# Patient Record
Sex: Female | Born: 1960
Health system: Southern US, Community
[De-identification: ages and names within clinical notes are randomized; demographics above are authoritative.]

## PROBLEM LIST (undated history)

## (undated) DIAGNOSIS — E7800 Pure hypercholesterolemia, unspecified: Secondary | ICD-10-CM

## (undated) DIAGNOSIS — N289 Disorder of kidney and ureter, unspecified: Secondary | ICD-10-CM

## (undated) DIAGNOSIS — I770 Arteriovenous fistula, acquired: Secondary | ICD-10-CM

## (undated) DIAGNOSIS — K219 Gastro-esophageal reflux disease without esophagitis: Secondary | ICD-10-CM

## (undated) DIAGNOSIS — F329 Major depressive disorder, single episode, unspecified: Secondary | ICD-10-CM

## (undated) DIAGNOSIS — B009 Herpesviral infection, unspecified: Secondary | ICD-10-CM

## (undated) DIAGNOSIS — E119 Type 2 diabetes mellitus without complications: Secondary | ICD-10-CM

## (undated) DIAGNOSIS — D649 Anemia, unspecified: Secondary | ICD-10-CM

## (undated) DIAGNOSIS — E785 Hyperlipidemia, unspecified: Secondary | ICD-10-CM

## (undated) DIAGNOSIS — M1990 Unspecified osteoarthritis, unspecified site: Secondary | ICD-10-CM

## (undated) DIAGNOSIS — E1129 Type 2 diabetes mellitus with other diabetic kidney complication: Secondary | ICD-10-CM

## (undated) DIAGNOSIS — J45909 Unspecified asthma, uncomplicated: Secondary | ICD-10-CM

## (undated) DIAGNOSIS — R001 Bradycardia, unspecified: Secondary | ICD-10-CM

## (undated) DIAGNOSIS — M549 Dorsalgia, unspecified: Secondary | ICD-10-CM

## (undated) DIAGNOSIS — M109 Gout, unspecified: Secondary | ICD-10-CM

## (undated) DIAGNOSIS — I10 Essential (primary) hypertension: Secondary | ICD-10-CM

## (undated) DIAGNOSIS — G4730 Sleep apnea, unspecified: Secondary | ICD-10-CM

## (undated) DIAGNOSIS — E550 Rickets, active: Secondary | ICD-10-CM

## (undated) DIAGNOSIS — F1990 Other psychoactive substance use, unspecified, uncomplicated: Secondary | ICD-10-CM

## (undated) DIAGNOSIS — R002 Palpitations: Secondary | ICD-10-CM

## (undated) DIAGNOSIS — R0602 Shortness of breath: Secondary | ICD-10-CM

## (undated) DIAGNOSIS — J189 Pneumonia, unspecified organism: Secondary | ICD-10-CM

## (undated) DIAGNOSIS — M2550 Pain in unspecified joint: Secondary | ICD-10-CM

## (undated) DIAGNOSIS — N185 Chronic kidney disease, stage 5: Secondary | ICD-10-CM

## (undated) DIAGNOSIS — N184 Chronic kidney disease, stage 4 (severe): Secondary | ICD-10-CM

## (undated) DIAGNOSIS — F32A Depression, unspecified: Secondary | ICD-10-CM

## (undated) DIAGNOSIS — F419 Anxiety disorder, unspecified: Secondary | ICD-10-CM

## (undated) HISTORY — PX: TUBAL LIGATION: SHX77

## (undated) HISTORY — DX: Type 2 diabetes mellitus without complications: E11.9

## (undated) HISTORY — PX: OTHER SURGICAL HISTORY: SHX169

## (undated) HISTORY — DX: Type 2 diabetes mellitus with other diabetic kidney complication: E11.29

## (undated) HISTORY — DX: Disorder of kidney and ureter, unspecified: N28.9

## (undated) HISTORY — DX: Dorsalgia, unspecified: M54.9

## (undated) HISTORY — DX: Chronic kidney disease, stage 5: N18.5

## (undated) HISTORY — DX: Rickets, active: E55.0

## (undated) HISTORY — DX: Pain in unspecified joint: M25.50

## (undated) HISTORY — DX: Arteriovenous fistula, acquired: I77.0

## (undated) HISTORY — DX: Shortness of breath: R06.02

## (undated) HISTORY — DX: Palpitations: R00.2

## (undated) HISTORY — DX: Pure hypercholesterolemia, unspecified: E78.00

## (undated) HISTORY — DX: Other psychoactive substance use, unspecified, uncomplicated: F19.90

---
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
## 1997-09-19 ENCOUNTER — Emergency Department (HOSPITAL_COMMUNITY): Admission: EM | Admit: 1997-09-19 | Discharge: 1997-09-19 | Payer: Self-pay | Admitting: Emergency Medicine

## 1998-03-11 ENCOUNTER — Emergency Department (HOSPITAL_COMMUNITY): Admission: EM | Admit: 1998-03-11 | Discharge: 1998-03-11 | Payer: Self-pay | Admitting: Emergency Medicine

## 1998-03-17 ENCOUNTER — Emergency Department (HOSPITAL_COMMUNITY): Admission: EM | Admit: 1998-03-17 | Discharge: 1998-03-17 | Payer: Self-pay | Admitting: Emergency Medicine

## 1998-10-17 ENCOUNTER — Emergency Department (HOSPITAL_COMMUNITY): Admission: EM | Admit: 1998-10-17 | Discharge: 1998-10-17 | Payer: Self-pay | Admitting: Emergency Medicine

## 1998-10-17 ENCOUNTER — Encounter: Payer: Self-pay | Admitting: Emergency Medicine

## 2002-11-09 ENCOUNTER — Ambulatory Visit (HOSPITAL_COMMUNITY): Admission: RE | Admit: 2002-11-09 | Discharge: 2002-11-09 | Payer: Self-pay | Admitting: Cardiology

## 2002-11-09 ENCOUNTER — Encounter: Payer: Self-pay | Admitting: Cardiology

## 2002-11-17 ENCOUNTER — Encounter: Payer: Self-pay | Admitting: Cardiology

## 2002-11-17 ENCOUNTER — Ambulatory Visit (HOSPITAL_COMMUNITY): Admission: RE | Admit: 2002-11-17 | Discharge: 2002-11-17 | Payer: Self-pay | Admitting: Cardiology

## 2004-04-11 ENCOUNTER — Emergency Department (HOSPITAL_COMMUNITY): Admission: EM | Admit: 2004-04-11 | Discharge: 2004-04-11 | Payer: Self-pay | Admitting: Family Medicine

## 2006-01-19 ENCOUNTER — Emergency Department (HOSPITAL_COMMUNITY): Admission: EM | Admit: 2006-01-19 | Discharge: 2006-01-19 | Payer: Self-pay | Admitting: Emergency Medicine

## 2006-12-11 ENCOUNTER — Ambulatory Visit (HOSPITAL_COMMUNITY): Admission: RE | Admit: 2006-12-11 | Discharge: 2006-12-11 | Payer: Self-pay | Admitting: Obstetrics and Gynecology

## 2010-05-05 ENCOUNTER — Encounter: Payer: Self-pay | Admitting: Obstetrics & Gynecology

## 2010-08-30 NOTE — Cardiovascular Report (Signed)
NAMEHADLEIGH, Margaret Aguilar                            ACCOUNT NO.:  [id number]   MEDICAL RECORD NO.:  [id number]                   PATIENT TYPE:  OIB   LOCATION:  2899                                 FACILITY:  [facility]   PHYSICIAN:  Allegra Lai. [name], M.D.              DATE OF BIRTH:  [birth year]   DATE OF PROCEDURE:  11/17/2002  DATE OF DISCHARGE:                              CARDIAC CATHETERIZATION   PROCEDURE:  1. Left cardiac catheterization.  2. Selective left and right coronary angiography.  3. Left ventriculography via right groin using Judkins technique.   INDICATIONS FOR PROCEDURE:  Ms. Napoletano is a 50 year old black female with  past medical history significant for hypertension, morbid obesity,  complaints of retrosternal squeezing chest pain when under stress associated  with nausea and feeling weak.  Also complains of exertional dyspnea with  minimal exertion.  Denies any PND, orthopnea, leg swelling.  Denies  palpitations, lightheadedness, or syncope.  The patient underwent Persantine  Cardiolite on July 28 which showed anteroseptal wall ischemia with EF of  52%.   PAST MEDICAL HISTORY:  As above.   PAST SURGICAL HISTORY:  She had a cesarean section approximately 13 years  ago with pelvic inflammatory disease at the age of 50.  Had questionable  tonsillar abscess at the age of 20.   MEDICATIONS:  1. Toprol 50 mg p.o. daily.  2. Lotrel 5/20 mg p.o. daily.  3. Baby aspirin 81 mg p.o. daily.   ALLERGIES:  LATEX.   SOCIAL HISTORY:  She is married.  Has three children.  No history of alcohol  abuse.  States she comes in contact with passive smoking.  She works as a  Automotive engineer.  Now goes to nursing school at [employer].   FAMILY HISTORY:  Father is alive.  He is 61.  Had coronary artery bypass  grafting at the age of 28.  Mother is diabetic.  She is alive.  Brother is  obese.  One sister is hypertensive.   PHYSICAL EXAMINATION:  GENERAL:   She was alert, oriented, awake x3 in no  acute distress.  VITAL SIGNS:  Blood pressure 160/110, pulse 64, regular.  HEENT:  Conjunctiva was pink.  NECK:  Supple.  No JVD.  No bruit.  LUNGS:  Clear to auscultation without rhonchi, rales.  CARDIOVASCULAR:  S1, S2 was normal.  There was a soft S4 gallop.  ABDOMEN:  Soft.  Bowel sounds were present, nontender.  There was no  abdominal bruit.  EXTREMITIES:  No clubbing, cyanosis, edema.   IMPRESSION:  1. New onset angina.  2. Positive Persantine Cardiolite.  3. Uncontrolled hypertension.  4. Morbid obesity.  5. Hypercholesterolemia.  6. History of passive smoking.  7. Positive family history of coronary artery disease.   Discussed with patient regarding results of stress Cardiolite and left  catheterization, possible PTCA/stenting, its risks death, MI, stroke, need  for emergency CABG, as well as restenosis, local vascular complications,  etc. and consented for PCI.   PROCEDURE:  After obtaining informed consent patient was brought to the  catheterization laboratory and was placed on fluoroscopy table.  Right groin  was prepped and draped in usual fashion.  2% Xylocaine was used for local  anesthesia in right groin.  With the help of thin wall needle 6-French  arterial sheath was placed.  The sheath was aspirated and flushed.  Next, 6-  French left Judkins catheter was advanced over the wire under fluoroscopic  guidance up to the ascending aorta where it was pulled out.  The catheter  was aspirated and connected to the manifold.  Catheter was further advanced  and engaged into left coronary ostium.  Multiple views of the left system  were taken.  Next, the catheter was disengaged and was pulled out over the  wire and was replaced with a 6-French right Judkins catheter which was  advanced over the wire under fluoroscopic guidance up to the ascending aorta  where it was pulled out.  The catheter was aspirated and connected to the   manifold.  Catheter was further advanced and engaged into the right coronary  ostium.  Multiple views of the right system were taken.  Next, the catheter  was disengaged and was pulled out over the wire and was replaced with 6-  French pigtail catheter which was advanced over the wire under fluoroscopic  guidance up to the ascending aorta where it was pulled out.  The catheter  was aspirated and connected to the manifold.  Catheter was further advanced  across the aortic valve into the LV.  LV pressures were recorded.  Next, LV  graph was done in 30 degree RAO position.  Post angiographic pressures were  recorded from LV and then pullback pressures recorded from aorta.  There was  no gradient across the aortic valve.  Next, the pigtail catheter was pulled  out over the wire.  Sheaths were aspirated and flushed.   FINDINGS:  LV showed good LV systolic function, LVH, EF of 60-65%.  Left  main was patent.  LAD has 10% ostial stenosis and 10-20% mid and distal  stenosis.  Diagonal 1 is large which is patent.  Left circumflex is large  which is patent.  OM 1 is very, very small which is less than 1 mm.  OM 2 is  large which is patent which gives off one branch in the mid portion,  therefore it bifurcates into superior and inferior branch.  RCA is small  which is nondominant which is patent.  The patient has left dominant system.  Arteriotomy was closed with Perclose without complications.  The patient  tolerated procedure well and was transferred to recovery room in stable  condition.                                               Allegra Lai. [name], M.D.    MNH/MEDQ  D:  11/17/2002  T:  11/17/2002  Job:  [id number]   cc:   Cath Lab

## 2011-06-05 DIAGNOSIS — M1990 Unspecified osteoarthritis, unspecified site: Secondary | ICD-10-CM | POA: Insufficient documentation

## 2011-06-05 DIAGNOSIS — K219 Gastro-esophageal reflux disease without esophagitis: Secondary | ICD-10-CM | POA: Insufficient documentation

## 2011-06-05 DIAGNOSIS — G4733 Obstructive sleep apnea (adult) (pediatric): Secondary | ICD-10-CM | POA: Insufficient documentation

## 2011-06-05 DIAGNOSIS — Z9989 Dependence on other enabling machines and devices: Secondary | ICD-10-CM | POA: Insufficient documentation

## 2011-06-05 DIAGNOSIS — E785 Hyperlipidemia, unspecified: Secondary | ICD-10-CM | POA: Insufficient documentation

## 2011-07-03 ENCOUNTER — Other Ambulatory Visit: Payer: Self-pay | Admitting: Cardiology

## 2011-07-10 ENCOUNTER — Other Ambulatory Visit: Payer: Self-pay | Admitting: Cardiology

## 2012-05-06 ENCOUNTER — Emergency Department (INDEPENDENT_AMBULATORY_CARE_PROVIDER_SITE_OTHER): Payer: Self-pay

## 2012-05-06 ENCOUNTER — Encounter (HOSPITAL_COMMUNITY): Payer: Self-pay | Admitting: *Deleted

## 2012-05-06 ENCOUNTER — Emergency Department (INDEPENDENT_AMBULATORY_CARE_PROVIDER_SITE_OTHER)
Admission: EM | Admit: 2012-05-06 | Discharge: 2012-05-06 | Disposition: A | Discharge status: Home / Self Care | Payer: Self-pay | Source: Home / Self Care | Attending: Emergency Medicine | Admitting: Emergency Medicine

## 2012-05-06 DIAGNOSIS — S335XXA Sprain of ligaments of lumbar spine, initial encounter: Secondary | ICD-10-CM

## 2012-05-06 DIAGNOSIS — S63609A Unspecified sprain of unspecified thumb, initial encounter: Secondary | ICD-10-CM

## 2012-05-06 DIAGNOSIS — S6390XA Sprain of unspecified part of unspecified wrist and hand, initial encounter: Secondary | ICD-10-CM

## 2012-05-06 HISTORY — DX: Type 2 diabetes mellitus without complications: E11.9

## 2012-05-06 HISTORY — DX: Essential (primary) hypertension: I10

## 2012-05-06 MED ORDER — NAPROXEN 500 MG PO TABS: 500.0000 mg | ORAL_TABLET | Freq: Two times a day (BID) | ORAL | Status: DC

## 2012-05-06 MED ORDER — CYCLOBENZAPRINE HCL 5 MG PO TABS: 5.0000 mg | ORAL_TABLET | Freq: Three times a day (TID) | ORAL | Status: DC | PRN
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
## 2012-11-30 ENCOUNTER — Other Ambulatory Visit: Payer: Self-pay | Admitting: Adult Health

## 2012-12-10 ENCOUNTER — Other Ambulatory Visit: Payer: Self-pay | Admitting: Adult Health

## 2013-09-12 ENCOUNTER — Other Ambulatory Visit (HOSPITAL_COMMUNITY): Payer: Self-pay | Admitting: Nephrology

## 2013-09-12 DIAGNOSIS — N289 Disorder of kidney and ureter, unspecified: Secondary | ICD-10-CM

## 2013-09-23 ENCOUNTER — Ambulatory Visit (HOSPITAL_COMMUNITY)
Admission: RE | Admit: 2013-09-23 | Discharge: 2013-09-23 | Disposition: A | Discharge status: Home / Self Care | Payer: BC Managed Care – PPO | Source: Ambulatory Visit | Attending: Nephrology | Admitting: Nephrology

## 2013-09-23 DIAGNOSIS — N1330 Unspecified hydronephrosis: Secondary | ICD-10-CM | POA: Insufficient documentation

## 2013-09-23 DIAGNOSIS — Q619 Cystic kidney disease, unspecified: Secondary | ICD-10-CM | POA: Insufficient documentation

## 2013-09-23 DIAGNOSIS — N289 Disorder of kidney and ureter, unspecified: Secondary | ICD-10-CM

## 2013-09-28 ENCOUNTER — Ambulatory Visit (INDEPENDENT_AMBULATORY_CARE_PROVIDER_SITE_OTHER): Payer: BC Managed Care – PPO | Admitting: Family Medicine

## 2013-09-28 VITALS — BP 124/82 | HR 70 | Temp 97.8°F | Resp 18 | Ht 61.0 in | Wt 258.0 lb

## 2013-09-28 DIAGNOSIS — N289 Disorder of kidney and ureter, unspecified: Secondary | ICD-10-CM

## 2013-09-28 DIAGNOSIS — Z9109 Other allergy status, other than to drugs and biological substances: Secondary | ICD-10-CM

## 2013-09-28 DIAGNOSIS — Z0000 Encounter for general adult medical examination without abnormal findings: Secondary | ICD-10-CM

## 2013-09-28 DIAGNOSIS — E119 Type 2 diabetes mellitus without complications: Secondary | ICD-10-CM

## 2013-09-28 DIAGNOSIS — R059 Cough, unspecified: Secondary | ICD-10-CM

## 2013-09-28 DIAGNOSIS — I10 Essential (primary) hypertension: Secondary | ICD-10-CM | POA: Insufficient documentation

## 2013-09-28 DIAGNOSIS — R05 Cough: Secondary | ICD-10-CM

## 2013-09-28 MED ORDER — FLUTICASONE PROPIONATE 50 MCG/ACT NA SUSP: 2.0000 | Freq: Every day | NASAL | Status: DC

## 2013-09-28 MED ORDER — ALBUTEROL SULFATE HFA 108 (90 BASE) MCG/ACT IN AERS: 2.0000 | INHALATION_SPRAY | Freq: Four times a day (QID) | RESPIRATORY_TRACT | Status: DC | PRN

## 2013-09-28 NOTE — Patient Instructions (Signed)

## 2013-09-28 NOTE — Progress Notes (Signed)
Patient ID: Margaret Aguilar MRN: [id number], DOB: 1960/06/04, 53 y.o. Date of Encounter: 09/28/2013, 3:09 PM  Primary Physician: Clent Demark, MD  Chief Complaint: Physical (CPE)  HPI: 54 y.o. y/o female with history of noted below here for CPE.   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  LMP: last week (first one in a year) Pap:  January MMG:  January Last Td: needed today Review of Systems: Consitutional: No fever, chills, fatigue, night sweats, lymphadenopathy, or weight changes. Eyes: No visual changes, eye redness, or discharge. ENT/Mouth: Ears: No otalgia, tinnitus, hearing loss, discharge. Nose:  Congestion present with rhinorrhea, sinus pain, but no epistaxis. Throat: No sore throat, post nasal drip, or teeth pain. Cardiovascular: No CP, palpitations, diaphoresis, DOE, edema, orthopnea, PND. Respiratory:  Cough, but no hemoptysis, SOB, or wheezing. Gastrointestinal: No anorexia, dysphagia, reflux, pain, nausea, hematemesis, diarrhea, constipation, BRBPR, or melena. Breast: No discharge, pain, swelling, or mass.  Vomiting yesterday. Genitourinary: No dysuria, frequency, urgency, hematuria, incontinence, nocturia, amenorrhea, vaginal discharge, pruritis, burning, abnormal bleeding, or pain. Musculoskeletal: No decreased ROM, myalgias, stiffness, joint swelling, or weakness. Skin: No rash, erythema, lesion changes, pain, warmth, jaundice, or pruritis. Neurological: No headache, dizziness, syncope, seizures, tremors, memory loss, coordination problems, or paresthesias. Psychological: No anxiety, depression, hallucinations, SI/HI. Endocrine: No fatigue, polydipsia, polyphagia, polyuria, or known diabetes. All other systems were reviewed and are otherwise negative.  Past Medical History  Diagnosis Date  . Diabetes mellitus without complication   . Hypertension      History reviewed. No pertinent past surgical history.  Home Meds:  Prior to Admission medications     Medication Sig Start Date End Date Taking? Authorizing Provider  amLODipine-benazepril (LOTREL) 10-20 MG per capsule Take 1 capsule by mouth daily.   Yes Historical Provider, MD  glimepiride (AMARYL) 1 MG tablet Take 1 mg by mouth daily before breakfast.   Yes Historical Provider, MD  metoprolol succinate (TOPROL-XL) 100 MG 24 hr tablet Take 100 mg by mouth daily. Take with or immediately following a meal.   Yes Historical Provider, MD    Allergies:  Allergies  Allergen Reactions  . Nutritional Supplements     walnut  . Latex     History   Social History  . Marital Status: Married    Spouse Name: N/A    Number of Children: N/A  . Years of Education: N/A   Occupational History  . Not on file.   Social History Main Topics  . Smoking status: Never Smoker   . Smokeless tobacco: Not on file  . Alcohol Use: No  . Drug Use: No  . Sexual Activity: No   Other Topics Concern  . Not on file   Social History Narrative  . No narrative on file    Family History  Problem Relation Age of Onset  . Diabetes Mother   . Hyperlipidemia Mother   . Hyperlipidemia Father   . Hypertension Father   . Stroke Brother   . Diabetes Brother     Physical Exam: Blood pressure 124/82, pulse 70, temperature 97.8 F (36.6 C), temperature source Oral, resp. rate 18, height 5\' 1"  (1.549 m), weight 258 lb (117.028 kg), SpO2 96.00%., Body mass index is 48.77 kg/(m^2). Wt Readings from Last 3 Encounters:  09/28/13 258 lb (117.028 kg)   BP Readings from Last 3 Encounters:  09/28/13 124/82  05/06/12 199/95   General: Well developed, well nourished, in no acute distress. HEENT: Normocephalic, atraumatic. Conjunctiva pink, sclera non-icteric. Pupils 2  mm constricting to 1 mm, round, regular, and equally reactive to light and accomodation. EOMI. Internal auditory canal clear. TMs with good cone of light and without pathology. Nasal mucosa pink. Nares are without discharge. No sinus tenderness. Oral  mucosa pink. Dentition fair condition. Pharynx without exudate.   Neck: Supple. Trachea midline. No thyromegaly. Full ROM. No lymphadenopathy. Lungs: Clear to auscultation bilaterally without wheezes, rales, or rhonchi. Breathing is of normal effort and unlabored. Cardiovascular: RRR with S1 S2. No murmurs, rubs, or gallops appreciated. Distal pulses 2+ symmetrically. No carotid or abdominal bruits. Abdomen: Soft, non-tender, non-distended with normoactive bowel sounds. No hepatosplenomegaly or masses. No rebound/guarding. No CVA tenderness. Without hernias.  Musculoskeletal: Full range of motion and 5/5 strength throughout. Without swelling, atrophy, tenderness, crepitus, or warmth. Extremities without clubbing, cyanosis, or edema. Calves supple. Skin: Warm and moist without erythema, ecchymosis, wounds, or rash. Neuro: A+Ox3. CN II-XII grossly intact. Moves all extremities spontaneously. Full sensation throughout. Normal gait. DTR 2+ throughout upper and lower extremities. Finger to nose intact. Psych:  Responds to questions appropriately with a normal affect.   No results found for this basename: CHOL, HDL, LDLCALC, LDLDIRECT, TRIG, CHOLHDL    Assessment/Plan:  53 y.o. y/o female here for CPE for school.  She recently had blood work done. She had eyes checked in January Colonoscopy is only preventative pending, patient needs to finish school. Needs Tb test. -  Signed, Robyn Haber, MD 09/28/2013 3:09 PM    Tuberculosis Risk Questionnaire  1. No Were you born outside the [place] in one of the following parts of the world: [place], [place], [place], [place] or [place]?    2. No Have you traveled outside the [place] and lived for more than one month in one of the following parts of the world: [place], [place], [place], [place] or [place]?    3. No Do you have a compromised immune system such as from any of the following conditions:HIV/AIDS, organ or bone  marrow transplantation, diabetes, immunosuppressive medicines (e.g. Prednisone, Remicaide), leukemia, lymphoma, cancer of the head or neck, gastrectomy or jejunal bypass, end-stage renal disease (on dialysis), or silicosis?     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    5. No Have you ever: injected illegal drugs, used crack cocaine, lived in a homeless shelter  or been in jail or prison?     6. No Have you ever been exposed to anyone with infectious tuberculosis?    Tuberculosis Symptom Questionnaire  Do you currently have any of the following symptoms?  1. No Unexplained cough lasting more than 3 weeks?   2. No Unexplained fever lasting more than 3 weeks.   3. No Night Sweats (sweating that leaves the bedclothes and sheets wet)     4. No Shortness of Breath   5. No Chest Pain   6. No Unintentional weight loss    7. No Unexplained fatigue (very tired for no reason)

## 2013-09-30 ENCOUNTER — Ambulatory Visit (INDEPENDENT_AMBULATORY_CARE_PROVIDER_SITE_OTHER): Payer: BC Managed Care – PPO | Admitting: Physician Assistant

## 2013-09-30 DIAGNOSIS — Z09 Encounter for follow-up examination after completed treatment for conditions other than malignant neoplasm: Secondary | ICD-10-CM

## 2013-09-30 LAB — TB SKIN TEST
Induration: 0 mm
TB Skin Test: NEGATIVE

## 2013-09-30 NOTE — Progress Notes (Signed)
Patient here for TB read only. Not seen by a provider.

## 2013-10-06 ENCOUNTER — Other Ambulatory Visit (HOSPITAL_COMMUNITY): Payer: Self-pay

## 2013-10-06 DIAGNOSIS — G4730 Sleep apnea, unspecified: Secondary | ICD-10-CM

## 2013-10-12 ENCOUNTER — Ambulatory Visit (INDEPENDENT_AMBULATORY_CARE_PROVIDER_SITE_OTHER): Payer: BC Managed Care – PPO | Admitting: *Deleted

## 2013-10-12 DIAGNOSIS — Z09 Encounter for follow-up examination after completed treatment for conditions other than malignant neoplasm: Secondary | ICD-10-CM

## 2013-10-12 DIAGNOSIS — Z111 Encounter for screening for respiratory tuberculosis: Secondary | ICD-10-CM

## 2013-10-12 NOTE — Addendum Note (Signed)
Addended byGrant Fontana R on: 10/12/2013 01:03 PM   Modules accepted: Level of Service

## 2013-10-12 NOTE — Progress Notes (Signed)
   Subjective:    Patient ID: Margaret Aguilar, female    DOB: [birth year], 53 y.o.   MRN: [id number]  HPI Pt here for second TB placement    Review of Systems     Objective:   Physical Exam        Assessment & Plan:

## 2013-10-15 ENCOUNTER — Ambulatory Visit (INDEPENDENT_AMBULATORY_CARE_PROVIDER_SITE_OTHER): Payer: BC Managed Care – PPO | Admitting: Physician Assistant

## 2013-10-15 DIAGNOSIS — Z09 Encounter for follow-up examination after completed treatment for conditions other than malignant neoplasm: Secondary | ICD-10-CM

## 2013-10-15 DIAGNOSIS — Z111 Encounter for screening for respiratory tuberculosis: Secondary | ICD-10-CM

## 2013-10-15 LAB — TB SKIN TEST
Induration: 0 mm
TB Skin Test: NEGATIVE

## 2013-10-15 NOTE — Progress Notes (Signed)
PPD reading was negative with an 0 mm induration.

## 2013-10-15 NOTE — Progress Notes (Deleted)
   Subjective:    Patient ID: Margaret Aguilar, female    DOB: 28-Jul-1960, 53 y.o.   MRN: [id number]  HPI    Review of Systems     Objective:   Physical Exam        Assessment & Plan:

## 2013-10-28 ENCOUNTER — Ambulatory Visit: Payer: BC Managed Care – PPO | Attending: Neurology | Admitting: Sleep Medicine

## 2013-10-28 VITALS — Ht 61.0 in | Wt 265.0 lb

## 2013-10-28 DIAGNOSIS — G4730 Sleep apnea, unspecified: Secondary | ICD-10-CM

## 2013-10-28 DIAGNOSIS — G4733 Obstructive sleep apnea (adult) (pediatric): Secondary | ICD-10-CM | POA: Insufficient documentation

## 2013-11-03 ENCOUNTER — Encounter (INDEPENDENT_AMBULATORY_CARE_PROVIDER_SITE_OTHER): Payer: Self-pay | Admitting: *Deleted

## 2013-11-09 NOTE — Sleep Study (Signed)
  [place] A. Merlene Laughter, MD     [email or web address]        NOCTURNAL POLYSOMNOGRAM    LOCATION: SLEEP LAB FACILITY: [facility]   PHYSICIAN: Amberleigh Gerken A. Merlene Laughter, M.D.   DATE OF STUDY: 10/28/2013.   REFERRING PHYSICIAN: [place].  INDICATIONS: This is a 53 year old presents with fatigue, snoring and witnessed apneas. There is also history of awakening with dyspnea and leg jerks.  MEDICATIONS:  Prior to Admission medications   Medication Sig Start Date End Date Taking? Authorizing Provider  albuterol (PROVENTIL HFA;VENTOLIN HFA) 108 (90 BASE) MCG/ACT inhaler Inhale 2 puffs into the lungs every 6 (six) hours as needed for wheezing or shortness of breath. 09/28/13   Robyn Haber, MD  amLODipine-benazepril (LOTREL) 10-20 MG per capsule Take 1 capsule by mouth daily.    Historical Provider, MD  fluticasone (FLONASE) 50 MCG/ACT nasal spray Place 2 sprays into both nostrils daily. 09/28/13   Robyn Haber, MD  glimepiride (AMARYL) 1 MG tablet Take 1 mg by mouth daily before breakfast.    Historical Provider, MD  metoprolol succinate (TOPROL-XL) 100 MG 24 hr tablet Take 100 mg by mouth daily. Take with or immediately following a meal.    Historical Provider, MD      EPWORTH SLEEPINESS SCALE: 7.   BMI: 50.   ARCHITECTURAL SUMMARY: Total recording time was 412 minutes. Sleep efficiency 80 %. Sleep latency 38 minutes. REM latency 102 minutes. Stage NI 5 %, N2 45 % and N3 31 % and REM sleep 18 %.    RESPIRATORY DATA:  Baseline oxygen saturation is 97 %. The lowest saturation is 74 %. The diagnostic AHI is 23. The RDI is 24. The REM AHI is 78.  LIMB MOVEMENT SUMMARY: PLM index 10.   ELECTROCARDIOGRAM SUMMARY: Average heart rate is 58 with no significant dysrhythmias observed.   IMPRESSION:  1. Moderate obstructive sleep apnea syndrome. A formal CPAP titration study is recommended.  Thanks for this referral.  Tobie Hellen A. Merlene Laughter, M.D. Diplomat, Tax adviser of Sleep  Medicine.

## 2013-12-07 ENCOUNTER — Institutional Professional Consult (permissible substitution) (INDEPENDENT_AMBULATORY_CARE_PROVIDER_SITE_OTHER): Payer: BC Managed Care – PPO | Admitting: Internal Medicine

## 2013-12-14 ENCOUNTER — Ambulatory Visit (INDEPENDENT_AMBULATORY_CARE_PROVIDER_SITE_OTHER): Payer: BC Managed Care – PPO | Admitting: Internal Medicine

## 2013-12-14 ENCOUNTER — Encounter (INDEPENDENT_AMBULATORY_CARE_PROVIDER_SITE_OTHER): Payer: Self-pay | Admitting: Internal Medicine

## 2013-12-14 ENCOUNTER — Other Ambulatory Visit (INDEPENDENT_AMBULATORY_CARE_PROVIDER_SITE_OTHER): Payer: Self-pay | Admitting: *Deleted

## 2013-12-14 ENCOUNTER — Telehealth (INDEPENDENT_AMBULATORY_CARE_PROVIDER_SITE_OTHER): Payer: Self-pay | Admitting: *Deleted

## 2013-12-14 VITALS — BP 156/80 | HR 60 | Temp 97.4°F | Ht 61.0 in | Wt 252.5 lb

## 2013-12-14 DIAGNOSIS — Z1211 Encounter for screening for malignant neoplasm of colon: Secondary | ICD-10-CM

## 2013-12-14 DIAGNOSIS — R195 Other fecal abnormalities: Secondary | ICD-10-CM

## 2013-12-14 DIAGNOSIS — D509 Iron deficiency anemia, unspecified: Secondary | ICD-10-CM

## 2013-12-14 MED ORDER — PEG-KCL-NACL-NASULF-NA ASC-C 100 G PO SOLR: 1.0000 | Freq: Once | ORAL | Status: DC

## 2013-12-14 NOTE — Patient Instructions (Signed)
Colonoscopy.  The risks and benefits such as perforation, bleeding, and infection were reviewed with the patient and is agreeable. 

## 2013-12-14 NOTE — Progress Notes (Signed)
   Subjective:    Patient ID: Margaret Aguilar, female    DOB: March 29, 1961, 53 y.o.   MRN: [id number]  HPI Referred by Dr. Hinda Lenis for IDA. Hx of CKD, stage 4.  Diabetic x 5 yrs.  She has never undergone a colonoscopy.  Appetite is good. No weight loss.  No abdominal pain. Usually has a BM once a day or every other day. No melena or BRRB. No change in her stools. She says her stools smell terrible which is new.  Recently started iron.  Presently going thru menopause  09/17/2013 Iron 24, TIBC 339, UIBC 311, Sat 8, H and H 11.9 and 36.7, MCV 72.2, Platelet ct 306. Glucose 106, BUN 2.65, Creatinine 4.1, Vitamin B12 796, Folate 13.6  Review of Systems Past Medical History  Diagnosis Date  . Diabetes mellitus without complication     x 5 yrs  . Hypertension   . Kidney disease     Stage 4    Past Surgical History  Procedure Laterality Date  . Cesarean section    . Tubal ligation      Allergies  Allergen Reactions  . Nutritional Supplements     walnut  . Latex     Current Outpatient Prescriptions on File Prior to Visit  Medication Sig Dispense Refill  . albuterol (PROVENTIL HFA;VENTOLIN HFA) 108 (90 BASE) MCG/ACT inhaler Inhale 2 puffs into the lungs every 6 (six) hours as needed for wheezing or shortness of breath.  1 Inhaler  11  . amLODipine-benazepril (LOTREL) 10-20 MG per capsule Take 1 capsule by mouth daily.      [name] glimepiride (AMARYL) 1 MG tablet Take 1 mg by mouth daily before breakfast.      . metoprolol succinate (TOPROL-XL) 100 MG 24 hr tablet Take 100 mg by mouth daily. Take with or immediately following a meal.       No current facility-administered medications on file prior to visit.        Objective:   Physical Exam Filed Vitals:   12/14/13 1025  BP: 156/80  Pulse: 60  Temp: 97.4 F (36.3 C)  Height: 5\' 1"  (1.549 m)  Weight: 252 lb 8 oz (114.533 kg)   Alert and oriented. Skin warm and dry. Oral mucosa is moist.   . Sclera anicteric, conjunctivae is  pink. Thyroid not enlarged. No cervical lymphadenopathy. Lungs clear. Heart regular rate and rhythm.  Abdomen is soft. Bowel sounds are positive. No hepatomegaly. No abdominal masses felt. No tenderness.  No edema to lower extremities. Stool brown and guaiac positive.       Assessment & Plan:  IDA. Guaiac positive stool. Colonic neoplasm, AVM, ulcer, polyp needs to be ruled out.  Plan: Colonoscopy.The risks and benefits such as perforation, bleeding, and infection were reviewed with the patient and is agreeable.

## 2013-12-14 NOTE — Telephone Encounter (Signed)
Patient needs movi prep 

## 2013-12-22 ENCOUNTER — Encounter (HOSPITAL_COMMUNITY): Payer: Self-pay | Admitting: Pharmacy Technician

## 2013-12-29 ENCOUNTER — Other Ambulatory Visit: Payer: Self-pay | Admitting: Nurse Practitioner

## 2013-12-29 ENCOUNTER — Ambulatory Visit
Admission: RE | Admit: 2013-12-29 | Discharge: 2013-12-29 | Disposition: A | Discharge status: Home / Self Care | Payer: BC Managed Care – PPO | Source: Ambulatory Visit | Attending: Nurse Practitioner | Admitting: Nurse Practitioner

## 2013-12-29 DIAGNOSIS — M25572 Pain in left ankle and joints of left foot: Secondary | ICD-10-CM

## 2013-12-29 DIAGNOSIS — M79675 Pain in left toe(s): Secondary | ICD-10-CM

## 2014-01-12 ENCOUNTER — Encounter (HOSPITAL_COMMUNITY)
Admission: RE | Disposition: A | Discharge status: Home / Self Care | Payer: Self-pay | Source: Ambulatory Visit | Attending: Internal Medicine

## 2014-01-12 ENCOUNTER — Encounter (HOSPITAL_COMMUNITY): Payer: Self-pay | Admitting: *Deleted

## 2014-01-12 ENCOUNTER — Ambulatory Visit (HOSPITAL_COMMUNITY)
Admission: RE | Admit: 2014-01-12 | Discharge: 2014-01-12 | Disposition: A | Discharge status: Home / Self Care | Payer: BC Managed Care – PPO | Source: Ambulatory Visit | Attending: Internal Medicine | Admitting: Internal Medicine

## 2014-01-12 DIAGNOSIS — Z91040 Latex allergy status: Secondary | ICD-10-CM | POA: Insufficient documentation

## 2014-01-12 DIAGNOSIS — I129 Hypertensive chronic kidney disease with stage 1 through stage 4 chronic kidney disease, or unspecified chronic kidney disease: Secondary | ICD-10-CM | POA: Insufficient documentation

## 2014-01-12 DIAGNOSIS — K635 Polyp of colon: Secondary | ICD-10-CM

## 2014-01-12 DIAGNOSIS — D509 Iron deficiency anemia, unspecified: Secondary | ICD-10-CM | POA: Diagnosis present

## 2014-01-12 DIAGNOSIS — K921 Melena: Secondary | ICD-10-CM

## 2014-01-12 DIAGNOSIS — E119 Type 2 diabetes mellitus without complications: Secondary | ICD-10-CM | POA: Insufficient documentation

## 2014-01-12 DIAGNOSIS — D123 Benign neoplasm of transverse colon: Secondary | ICD-10-CM | POA: Insufficient documentation

## 2014-01-12 DIAGNOSIS — N184 Chronic kidney disease, stage 4 (severe): Secondary | ICD-10-CM | POA: Diagnosis not present

## 2014-01-12 DIAGNOSIS — R195 Other fecal abnormalities: Secondary | ICD-10-CM

## 2014-01-12 DIAGNOSIS — Z91018 Allergy to other foods: Secondary | ICD-10-CM | POA: Insufficient documentation

## 2014-01-12 DIAGNOSIS — K5730 Diverticulosis of large intestine without perforation or abscess without bleeding: Secondary | ICD-10-CM

## 2014-01-12 HISTORY — PX: COLONOSCOPY: SHX5424

## 2014-01-12 SURGERY — COLONOSCOPY
Anesthesia: Moderate Sedation

## 2014-01-12 MED ORDER — MEPERIDINE HCL 50 MG/ML IJ SOLN
INTRAMUSCULAR | Status: DC
Start: 2014-01-12 — End: 2014-01-12
  Filled 2014-01-12: qty 1

## 2014-01-12 MED ORDER — SODIUM CHLORIDE 0.9 % IV SOLN
INTRAVENOUS | Status: DC
  Administered 2014-01-12: 1000 mL via INTRAVENOUS

## 2014-01-12 MED ORDER — MIDAZOLAM HCL 5 MG/5ML IJ SOLN
INTRAMUSCULAR | Status: DC | PRN
  Administered 2014-01-12: 2 mg via INTRAVENOUS
  Administered 2014-01-12: 1 mg via INTRAVENOUS
  Administered 2014-01-12 (×2): 2 mg via INTRAVENOUS

## 2014-01-12 MED ORDER — MIDAZOLAM HCL 5 MG/5ML IJ SOLN
INTRAMUSCULAR | Status: AC
  Filled 2014-01-12: qty 10

## 2014-01-12 MED ORDER — MEPERIDINE HCL 50 MG/ML IJ SOLN
INTRAMUSCULAR | Status: DC | PRN
  Administered 2014-01-12 (×3): 25 mg via INTRAVENOUS

## 2014-01-12 MED ORDER — STERILE WATER FOR IRRIGATION IR SOLN
Status: DC | PRN
  Administered 2014-01-12: 14:00:00

## 2014-01-12 NOTE — H&P (Signed)
Margaret Aguilar is an 53 y.o. female.   Chief Complaint: Patient is here for colonoscopy and possible EGD. HPI: Patient is 53 year old African female with history of diabetes mellitus and stage IV kidney disease who was found to have iron deficiency anemia and heme-positive stool. There is no history of melena rectal bleeding or abdominal pain. She has remote history of peptic ulcer disease possibly secondary to NSAIDs. She believes she has been tested for H. pylori does not know the result. Since she was found to have a kidney disease she is changing her eating habits and has lost 30 pounds this year. Family history is negative for CRC. Mother has history of colonic polyps.  Past Medical History  Diagnosis Date  . Diabetes mellitus without complication     x 5 yrs  . Hypertension   . Kidney disease     Stage 4    Past Surgical History  Procedure Laterality Date  . Cesarean section    . Tubal ligation      Family History  Problem Relation Age of Onset  . Diabetes Mother   . Hyperlipidemia Mother   . Hyperlipidemia Father   . Hypertension Father   . Stroke Brother   . Diabetes Brother    Social History:  reports that she has never smoked. She does not have any smokeless tobacco history on file. She reports that she does not drink alcohol or use illicit drugs.  Allergies:  Allergies  Allergen Reactions  . Nutritional Supplements     walnut  . Latex     Medications Prior to Admission  Medication Sig Dispense Refill  . albuterol (PROVENTIL HFA;VENTOLIN HFA) 108 (90 BASE) MCG/ACT inhaler Inhale 2 puffs into the lungs every 6 (six) hours as needed for wheezing or shortness of breath.  1 Inhaler  11  . amLODipine-benazepril (LOTREL) 10-20 MG per capsule Take 1 capsule by mouth daily.      . cholecalciferol (VITAMIN D) 1000 UNITS tablet Take 1,000 Units by mouth daily.      . ferrous sulfate 325 (65 FE) MG tablet Take by mouth daily with breakfast.      . glimepiride (AMARYL) 1 MG  tablet Take 1 mg by mouth daily before breakfast.      . hydrochlorothiazide (HYDRODIURIL) 25 MG tablet Take 25 mg by mouth daily.      . isosorbide mononitrate (IMDUR) 60 MG 24 hr tablet Take 60 mg by mouth daily.      . metoprolol succinate (TOPROL-XL) 100 MG 24 hr tablet Take 100 mg by mouth daily. Take with or immediately following a meal.      . peg 3350 powder (MOVIPREP) 100 G SOLR Take 1 kit (200 g total) by mouth once.  1 kit  0    No results found for this or any previous visit (from the past 48 hour(s)). No results found.  ROS  Blood pressure 169/84, temperature 97.8 F (36.6 C), temperature source Oral, resp. rate 22, height 5' 1.5" (1.562 m), weight 247 lb (112.038 kg), SpO2 96.00%. Physical Exam  Constitutional:  Pleasant well-developed mildly obese American female in NAD  HENT:  Mouth/Throat: Oropharynx is clear and moist.  Eyes: Conjunctivae are normal. No scleral icterus.  Neck: No thyromegaly present.  Cardiovascular: Normal rate, regular rhythm and normal heart sounds.   No murmur heard. Respiratory: Effort normal and breath sounds normal.  GI: Soft. She exhibits no distension and no mass. There is no tenderness.  Musculoskeletal: She exhibits no  edema.  Lymphadenopathy:    She has no cervical adenopathy.  Neurological: She is alert.  Skin: Skin is warm and dry.     Assessment/Plan Iron deficiency anemia. Heme positive stool. Diagnostic Colonoscopy, normal to be followed by esophagogastroduodenoscopy.  REHMAN,NAJEEB U 01/12/2014, 2:22 PM

## 2014-01-12 NOTE — Op Note (Signed)
[facility] [address] [place], [address]    COLONOSCOPY PROCEDURE REPORT     EXAM DATE: 2014-01-28  PATIENT NAME:      Margaret Aguilar, Margaret Aguilar           MR #:      [id number] BIRTHDATE:       04-10-1961      VISIT #:     [id number]  ATTENDING:     Hildred Laser, MD     STATUS:     outpatient REFERRING MD:      [name], M.D. ASA CLASS:        Class II  INDICATIONS:  The patient is  53 yr old female here for a colonoscopy due to iron deficiency anemia and heme-positive stool.  PROCEDURE PERFORMED:     Colonoscopy with snare polypectomy MEDICATIONS:     Cetacaine spray for oral pharyngeal topical anesthesia, Meperidine (Demerol) 75 mg IV, and Versed 7 mg IV  ESTIMATED BLOOD LOSS:     None  CONSENT: The patient understands the risks and benefits of the procedure and understands that these risks include, but are not limited to: sedation, allergic reaction, infection, perforation and/or bleeding. Alternative means of evaluation and treatment include, among others: physical exam, x-rays, and/or surgical intervention. The patient elects to proceed with this endoscopic procedure.  DESCRIPTION OF PROCEDURE: During intra-op preparation period all mechanical & medical equipment was checked for proper function. Hand hygiene and appropriate measures for infection prevention was taken. After the risks, benefits and alternatives of the procedure were thoroughly explained, Informed consent was verified, confirmed and timeout was successfully executed by the treatment team. A digital exam revealed no abnormalities of the rectum.      The [id number]) endoscope was introduced through the anus and advanced to the cecum, which was identified by the ileocecal valve. No adverse events experienced. The prep was excellent.. The instrument was then slowly withdrawn as the colon was fully examined.   COLON FINDINGS: A pedunculated polyp measuring 15 mm in size with  a friable surface was found at the hepatic flexure.  A polypectomy was performed using snare cautery.  The resection was complete, the polyp tissue was completely retrieved and sent to histology. 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     The scope was then completely withdrawn from the patient and the procedure terminated.  WITHDRAWAL TIME: 8 minutes 0 seconds    ADVERSE EVENTS:      There were no immediate complications.  IMPRESSIONS:     1.  15 mm  pedunculated polyp was found at the hepatic flexure; polypectomy was performed using snare cautery 2.  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  area behind the ileocecal valve not seen.  RECOMMENDATIONS:     1.  Hold aspirin, aspirin products, and anti-inflammatory medication for 1 week. 2.  Await biopsy results 3.  virtual colonoscopy in 4 weeks. RECALL:     Return in 5 years for Colonoscopy.  Hildred Laser, MD eSigned:  Hildred Laser, MD 28-Jan-2014 3:33 PM   cc:  CPT CODES: ICD CODES:  The ICD and CPT codes recommended by this software are interpretations from the data that the clinical staff has captured with the software.  The verification of the translation of this report to the ICD and CPT codes and modifiers is the sole responsibility of the health care institution and practicing physician where this report was generated.  [employer] will not be held  responsible for the validity of the ICD and CPT codes included on this report.  AMA assumes no liability for data contained or not contained herein. CPT is a Designer, television/film set of the [employer].   PATIENT NAME:  Margaret Aguilar, Margaret Aguilar MR#: [id number]

## 2014-01-12 NOTE — Discharge Instructions (Signed)
Colonoscopy, Care After Refer to this sheet in the next few weeks. These instructions provide you with information on caring for yourself after your procedure. Your health care provider may also give you more specific instructions. Your treatment has been planned according to current medical practices, but problems sometimes occur. Call your health care provider if you have any problems or questions after your procedure. WHAT TO EXPECT AFTER THE PROCEDURE  After your procedure, it is typical to have the following:  A small amount of blood in your stool.  Moderate amounts of gas and mild abdominal cramping or bloating. HOME CARE INSTRUCTIONS  Do not drive, operate machinery, or sign important documents for 24 hours.  You may shower and resume your regular physical activities, but move at a slower pace for the first 24 hours.  Take frequent rest periods for the first 24 hours.  Walk around or put a warm pack on your abdomen to help reduce abdominal cramping and bloating.  Drink enough fluids to keep your urine clear or pale yellow.  You may resume your normal diet as instructed by your health care provider. Avoid heavy or fried foods that are hard to digest.  Avoid drinking alcohol for 24 hours or as instructed by your health care provider.  Only take over-the-counter or prescription medicines as directed by your health care provider.  If a tissue sample (biopsy) was taken during your procedure:  Do not take aspirin or blood thinners for 7 days, or as instructed by your health care provider.  Do not drink alcohol for 7 days, or as instructed by your health care provider.  Eat soft foods for the first 24 hours. SEEK MEDICAL CARE IF: You have persistent spotting of blood in your stool 2-3 days after the procedure. SEEK IMMEDIATE MEDICAL CARE IF:  You have more than a small spotting of blood in your stool.  You pass large blood clots in your stool.  Your abdomen is swollen  (distended).  You have nausea or vomiting.  You have a fever.  You have increasing abdominal pain that is not relieved with medicine.   Colon Polyps Polyps are lumps of extra tissue growing inside the body. Polyps can grow in the large intestine (colon). Most colon polyps are noncancerous (benign). However, some colon polyps can become cancerous over time. Polyps that are larger than a pea may be harmful. To be safe, caregivers remove and test all polyps. CAUSES  Polyps form when mutations in the genes cause your cells to grow and divide even though no more tissue is needed. RISK FACTORS There are a number of risk factors that can increase your chances of getting colon polyps. They include:  Being older than 50 years.  Family history of colon polyps or colon cancer.  Long-term colon diseases, such as colitis or Crohn disease.  Being overweight.  Smoking.  Being inactive.  Drinking too much alcohol. SYMPTOMS  Most small polyps do not cause symptoms. If symptoms are present, they may include:  Blood in the stool. The stool may look dark red or black.  Constipation or diarrhea that lasts longer than 1 week. DIAGNOSIS People often do not know they have polyps until their caregiver finds them during a regular checkup. Your caregiver can use 4 tests to check for polyps:  Digital rectal exam. The caregiver wears gloves and feels inside the rectum. This test would find polyps only in the rectum.  Barium enema. The caregiver puts a liquid called barium into your  rectum before taking X-rays of your colon. Barium makes your colon look white. Polyps are dark, so they are easy to see in the X-ray pictures.  Sigmoidoscopy. A thin, flexible tube (sigmoidoscope) is placed into your rectum. The sigmoidoscope has a light and tiny camera in it. The caregiver uses the sigmoidoscope to look at the last third of your colon.  Colonoscopy. This test is like sigmoidoscopy, but the caregiver looks  at the entire colon. This is the most common method for finding and removing polyps. TREATMENT  Any polyps will be removed during a sigmoidoscopy or colonoscopy. The polyps are then tested for cancer. PREVENTION  To help lower your risk of getting more colon polyps:  Eat plenty of fruits and vegetables. Avoid eating fatty foods.  Do not smoke.  Avoid drinking alcohol.  Exercise every day.  Lose weight if recommended by your caregiver.  Eat plenty of calcium and folate. Foods that are rich in calcium include milk, cheese, and broccoli. Foods that are rich in folate include chickpeas, kidney beans, and spinach. HOME CARE INSTRUCTIONS Keep all follow-up appointments as directed by your caregiver. You may need periodic exams to check for polyps. SEEK MEDICAL CARE IF: You notice bleeding during a bowel movement.

## 2014-01-16 ENCOUNTER — Encounter (HOSPITAL_COMMUNITY): Payer: Self-pay | Admitting: Internal Medicine

## 2014-02-07 ENCOUNTER — Encounter (INDEPENDENT_AMBULATORY_CARE_PROVIDER_SITE_OTHER): Payer: Self-pay | Admitting: *Deleted

## 2014-02-07 ENCOUNTER — Other Ambulatory Visit (INDEPENDENT_AMBULATORY_CARE_PROVIDER_SITE_OTHER): Payer: Self-pay | Admitting: Internal Medicine

## 2014-02-07 DIAGNOSIS — Z86010 Personal history of colonic polyps: Secondary | ICD-10-CM

## 2014-02-07 DIAGNOSIS — D509 Iron deficiency anemia, unspecified: Secondary | ICD-10-CM

## 2014-02-07 DIAGNOSIS — R195 Other fecal abnormalities: Secondary | ICD-10-CM

## 2014-02-07 DIAGNOSIS — Z5309 Procedure and treatment not carried out because of other contraindication: Secondary | ICD-10-CM

## 2014-02-08 ENCOUNTER — Encounter (INDEPENDENT_AMBULATORY_CARE_PROVIDER_SITE_OTHER): Payer: Self-pay | Admitting: *Deleted

## 2014-02-08 NOTE — Telephone Encounter (Signed)
This encounter was created in error - please disregard.

## 2014-02-28 ENCOUNTER — Inpatient Hospital Stay: Admission: RE | Admit: 2014-02-28 | Payer: BC Managed Care – PPO | Source: Ambulatory Visit

## 2014-04-13 ENCOUNTER — Encounter (INDEPENDENT_AMBULATORY_CARE_PROVIDER_SITE_OTHER): Payer: Self-pay

## 2014-08-17 ENCOUNTER — Other Ambulatory Visit (HOSPITAL_COMMUNITY): Payer: Self-pay | Admitting: Internal Medicine

## 2014-08-17 DIAGNOSIS — N184 Chronic kidney disease, stage 4 (severe): Secondary | ICD-10-CM

## 2014-08-28 ENCOUNTER — Ambulatory Visit (HOSPITAL_COMMUNITY): Admission: RE | Admit: 2014-08-28 | Payer: BLUE CROSS/BLUE SHIELD | Source: Ambulatory Visit

## 2014-09-16 ENCOUNTER — Emergency Department (HOSPITAL_COMMUNITY)
Admission: EM | Admit: 2014-09-16 | Discharge: 2014-09-16 | Disposition: A | Discharge status: Home / Self Care | Payer: BLUE CROSS/BLUE SHIELD | Source: Home / Self Care | Attending: Emergency Medicine | Admitting: Emergency Medicine

## 2014-09-16 ENCOUNTER — Encounter (HOSPITAL_COMMUNITY): Payer: Self-pay

## 2014-09-16 DIAGNOSIS — W57XXXA Bitten or stung by nonvenomous insect and other nonvenomous arthropods, initial encounter: Secondary | ICD-10-CM | POA: Diagnosis not present

## 2014-09-16 DIAGNOSIS — T148 Other injury of unspecified body region: Secondary | ICD-10-CM | POA: Diagnosis not present

## 2014-09-16 MED ORDER — TRIAMCINOLONE ACETONIDE 0.1 % EX CREA: 1.0000 "application " | TOPICAL_CREAM | Freq: Two times a day (BID) | CUTANEOUS | Status: DC

## 2014-09-16 NOTE — ED Notes (Signed)
Insect bite-left forearm x 3 days

## 2014-09-16 NOTE — ED Provider Notes (Signed)
CSN: [id number]     Arrival date & time 09/16/14  1635 History   First MD Initiated Contact with Patient 09/16/14 1709     Chief Complaint  Patient presents with  . Insect Bite   (Consider location/radiation/quality/duration/timing/severity/associated sxs/prior Treatment) HPI Comments: 54 year old severely and morbidly obese female is complaining of itchy lesions to the left volar forearm. It started about 3 days ago. They are approximately 6-7 red annular raised lesions. This started out as small papules that has since increased in size. No other areas have been identified by the patient or seen by the examiner. Denies swelling, cough, trouble breathing, fever or systemic symptoms.   Past Medical History  Diagnosis Date  . Diabetes mellitus without complication     x 5 yrs  . Hypertension   . Kidney disease     Stage 4   Past Surgical History  Procedure Laterality Date  . Cesarean section    . Tubal ligation    . Colonoscopy N/A 01/12/2014    Procedure: COLONOSCOPY;  Surgeon: Rogene Houston, MD;  Location: [facility];  Service: Endoscopy;  Laterality: N/A;  225   Family History  Problem Relation Age of Onset  . Diabetes Mother   . Hyperlipidemia Mother   . Hyperlipidemia Father   . Hypertension Father   . Stroke Brother   . Diabetes Brother    History  Substance Use Topics  . Smoking status: Never Smoker   . Smokeless tobacco: Not on file  . Alcohol Use: No   OB History    No data available     Review of Systems  Constitutional: Negative.   HENT: Negative.   Respiratory: Negative.   Cardiovascular: Negative.   Gastrointestinal: Negative.   Skin: Positive for rash.  Neurological: Negative.   Psychiatric/Behavioral: The patient is nervous/anxious.     Allergies  Nutritional supplements and Latex  Home Medications   Prior to Admission medications   Medication Sig Start Date End Date Taking? Authorizing Provider  albuterol (PROVENTIL HFA;VENTOLIN HFA) 108  (90 BASE) MCG/ACT inhaler Inhale 2 puffs into the lungs every 6 (six) hours as needed for wheezing or shortness of breath. 09/28/13   Robyn Haber, MD  amLODipine-benazepril (LOTREL) 10-20 MG per capsule Take 1 capsule by mouth daily.    Historical Provider, MD  cholecalciferol (VITAMIN D) 1000 UNITS tablet Take 1,000 Units by mouth daily.    Historical Provider, MD  ferrous sulfate 325 (65 FE) MG tablet Take by mouth daily with breakfast.    Historical Provider, MD  glimepiride (AMARYL) 1 MG tablet Take 1 mg by mouth daily before breakfast.    Historical Provider, MD  hydrochlorothiazide (HYDRODIURIL) 25 MG tablet Take 25 mg by mouth daily. 11/24/13   Historical Provider, MD  isosorbide mononitrate (IMDUR) 60 MG 24 hr tablet Take 60 mg by mouth daily. 11/24/13   Historical Provider, MD  metoprolol succinate (TOPROL-XL) 100 MG 24 hr tablet Take 100 mg by mouth daily. Take with or immediately following a meal.    Historical Provider, MD  peg 3350 powder (MOVIPREP) 100 G SOLR Take 1 kit (200 g total) by mouth once. 12/14/13   Butch Penny, NP  triamcinolone cream (KENALOG) 0.1 % Apply 1 application topically 2 (two) times daily. 09/16/14   Janne Napoleon, NP   BP 177/120 mmHg  Pulse 70  Temp(Src) 97.4 F (36.3 C) (Oral)  SpO2 100% Physical Exam  Constitutional: She is oriented to person, place, and time. She appears  well-developed and well-nourished. No distress.  HENT:  Mouth/Throat: Oropharynx is clear and moist. No oropharyngeal exudate.  Eyes: Conjunctivae and EOM are normal.  Neck: Normal range of motion. Neck supple.  Cardiovascular: Normal rate, regular rhythm and normal heart sounds.   Pulmonary/Chest: Effort normal and breath sounds normal. No respiratory distress. She has no wheezes.  Neurological: She is alert and oriented to person, place, and time.  Skin: Skin is warm and dry. Rash noted.  Red, annular, mounded, raised, pruritic lesions numbering approximate 6-7 in a curvilinear  fashion to the left volar forearm.  Nursing note and vitals reviewed.   ED Course  Procedures (including critical care time) Labs Review Labs Reviewed - No data to display  Imaging Review No results found.   MDM   1. Insect bite    Triamcinolone cream and benadryl cream as directed. Cool compresses.     Janne Napoleon, NP 09/16/14 [phone]

## 2014-09-16 NOTE — Discharge Instructions (Signed)
Insect Bite Triamcinolone cream and benadryl cream as directed. Cool compresses. Mosquitoes, flies, fleas, bedbugs, and many other insects can bite. Insect bites are different from insect stings. A sting is when venom is injected into the skin. Some insect bites can transmit infectious diseases. SYMPTOMS  Insect bites usually turn red, swell, and itch for 2 to 4 days. They often go away on their own. TREATMENT  Your caregiver may prescribe antibiotic medicines if a bacterial infection develops in the bite. HOME CARE INSTRUCTIONS  Do not scratch the bite area.  Keep the bite area clean and dry. Wash the bite area thoroughly with soap and water.  Put ice or cool compresses on the bite area.  Put ice in a plastic bag.  Place a towel between your skin and the bag.  Leave the ice on for 20 minutes, 4 times a day for the first 2 to 3 days, or as directed.  You may apply a baking soda paste, cortisone cream, or calamine lotion to the bite area as directed by your caregiver. This can help reduce itching and swelling.  Only take over-the-counter or prescription medicines as directed by your caregiver.  If you are given antibiotics, take them as directed. Finish them even if you start to feel better. You may need a tetanus shot if:  You cannot remember when you had your last tetanus shot.  You have never had a tetanus shot.  The injury broke your skin. If you get a tetanus shot, your arm may swell, get red, and feel warm to the touch. This is common and not a problem. If you need a tetanus shot and you choose not to have one, there is a rare chance of getting tetanus. Sickness from tetanus can be serious. SEEK IMMEDIATE MEDICAL CARE IF:   You have increased pain, redness, or swelling in the bite area.  You see a red line on the skin coming from the bite.  You have a fever.  You have joint pain.  You have a headache or neck pain.  You have unusual weakness.  You have a  rash.  You have chest pain or shortness of breath.  You have abdominal pain, nausea, or vomiting.  You feel unusually tired or sleepy. MAKE SURE YOU:   Understand these instructions.  Will watch your condition.  Will get help right away if you are not doing well or get worse. Document Released: 05/08/2004 Document Revised: 06/23/2011 Document Reviewed: 10/30/2010 [employer] Patient Information 2015 [place], [place]. This information is not intended to replace advice given to you by your health care provider. Make sure you discuss any questions you have with your health care provider.  Bedbugs Bedbugs are tiny bugs that live in and around beds. During the day, they hide in mattresses and other places near beds. They come out at night and bite people lying in bed. They need blood to live and grow. Bedbugs can be found in beds anywhere. Usually, they are found in places where many people come and go (hotels, shelters, hospitals). It does not matter whether the place is dirty or clean. Getting bitten by bedbugs rarely causes a medical problem. The biggest problem can be getting rid of them. This often takes the work of a Financial risk analyst. CAUSES  Less use of pesticides. Bedbugs were common before the 1950s. Then, strong pesticides such as DDT nearly wiped them out. Today, these pesticides are not used because they harm the environment and can cause health problems.  More travel. Besides mattresses, bedbugs can also live in clothing and luggage. They can come along as people travel from place to place. Bedbugs are more common in certain parts of the world. When people travel to those areas, the bugs can come home with them.  Presence of birds and bats. Bedbugs often infest birds and bats. If you have these animals in or near your home, bedbugs may infest your house, too. SYMPTOMS It does not hurt to be bitten by a bedbug. You will probably not wake up when you are bitten. Bedbugs usually bite  areas of the skin that are not covered. Symptoms may show when you wake up, or they may take a day or more to show up. Symptoms may include:  Small red bumps on the skin. These might be lined up in a row or clustered in a group.  A darker red dot in the middle of red bumps.  Blisters on the skin. There may be swelling and very bad itching. These may be signs of an allergic reaction. This does not happen often. DIAGNOSIS Bedbug bites might look and feel like other types of insect bites. The bugs do not stay on the body like ticks or lice. They bite, drop off, and crawl away to hide. Your caregiver will probably:  Ask about your symptoms.  Ask about your recent activities and travel.  Check your skin for bedbug bites.  Ask you to check at home for signs of bedbugs. You should look for:  Spots or stains on the bed or nearby. This could be from bedbugs that were crushed or from their eggs or waste.  Bedbugs themselves. They are reddish-brown, oval, and flat. They do not fly. They are about the size of an apple seed.  Places to look for bedbugs include:  Beds. Check mattresses, headboards, box springs, and bed frames.  On drapes and curtains near the bed.  Under carpeting in the bedroom.  Behind electrical outlets.  Behind any wallpaper that is peeling.  Inside luggage. TREATMENT Most bedbug bites do not need treatment. They usually go away on their own in a few days. The bites are not dangerous. However, treatment may be needed if you have scratched so much that your skin has become infected. You may also need treatment if you are allergic to bedbug bites. Treatment options include:  A drug that stops swelling and itching (corticosteroid). Usually, a cream is rubbed on the skin. If you have a bad rash, you may be given a corticosteroid pill.  Oral antihistamines. These are pills to help control itching.  Antibiotic medicines. An antibiotic may be prescribed for infected  skin. HOME CARE INSTRUCTIONS   Take any medicine prescribed by your caregiver for your bites. Follow the directions carefully.  Consider wearing pajamas with long sleeves and pant legs.  Your bedroom may need to be treated. A pest control expert should make sure the bedbugs are gone. You may need to throw away mattresses or luggage. Ask the pest control expert what you can do to keep the bedbugs from coming back. Common suggestions include:  Putting a plastic cover over your mattress.  Washing and drying your clothes and bedding in hot water and a hot dryer. The temperature should be hotter than 120 F (48.9 C). Bedbugs are killed by high temperatures.  Vacuuming carefully all around your bed. Vacuum in all cracks and crevices where the bugs might hide. Do this often.  Carefully checking all used furniture, bedding, or  clothes that you bring into your house.  Eliminating bird nests and bat roosts.  If you get bedbug bites when traveling, check all your possessions carefully before bringing them into your house. If you find any bugs on clothes or in your luggage, consider throwing those items away. SEEK MEDICAL CARE IF:  You have red bug bites that keep coming back.  You have red bug bites that itch badly.  You have bug bites that cause a skin rash.  You have scratch marks that are red and sore. SEEK IMMEDIATE MEDICAL CARE IF: You have a fever. Document Released: 05/03/2010 Document Revised: 06/23/2011 Document Reviewed: 05/03/2010 [facility] Patient Information 2015 [place], [place]. This information is not intended to replace advice given to you by your health care provider. Make sure you discuss any questions you have with your health care provider.

## 2014-10-06 ENCOUNTER — Ambulatory Visit (HOSPITAL_COMMUNITY): Admission: RE | Admit: 2014-10-06 | Payer: BLUE CROSS/BLUE SHIELD | Source: Ambulatory Visit

## 2014-10-17 ENCOUNTER — Ambulatory Visit (HOSPITAL_COMMUNITY): Payer: BLUE CROSS/BLUE SHIELD

## 2014-11-06 ENCOUNTER — Ambulatory Visit (HOSPITAL_COMMUNITY)
Admission: RE | Admit: 2014-11-06 | Discharge: 2014-11-06 | Disposition: A | Discharge status: Home / Self Care | Payer: BLUE CROSS/BLUE SHIELD | Source: Ambulatory Visit | Attending: Internal Medicine | Admitting: Internal Medicine

## 2014-11-06 DIAGNOSIS — N184 Chronic kidney disease, stage 4 (severe): Secondary | ICD-10-CM | POA: Insufficient documentation

## 2015-09-17 ENCOUNTER — Other Ambulatory Visit: Payer: Self-pay | Admitting: Family Medicine

## 2016-06-11 ENCOUNTER — Ambulatory Visit (HOSPITAL_COMMUNITY)
Admission: EM | Admit: 2016-06-11 | Discharge: 2016-06-11 | Disposition: A | Discharge status: Home / Self Care | Payer: BLUE CROSS/BLUE SHIELD | Attending: Family Medicine | Admitting: Family Medicine

## 2016-06-11 ENCOUNTER — Encounter (HOSPITAL_COMMUNITY): Payer: Self-pay | Admitting: Emergency Medicine

## 2016-06-11 ENCOUNTER — Ambulatory Visit (INDEPENDENT_AMBULATORY_CARE_PROVIDER_SITE_OTHER): Payer: Self-pay

## 2016-06-11 DIAGNOSIS — M62830 Muscle spasm of back: Secondary | ICD-10-CM

## 2016-06-11 DIAGNOSIS — M79662 Pain in left lower leg: Secondary | ICD-10-CM

## 2016-06-11 HISTORY — DX: Chronic kidney disease, stage 4 (severe): N18.4

## 2016-06-11 MED ORDER — METHOCARBAMOL 500 MG PO TABS: 500.0000 mg | ORAL_TABLET | Freq: Three times a day (TID) | ORAL | 0 refills | Status: DC | PRN

## 2016-06-11 NOTE — ED Provider Notes (Signed)
[facility]  CSN: [id number] Arrival date & time: 06/11/16  1558   History   Chief Complaint: Left leg pain  HPI Margaret Aguilar is a 56 y.o. female presenting for left leg pain.   She's had 5 days of constant low back pain raditing down the back of the left leg to the calf noticed when trying to get out of bed in the morning. No injuries reported, though she'd recently used weights and walking on a treadmill for the first time the day before these symptoms. Pt denies any current bowel/bladder problems, fever, chills, unintentional weight loss, night time awakenings secondary to pain, weakness in one or both legs, though she has intermittent left leg numbness. She's not been to work yesterday or today due to pain. Hydrocodone has offered little improvement. Pain is worse with any standing or walking.   Past Medical History:  Diagnosis Date  . Diabetes mellitus without complication (HCC)    x 5 yrs  . Hypertension   . Kidney disease    Stage 4  . 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 IDA (iron deficiency anemia) 12/14/2013  . Guaiac + stool 12/14/2013  . Hypertension 09/28/2013  . Diabetes (HCC) 09/28/2013  . Renal insufficiency 09/28/2013  . Morbid obesity (HCC) 09/28/2013    Past Surgical History:  Procedure Laterality Date  . CESAREAN SECTION    . COLONOSCOPY N/A 01/12/2014   Procedure: COLONOSCOPY;  Surgeon: Najeeb U Rehman, MD;  Location: [facility];  Service: Endoscopy;  Laterality: N/A;  225  . TUBAL LIGATION      OB History    No data available       Home Medications    Prior to Admission medications   Medication Sig Start Date End Date Taking? Authorizing Provider  albuterol (PROVENTIL HFA;VENTOLIN HFA) 108 (90 BASE) MCG/ACT inhaler Inhale 2 puffs into the lungs every 6 (six) hours as needed for wheezing or shortness of breath. 09/28/13  Yes Kurt Lauenstein, MD    amLODipine-benazepril (LOTREL) 10-20 MG per capsule Take 1 capsule by mouth daily.   Yes Historical Provider, MD  cholecalciferol (VITAMIN D) 1000 UNITS tablet Take 1,000 Units by mouth daily.   Yes Historical Provider, MD  glimepiride (AMARYL) 1 MG tablet Take 1 mg by mouth daily before breakfast.   Yes Historical Provider, MD  hydrochlorothiazide (HYDRODIURIL) 25 MG tablet Take 25 mg by mouth daily. 11/24/13  Yes Historical Provider, MD  metoprolol succinate (TOPROL-XL) 100 MG 24 hr tablet Take 100 mg by mouth daily. Take with or immediately following a meal.   Yes Historical Provider, MD    Family History Family History  Problem Relation Age of Onset  . Diabetes Mother   . Hyperlipidemia Mother   . Hyperlipidemia Father   . Hypertension Father   . Stroke Brother   . Diabetes Brother     Social History Social History  Substance Use Topics  . Smoking status: Never Smoker  . Smokeless tobacco: Never Used  . Alcohol use No     Allergies   Nutritional supplements and Latex   Review of Systems Review of Systems Per HPI  Physical Exam Physical Exam BP 128/87 (BP Location: Left Wrist)   Pulse (!) 54   Temp 97.4 F (36.3 C) (Oral)   Resp 20   SpO2 99%  Gen: Obese well-appearing 56 y.o. female in NAD Back:  Normal   skin. Spine with scoliosis, otherwise no deformity, no step offs. No tenderness to vertebral process palpation. Lumbar paraspinous muscles spastic and tender bilaterally. Range of motion is full at neck and lumbar sacral regions. Straight leg raise is negative. Neuro:  Sensation intact bilaterally and motor function 5/5 bilateral lower extremities. No clonus at ankles.  UC Treatments / Results  Radiology No results found.  Procedures Procedures (including critical care time)  Medications Ordered in UC Medications - No data to display   Initial Impression / Assessment and Plan / UC Course  I have reviewed the triage vital signs and the nursing  notes.  Pertinent labs & imaging results that were available during my care of the patient were reviewed by me and considered in my medical decision making (see chart for details).    56 y.o. female with stage IV CKD, NIDDM, and morbid obesity presenting for low back pain radiating to left leg without neurological signs. XR performed as she initially reported decreased sensation in LLE compared to right. This demonstrated levoscoliosis, otherwise negative for spondylosis or fracture. No red flags. In the setting of recent abrupt change in activity level and paraspinal spasm, suspect muscle strain with predisposition from scoliosis. Advised to continue gradual return to activity. Will avoid steroids with DM and NSAIDs with CKD. Rx anti-spasmodic and follow up if pain persists for > 6 weeks or neurological symptoms develop.   Bradycardia noted, pt is asymptomatic and takes beta blocker.   Final Clinical Impressions(s) / UC Diagnoses   Final diagnoses:  None    New Prescriptions New Prescriptions   No medications on file     Ryan B Grunz, MD 06/11/16 1733  

## 2016-06-11 NOTE — Discharge Instructions (Signed)
Your x-ray showed that you have scoliosis which puts you at risk for muscle strains of your lower back. The muscle strain is causing your symptoms, so you should take tylenol and the muscle relaxer as needed. Symptoms should get better slowly over the next couple days. If you continue to have back pain for 6 weeks or you begin noticing leg weakness, return for care.

## 2016-06-11 NOTE — ED Triage Notes (Signed)
The patient presented to the [facility] with a complaint of left side leg pain that starts in her hip and runs down her left leg x 3 days. The patient denied any known injury but did report increased exercise recently.

## 2016-07-04 ENCOUNTER — Encounter (HOSPITAL_COMMUNITY): Payer: Self-pay | Admitting: Nurse Practitioner

## 2016-07-04 ENCOUNTER — Emergency Department (HOSPITAL_COMMUNITY): Payer: BLUE CROSS/BLUE SHIELD

## 2016-07-04 ENCOUNTER — Emergency Department (HOSPITAL_COMMUNITY)
Admission: EM | Admit: 2016-07-04 | Discharge: 2016-07-04 | Disposition: A | Discharge status: Home / Self Care | Payer: BLUE CROSS/BLUE SHIELD | Attending: Emergency Medicine | Admitting: Emergency Medicine

## 2016-07-04 DIAGNOSIS — Z79899 Other long term (current) drug therapy: Secondary | ICD-10-CM | POA: Insufficient documentation

## 2016-07-04 DIAGNOSIS — Y929 Unspecified place or not applicable: Secondary | ICD-10-CM | POA: Insufficient documentation

## 2016-07-04 DIAGNOSIS — Z91040 Latex allergy status: Secondary | ICD-10-CM | POA: Insufficient documentation

## 2016-07-04 DIAGNOSIS — W1839XA Other fall on same level, initial encounter: Secondary | ICD-10-CM | POA: Insufficient documentation

## 2016-07-04 DIAGNOSIS — E1122 Type 2 diabetes mellitus with diabetic chronic kidney disease: Secondary | ICD-10-CM | POA: Insufficient documentation

## 2016-07-04 DIAGNOSIS — Z7984 Long term (current) use of oral hypoglycemic drugs: Secondary | ICD-10-CM | POA: Insufficient documentation

## 2016-07-04 DIAGNOSIS — Y9389 Activity, other specified: Secondary | ICD-10-CM | POA: Insufficient documentation

## 2016-07-04 DIAGNOSIS — Y999 Unspecified external cause status: Secondary | ICD-10-CM | POA: Insufficient documentation

## 2016-07-04 DIAGNOSIS — I129 Hypertensive chronic kidney disease with stage 1 through stage 4 chronic kidney disease, or unspecified chronic kidney disease: Secondary | ICD-10-CM | POA: Insufficient documentation

## 2016-07-04 DIAGNOSIS — S99922A Unspecified injury of left foot, initial encounter: Secondary | ICD-10-CM | POA: Insufficient documentation

## 2016-07-04 DIAGNOSIS — N184 Chronic kidney disease, stage 4 (severe): Secondary | ICD-10-CM | POA: Insufficient documentation

## 2016-07-04 MED ORDER — OXYCODONE-ACETAMINOPHEN 5-325 MG PO TABS
1.0000 | ORAL_TABLET | Freq: Once | ORAL | Status: AC
  Administered 2016-07-04: 1 via ORAL
  Filled 2016-07-04: qty 1

## 2016-07-04 MED ORDER — OXYCODONE-ACETAMINOPHEN 5-325 MG PO TABS: 1.0000 | ORAL_TABLET | Freq: Four times a day (QID) | ORAL | 0 refills | Status: DC | PRN

## 2016-07-04 NOTE — ED Notes (Signed)
Ortho tech call to apply ASO and give crutches.

## 2016-07-04 NOTE — Progress Notes (Signed)
Orthopedic Tech Progress Note Patient Details:  Margaret Aguilar [birth year] [id number]  Ortho Devices Type of Ortho Device: ASO, Crutches Ortho Device/Splint Location: LLE Ortho Device/Splint Interventions: Ordered, Application   Braulio Bosch 07/04/2016, 6:17 PM

## 2016-07-04 NOTE — ED Provider Notes (Signed)
[place] DEPT Provider Note   CSN: [id number] Arrival date & time: 07/04/16  1239     History   Chief Complaint Chief Complaint  Patient presents with  . Back Pain  . Leg Pain    HPI Margaret Aguilar is a 56 y.o. female.  HPI  Pt presenting with c/o left foot pain and low back pain.  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  She states she was seen in urgent care and given robaxin and tylenol for back pain- told she had scoliosis on xray.  Since then she has continued to have pain in her left foot.  She has a lot of pain with bearing weight- she is hardly able to walk on her left foot.  The left foot remains tender and swollen after 3 weeks.  She states she has some back pain but this is only after standing for long periods.  No weakness of legs- but a lot of pain with trying to move the left foot.  She has had difficulty making it to the bathroom in time due to foot pain and has had to use a bedpan at home.  Denies knee pain.  There are no other associated systemic symptoms, there are no other alleviating or modifying factors.   Past Medical History:  Diagnosis Date  . Diabetes mellitus without complication ([place])    x 5 yrs  . Hypertension   . Kidney disease    Stage 4  . 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 IDA (iron deficiency anemia) 12/14/2013  . Guaiac + stool 12/14/2013  . Hypertension 09/28/2013  . Diabetes ([place]) 09/28/2013  . Renal insufficiency 09/28/2013  . Morbid obesity ([place]) 09/28/2013    Past Surgical History:  Procedure Laterality Date  . CESAREAN SECTION    . COLONOSCOPY N/A 01/12/2014   Procedure: COLONOSCOPY;  Surgeon: Rogene Houston, MD;  Location: [facility];  Service: Endoscopy;  Laterality: N/A;  225  . TUBAL LIGATION      OB History    No data available        Home Medications    Prior to Admission medications   Medication Sig Start Date End Date Taking? Authorizing Provider  albuterol (PROVENTIL HFA;VENTOLIN HFA) 108 (90 BASE) MCG/ACT inhaler Inhale 2 puffs into the lungs every 6 (six) hours as needed for wheezing or shortness of breath. 09/28/13   Robyn Haber, MD  amLODipine-benazepril (LOTREL) 10-20 MG per capsule Take 1 capsule by mouth daily.    Historical Provider, MD  cholecalciferol (VITAMIN D) 1000 UNITS tablet Take 1,000 Units by mouth daily.    Historical Provider, MD  glimepiride (AMARYL) 1 MG tablet Take 1 mg by mouth daily before breakfast.    Historical Provider, MD  hydrochlorothiazide (HYDRODIURIL) 25 MG tablet Take 25 mg by mouth daily. 11/24/13   Historical Provider, MD  methocarbamol (ROBAXIN) 500 MG tablet Take 1 tablet (500 mg total) by mouth every 8 (eight) hours as needed for muscle spasms. 06/11/16   Patrecia Pour, MD  metoprolol succinate (TOPROL-XL) 100 MG 24 hr tablet Take 100 mg by mouth daily. Take with or immediately following a meal.    Historical Provider, MD  oxyCODONE-acetaminophen (PERCOCET/ROXICET) 5-325 MG tablet Take 1-2 tablets by mouth every 6 (six) hours as needed  for severe pain. 07/04/16   Alfonzo Beers, MD    Family History Family History  Problem Relation Age of Onset  . Diabetes Mother   . Hyperlipidemia Mother   . Hyperlipidemia Father   . Hypertension Father   . Stroke Brother   . Diabetes Brother     Social History Social History  Substance Use Topics  . Smoking status: Never Smoker  . Smokeless tobacco: Never Used  . Alcohol use No     Allergies   Nutritional supplements and Latex   Review of Systems Review of Systems  ROS reviewed and all otherwise negative except for mentioned in HPI   Physical Exam Updated Vital Signs BP (!) 145/99   Pulse 62   Temp 98.5 F (36.9 C) (Oral)   Resp 18   SpO2 98%  Vitals reviewed Physical Exam Physical Examination: General  appearance - alert, well appearing, and in no distress Mental status - alert, oriented to person, place, and time Eyes - no conjunctival injection, no scleral icterus Chest - clear to auscultation, no wheezes, rales or rhonchi, symmetric air entry Heart - normal rate, regular rhythm, normal S1, S2, no murmurs, rubs, clicks or gallops Abdomen - soft, nontender, nondistended, no masses or organomegaly Back exam - no midline tenderness to palpation, mild left paraspinal tenderness to palpation, no CVA tenderness Neurological - alert, oriented x 3, strength in lower extremities 5/5 limited somewhat by pain in left foot, sensation intact in lower extremities Musculoskeletal - ttp over left lateral malleolus and left dorsum of foot, otherwise no joint tenderness, deformity or swelling Extremities - peripheral pulses normal, no pedal edema, no clubbing or cyanosis Skin - normal coloration and turgor, no rashes  ED Treatments / Results  Labs (all labs ordered are listed, but only abnormal results are displayed) Labs Reviewed - No data to display  EKG  EKG Interpretation None       Radiology Dg Foot Complete Left  Result Date: 07/04/2016 CLINICAL DATA:  Fall. EXAM: LEFT FOOT - COMPLETE 3+ VIEW COMPARISON:  12/29/2013 . FINDINGS: No acute bony or joint abnormality identified. No evidence of fracture or dislocation. Diffuse soft tissue swelling. IMPRESSION: Diffuse soft tissue swelling.  No acute bony abnormality. Electronically Signed   By: Marcello Moores  Register   On: 07/04/2016 17:03    Procedures Procedures (including critical care time)  Medications Ordered in ED Medications  oxyCODONE-acetaminophen (PERCOCET/ROXICET) 5-325 MG per tablet 1 tablet (1 tablet Oral Given 07/04/16 1646)     Initial Impression / Assessment and Plan / ED Course  I have reviewed the triage vital signs and the nursing notes.  Pertinent labs & imaging results that were available during my care of the patient were  reviewed by me and considered in my medical decision making (see chart for details).    6:25 PM pt has ASO applied by ortho tech, NVI intact toes after application- she is able to use crutches and move foot and leg appropriately now that foot is supported.  Plan for discharge with followup to orthopedics.  Discussed ice, elevation, pain control.  Discharged with strict return precautions.  Pt agreeable with plan.   Final Clinical Impressions(s) / ED Diagnoses   Final diagnoses:  Foot injury, left, initial encounter    New Prescriptions Discharge Medication List as of 07/04/2016  6:27 PM    START taking these medications   Details  oxyCODONE-acetaminophen (PERCOCET/ROXICET) 5-325 MG tablet Take 1-2 tablets by mouth every 6 (six) hours as needed for  severe pain., Starting Fri 07/04/2016, Print         Alfonzo Beers, MD 07/04/16 2136

## 2016-07-04 NOTE — Discharge Instructions (Signed)
Return to the ED with any concerns including increased pain, swelling/numbness/discoloration or any other alarming symptoms

## 2016-07-04 NOTE — ED Notes (Signed)
Ortho tech at bedside 

## 2016-07-04 NOTE — ED Triage Notes (Signed)
Pt presents with c/o back and L leg pain. The pain began 3 weeks ago after she fell. She was seen at [facility] and given robaxin and tylenol. She was told her pain was related to scoliosis, which she was unaware of having. She has tried the robaxin and tylenol with no relief. The pain has increased in severity this week and she' had 2 episodes of incontinence because she could not make it to the bathroom on time due to the pain.

## 2016-07-04 NOTE — ED Notes (Signed)
Patient transported to X-ray 

## 2016-09-18 DIAGNOSIS — E119 Type 2 diabetes mellitus without complications: Secondary | ICD-10-CM | POA: Insufficient documentation

## 2016-09-29 ENCOUNTER — Ambulatory Visit (INDEPENDENT_AMBULATORY_CARE_PROVIDER_SITE_OTHER): Payer: Self-pay | Admitting: Orthopaedic Surgery

## 2016-09-29 ENCOUNTER — Encounter (INDEPENDENT_AMBULATORY_CARE_PROVIDER_SITE_OTHER): Payer: Self-pay

## 2016-10-09 DIAGNOSIS — M47817 Spondylosis without myelopathy or radiculopathy, lumbosacral region: Secondary | ICD-10-CM | POA: Insufficient documentation

## 2016-12-23 DIAGNOSIS — M169 Osteoarthritis of hip, unspecified: Secondary | ICD-10-CM | POA: Insufficient documentation

## 2016-12-23 DIAGNOSIS — M5136 Other intervertebral disc degeneration, lumbar region: Secondary | ICD-10-CM | POA: Insufficient documentation

## 2016-12-23 DIAGNOSIS — M7062 Trochanteric bursitis, left hip: Secondary | ICD-10-CM | POA: Insufficient documentation

## 2017-01-09 DIAGNOSIS — N184 Chronic kidney disease, stage 4 (severe): Secondary | ICD-10-CM | POA: Insufficient documentation

## 2017-12-01 ENCOUNTER — Ambulatory Visit: Payer: Self-pay | Admitting: Family Medicine

## 2017-12-21 ENCOUNTER — Ambulatory Visit: Payer: Self-pay | Admitting: Family Medicine

## 2018-01-12 ENCOUNTER — Encounter: Payer: Self-pay | Admitting: Family Medicine

## 2018-01-12 ENCOUNTER — Ambulatory Visit: Payer: Self-pay | Attending: Family Medicine | Admitting: Family Medicine

## 2018-01-12 VITALS — BP 180/97 | HR 58 | Temp 98.2°F | Resp 18 | Ht 61.0 in | Wt 269.0 lb

## 2018-01-12 DIAGNOSIS — M545 Low back pain, unspecified: Secondary | ICD-10-CM

## 2018-01-12 DIAGNOSIS — E1122 Type 2 diabetes mellitus with diabetic chronic kidney disease: Secondary | ICD-10-CM | POA: Insufficient documentation

## 2018-01-12 DIAGNOSIS — I129 Hypertensive chronic kidney disease with stage 1 through stage 4 chronic kidney disease, or unspecified chronic kidney disease: Secondary | ICD-10-CM | POA: Insufficient documentation

## 2018-01-12 DIAGNOSIS — Z6841 Body Mass Index (BMI) 40.0 and over, adult: Secondary | ICD-10-CM | POA: Insufficient documentation

## 2018-01-12 DIAGNOSIS — E119 Type 2 diabetes mellitus without complications: Secondary | ICD-10-CM

## 2018-01-12 DIAGNOSIS — Z79899 Other long term (current) drug therapy: Secondary | ICD-10-CM | POA: Insufficient documentation

## 2018-01-12 DIAGNOSIS — N183 Chronic kidney disease, stage 3 unspecified: Secondary | ICD-10-CM

## 2018-01-12 DIAGNOSIS — G8929 Other chronic pain: Secondary | ICD-10-CM

## 2018-01-12 DIAGNOSIS — I10 Essential (primary) hypertension: Secondary | ICD-10-CM

## 2018-01-12 DIAGNOSIS — J4520 Mild intermittent asthma, uncomplicated: Secondary | ICD-10-CM

## 2018-01-12 DIAGNOSIS — M109 Gout, unspecified: Secondary | ICD-10-CM

## 2018-01-12 LAB — POCT GLYCOSYLATED HEMOGLOBIN (HGB A1C): HbA1c, POC (controlled diabetic range): 6.6 % (ref 0.0–7.0)

## 2018-01-12 LAB — GLUCOSE, POCT (MANUAL RESULT ENTRY): POC Glucose: 105 mg/dL — AB (ref 70–99)

## 2018-01-12 MED ORDER — GLUCOSE BLOOD VI STRP: ORAL_STRIP | 6 refills | Status: DC

## 2018-01-12 MED ORDER — HYDROCHLOROTHIAZIDE 25 MG PO TABS: 25.0000 mg | ORAL_TABLET | Freq: Every day | ORAL | 3 refills | Status: DC

## 2018-01-12 MED ORDER — METOPROLOL SUCCINATE ER 100 MG PO TB24: 100.0000 mg | ORAL_TABLET | Freq: Every day | ORAL | 5 refills | Status: DC

## 2018-01-12 MED ORDER — AMLODIPINE BESY-BENAZEPRIL HCL 10-20 MG PO CAPS: 1.0000 | ORAL_CAPSULE | Freq: Every day | ORAL | 5 refills | Status: DC

## 2018-01-12 MED ORDER — ALBUTEROL SULFATE HFA 108 (90 BASE) MCG/ACT IN AERS: 2.0000 | INHALATION_SPRAY | Freq: Four times a day (QID) | RESPIRATORY_TRACT | 11 refills | Status: DC | PRN

## 2018-01-12 MED ORDER — TRUE METRIX AIR GLUCOSE METER W/DEVICE KIT: 1.0000 | PACK | Freq: Two times a day (BID) | 0 refills | Status: DC

## 2018-01-12 MED ORDER — TRUEPLUS LANCETS 28G MISC: 6 refills | Status: DC

## 2018-01-12 MED FILL — ALBUTEROL SULFATE HFA 108 (: 108 (90 BAS | 30 days supply | Qty: 18 | Fill #0

## 2018-01-12 MED FILL — AMLODIPINE-BENAZEPRIL 10-20: 10-20 | 30 days supply | Qty: 30 | Fill #0

## 2018-01-12 MED FILL — !TRUE METRIX BLOOD GLUCOSE: 1 days supply | Qty: 1 | Fill #0

## 2018-01-12 MED FILL — METOPROLOL SUCCINATE ER 100: 100 | 30 days supply | Qty: 30 | Fill #0

## 2018-01-12 MED FILL — TRUE METRIX TEST STRIP: 50 days supply | Qty: 100 | Fill #0

## 2018-01-12 MED FILL — TRUEplus LANCETS 28G MISC: 50 days supply | Qty: 100 | Fill #0

## 2018-01-12 MED FILL — HYDROCHLOROTHIAZIDE 25 MG T: 25 | 30 days supply | Qty: 30 | Fill #0

## 2018-01-12 NOTE — Progress Notes (Signed)
Subjective:    Patient ID: Margaret Aguilar, female    DOB: May 12, 1960, 57 y.o.   MRN: [id number]  HPI 57 year old female new to the practice.  Patient reports past medical history significant for type 2 diabetes, hypertension, chronic kidney disease, gout, chronic low back pain with prior diagnosis of scoliosis and asthma.  Patient states that she was taken off of her Amaryl for treatment of her diabetes within the past 2 years as she states that she was told that her hemoglobin A1c was good.  Patient cannot recall her last hemoglobin A1c. Patient does not have a glucometer to check her blood sugars at home.   Patient also states that about 2 years ago she was working as a Technical brewer, Physiological scientist, and she came out of the room of the patient and collapsed onto the floor.  Patient states that she could not use her legs for about 7 months.  Patient states that she initially was seen at the emergency department at [facility] and they were not sure what was causing her to be unable to use her legs.  Patient states that she lived in [place] in the past and was still active at a practice associated with [employer] and her family drove her to that practice.  Patient states she was initially diagnosed with scoliosis.  Patient states that she spent about 7 months mostly bedbound.  Patient states that initially she did have some radiation of pain down her right leg but this has gone away.  Patient however has been unable to return to work.      Patient has not taken her medication for hypertension at today's visit.  Patient has had some occasional mild headaches when she has not taken her blood pressure medication.  Patient states that she is almost out of her blood pressure medicine.  Patient states that she was also diagnosed with stage III or IV chronic kidney disease.  Patient has paperwork with her from [facility] and patient had a creatinine of 2.8 in September 2018.  Patient also  reports a history of gout but states that she is not currently on any medication to treat her gout due to her renal function.  Patient denies any recent flareups of gout.  Patient did have onset within the past 2 weeks of some pain in her right knee.  Patient states her knee is no longer painful except with touching the outside portion of her left knee.  Patient also with chronic low back pain that consist of a dull aching sensation, that is about a 5 or 6 on a 0-to-10 scale and currently does not have any radiation of pain.  Patient takes Tylenol as needed for back pain.  Patient also states that she needs a new albuterol inhaler for treatment of asthma.  Patient states that her asthma is triggered by stress and environmental exposure such as smoke.  Patient denies any nighttime awakening secondary to shortness of breath or cough.  Patient does not avoid activity secondary to fear of a asthma exacerbation and patient has had no hospitalizations related to her asthma.        Patient reports family history significant for her father dying last year from a heart attack and her father also had prior stroke and hypertension.  Patient reports that her mother has diabetes.  No family history of cancer.  Patient surgical history consist of a prior D&C after miscarriage of twins, as well as a c-section and  tubal ligation.   Patient reports that she does not smoke.  Patient is married.  Patient is currently not employed secondary to her health. Past Medical History:  Diagnosis Date  . Diabetes mellitus without complication ([place])    x 5 yrs  . Hypertension   . Kidney disease    Stage 4  . 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 CESAREAN SECTION    . COLONOSCOPY N/A 01/12/2014   Procedure: COLONOSCOPY;  Surgeon: Rogene Houston, MD;  Location[facility];  Service: Endoscopy;  Laterality: N/A;  225  . TUBAL LIGATION      Family History  Problem Relation Age of Onset  . Diabetes Mother   . Hyperlipidemia Mother   . Hyperlipidemia Father   . Hypertension Father   . Stroke Brother   . Diabetes Brother    Social History   Tobacco Use  . Smoking status: Never Smoker  . Smokeless tobacco: Never Used  Substance Use Topics  . Alcohol use: No  . Drug use: No   Allergies  Allergen Reactions  . Nutritional Supplements     walnut  . Latex      Review of Systems  Constitutional: Positive for fatigue. Negative for chills and fever.  HENT: Positive for congestion. Negative for postnasal drip, rhinorrhea, sinus pressure, sinus pain, sore throat and trouble swallowing.   Respiratory: Negative for cough and shortness of breath.   Cardiovascular: Negative for chest pain, palpitations and leg swelling.  Gastrointestinal: Positive for nausea. Negative for abdominal pain and blood in stool.  Endocrine: Negative for polydipsia, polyphagia and polyuria.  Genitourinary: Negative for dysuria and frequency.  Musculoskeletal: Positive for arthralgias (Occasionally due to gout but not at today's visit) and back pain. Negative for gait problem.  Neurological: Negative for dizziness and headaches.  Hematological: Negative for adenopathy. Does not bruise/bleed easily.  Psychiatric/Behavioral: Negative for suicidal ideas. The patient is nervous/anxious.        Objective:   Physical Exam BP (!) 180/97 (BP Location: Left Arm, Patient Position: Sitting, Cuff Size: Normal)   Pulse (!) 58   Temp 98.2 F (36.8 C) (Oral)   Resp 18   Ht _0  (1.549 m)   Wt 269 lb (122 kg)   SpO2 97%   BMI 50.83 kg/m Vital signs and nurse's note reviewed General-well-nourished, well-developed obese older female in no acute distress ENT-TMs gray, nares with moderate edema of the nasal turbinates, patient with mild posterior pharynx erythema, patient with a narrowed posterior airway secondary to body habitus and large tongue  base Neck-supple, no lymphadenopathy, no thyromegaly, no carotid bruit Cardiovascular-regular rate and rhythm Abdomen-truncal obesity, soft and nontender Back- no CVA tenderness, patient with lordosis, scoliosis cannot be determined secondary to body habitus, patient with tenderness from L4-S1 area Extremities- mild, nonpitting distal lower extremity edema bilaterally Musculoskeletal- patient with left lateral knee joint line tenderness, right knee is nontender.  Both knee joints are stable. Diabetic foot exam- mild hammertoe deformities of toes 3 and 4 bilaterally.  Patient with a large, nontender bunion on the right foot.  No active skin breakdown on the feet.  Patient does have calluses on the bilateral heels as well as dry skin.  Patient with testing of 10 out of 10 areas on the foot with monofilament however patient did not sense monofilament on either heel.  Patient with 2+ bilateral dorsalis pedis pulses and 1+  posterior tibial pulses.  No significant changes in the nails and some toenails are slightly long.      Assessment & Plan:  1. Type 2 diabetes mellitus without complication, without long-term current use of insulin (HCC) Patient's hemoglobin A1c at today's visit was very good at 6.6 and patient will continue dietary control of diabetes.  Patient will also have a CMP and lipid panel at today's visit and patient was given a prescription for diabetic testing supplies.  Diabetic foot care was discussed as patient with calluses on the heels which caused absence of sensation during monofilament exam. - HgB A1c - Glucose (CBG) - Comprehensive metabolic panel - Lipid panel - Blood Glucose Monitoring Suppl (TRUE METRIX AIR GLUCOSE METER) w/Device KIT; 1 Device by Does not apply route 2 (two) times daily at 8 am and 10 pm.  Dispense: 1 kit; Refill: 0 - glucose blood (TRUE METRIX BLOOD GLUCOSE TEST) test strip; Use as instructed to check blood sugars twice per day  Dispense: 100 each; Refill:  6 - TRUEPLUS LANCETS 28G MISC; Use to check blood sugar twice per day  Dispense: 100 each; Refill: 6 - TSH  2. Essential hypertension Patient's blood pressure was highly elevated at today's visit but patient reports that she did not take her medications prior to today's visit.  Patient is given refill of her amlodipine benazepril, Toprol and hydrochlorothiazide.  Importance of compliance with blood pressure medications discussed.  Patient had stated in the exam room that she was on spironolactone as well however this medication in combination with the benazepril could cause dangerous elevations in potassium level therefore patient's hydrochlorothiazide will be continued.  Patient apparently had not mentioned the spironolactone to the CMA and Spironolactone was never placed on patient's medication list.  Patient has been asked to return to clinic in approximately 2 weeks for follow-up/blood pressure recheck with the clinical pharmacist. - Lipid panel - amLODipine-benazepril (LOTREL) 10-20 MG capsule; Take 1 capsule by mouth daily.  Dispense: 30 capsule; Refill: 5 - metoprolol succinate (TOPROL-XL) 100 MG 24 hr tablet; Take 1 tablet (100 mg total) by mouth daily. Take with or immediately following a meal.  Dispense: 30 tablet; Refill: 5 - hydrochlorothiazide (HYDRODIURIL) 25 MG tablet; Take 1 tablet (25 mg total) by mouth daily.  Dispense: 30 tablet; Refill: 3  3. Chronic midline low back pain, unspecified whether sciatica present Patient with complaint of chronic issues with low back pain.  Weight loss is encouraged and patient may take Tylenol as needed for pain but should avoid the use of nonsteroidal anti-inflammatories due to her chronic kidney disease  4. Stage 3 chronic kidney disease ([place]) Patient reports history of chronic kidney disease and does have past labs showing a creatinine of 2.8 done in September of last year.  Patient will have recheck of her creatinine level and EGFR as well as CBC  to look for anemia. - Comprehensive metabolic panel - CBC with Differential  5. Gout, unspecified cause, unspecified chronicity, unspecified site Patient reports that she has had issues with gout but cannot take medication for prevention secondary to her chronic kidney disease.  Patient reports gout flareups approximately 3-4 times per year but does not have any evidence of active gout at today's visit.  Patient will have uric acid level and low purine diet as recommended as well as remaining hydrated - Uric Acid  6. Morbid obesity ([place]) Patient will have lipid panel and TSH done in follow-up of her obesity and patient is encouraged to  remain as active as possible and to follow a low carbohydrate, low-fat diet with goal of weight loss - Lipid panel - TSH   7. Mild intermittent asthma without complication Patient reports that she has asthma which appears to be mild and intermittent.  Patient is provided with refill of her albuterol inhaler.  Patient did receive influenza immunization earlier today. - albuterol (PROVENTIL HFA;VENTOLIN HFA) 108 (90 Base) MCG/ACT inhaler; Inhale 2 puffs into the lungs every 6 (six) hours as needed for wheezing or shortness of breath.  Dispense: 1 Inhaler; Refill: 11  *Patient received her influenza immunization at the flu shot clinic conducted by pharmacy today prior to her appointment  An After Visit Summary was printed and given to the patient.  Return in about 5 weeks (around 02/16/2018) for BP-2 weeks with CPP; .

## 2018-01-13 LAB — CBC WITH DIFFERENTIAL/PLATELET
Basophils Absolute: 0 x10E3/uL (ref 0.0–0.2)
Basos: 0 %
EOS (ABSOLUTE): 0.2 x10E3/uL (ref 0.0–0.4)
Eos: 2 %
Hematocrit: 36 % (ref 34.0–46.6)
Hemoglobin: 11.1 g/dL (ref 11.1–15.9)
Immature Grans (Abs): 0 x10E3/uL (ref 0.0–0.1)
Immature Granulocytes: 0 %
Lymphocytes Absolute: 2.3 x10E3/uL (ref 0.7–3.1)
Lymphs: 26 %
MCH: 22.4 pg — ABNORMAL LOW (ref 26.6–33.0)
MCHC: 30.8 g/dL — ABNORMAL LOW (ref 31.5–35.7)
MCV: 73 fL — ABNORMAL LOW (ref 79–97)
Monocytes Absolute: 0.6 x10E3/uL (ref 0.1–0.9)
Monocytes: 6 %
Neutrophils Absolute: 5.8 x10E3/uL (ref 1.4–7.0)
Neutrophils: 66 %
Platelets: 320 x10E3/uL (ref 150–450)
RBC: 4.96 x10E6/uL (ref 3.77–5.28)
RDW: 17 % — ABNORMAL HIGH (ref 12.3–15.4)
WBC: 8.8 x10E3/uL (ref 3.4–10.8)

## 2018-01-13 LAB — TSH: TSH: 1.22 u[IU]/mL (ref 0.450–4.500)

## 2018-01-13 LAB — URIC ACID: Uric Acid: 9.5 mg/dL — ABNORMAL HIGH (ref 2.5–7.1)

## 2018-01-13 LAB — COMPREHENSIVE METABOLIC PANEL WITH GFR
ALT: 14 IU/L (ref 0–32)
AST: 23 IU/L (ref 0–40)
Albumin/Globulin Ratio: 1.3 (ref 1.2–2.2)
Albumin: 4.1 g/dL (ref 3.5–5.5)
Alkaline Phosphatase: 143 IU/L — ABNORMAL HIGH (ref 39–117)
BUN/Creatinine Ratio: 10 (ref 9–23)
BUN: 28 mg/dL — ABNORMAL HIGH (ref 6–24)
Bilirubin Total: 0.2 mg/dL (ref 0.0–1.2)
CO2: 19 mmol/L — ABNORMAL LOW (ref 20–29)
Calcium: 9.4 mg/dL (ref 8.7–10.2)
Chloride: 109 mmol/L — ABNORMAL HIGH (ref 96–106)
Creatinine, Ser: 2.84 mg/dL — ABNORMAL HIGH (ref 0.57–1.00)
GFR calc Af Amer: 21 mL/min/1.73 — ABNORMAL LOW
GFR calc non Af Amer: 18 mL/min/1.73 — ABNORMAL LOW
Globulin, Total: 3.1 g/dL (ref 1.5–4.5)
Glucose: 86 mg/dL (ref 65–99)
Potassium: 4.1 mmol/L (ref 3.5–5.2)
Sodium: 143 mmol/L (ref 134–144)
Total Protein: 7.2 g/dL (ref 6.0–8.5)

## 2018-01-13 LAB — LIPID PANEL
Chol/HDL Ratio: 5.2 ratio — ABNORMAL HIGH (ref 0.0–4.4)
Cholesterol, Total: 207 mg/dL — ABNORMAL HIGH (ref 100–199)
HDL: 40 mg/dL
LDL Calculated: 130 mg/dL — ABNORMAL HIGH (ref 0–99)
Triglycerides: 183 mg/dL — ABNORMAL HIGH (ref 0–149)
VLDL Cholesterol Cal: 37 mg/dL (ref 5–40)

## 2018-05-02 NOTE — Progress Notes (Deleted)
   Subjective:    Patient ID: Margaret Aguilar, female    DOB: [birth year], 58 y.o.   MRN: [id number]  58 y.o.F T2DM, HTN  Obesity        Review of Systems     Objective:   Physical Exam        Assessment & Plan:

## 2018-05-03 ENCOUNTER — Ambulatory Visit: Payer: Self-pay | Admitting: Critical Care Medicine

## 2018-06-23 ENCOUNTER — Other Ambulatory Visit: Payer: Self-pay

## 2018-06-23 ENCOUNTER — Emergency Department (HOSPITAL_COMMUNITY)
Admission: EM | Admit: 2018-06-23 | Discharge: 2018-06-23 | Disposition: A | Discharge status: Home / Self Care | Payer: Self-pay | Attending: Emergency Medicine | Admitting: Emergency Medicine

## 2018-06-23 ENCOUNTER — Encounter (HOSPITAL_COMMUNITY): Payer: Self-pay | Admitting: Emergency Medicine

## 2018-06-23 DIAGNOSIS — E1122 Type 2 diabetes mellitus with diabetic chronic kidney disease: Secondary | ICD-10-CM | POA: Insufficient documentation

## 2018-06-23 DIAGNOSIS — Z79899 Other long term (current) drug therapy: Secondary | ICD-10-CM | POA: Insufficient documentation

## 2018-06-23 DIAGNOSIS — K0889 Other specified disorders of teeth and supporting structures: Secondary | ICD-10-CM

## 2018-06-23 DIAGNOSIS — I493 Ventricular premature depolarization: Secondary | ICD-10-CM | POA: Insufficient documentation

## 2018-06-23 DIAGNOSIS — K029 Dental caries, unspecified: Secondary | ICD-10-CM | POA: Insufficient documentation

## 2018-06-23 DIAGNOSIS — Z7982 Long term (current) use of aspirin: Secondary | ICD-10-CM | POA: Insufficient documentation

## 2018-06-23 DIAGNOSIS — I129 Hypertensive chronic kidney disease with stage 1 through stage 4 chronic kidney disease, or unspecified chronic kidney disease: Secondary | ICD-10-CM | POA: Insufficient documentation

## 2018-06-23 DIAGNOSIS — N184 Chronic kidney disease, stage 4 (severe): Secondary | ICD-10-CM | POA: Insufficient documentation

## 2018-06-23 DIAGNOSIS — I10 Essential (primary) hypertension: Secondary | ICD-10-CM

## 2018-06-23 DIAGNOSIS — Z91040 Latex allergy status: Secondary | ICD-10-CM | POA: Insufficient documentation

## 2018-06-23 LAB — BASIC METABOLIC PANEL
Anion gap: 9 (ref 5–15)
BUN: 37 mg/dL — ABNORMAL HIGH (ref 6–20)
CHLORIDE: 109 mmol/L (ref 98–111)
CO2: 22 mmol/L (ref 22–32)
Calcium: 8.8 mg/dL — ABNORMAL LOW (ref 8.9–10.3)
Creatinine, Ser: 3.28 mg/dL — ABNORMAL HIGH (ref 0.44–1.00)
GFR calc Af Amer: 17 mL/min — ABNORMAL LOW (ref >60)
GFR calc non Af Amer: 15 mL/min — ABNORMAL LOW (ref >60)
Glucose, Bld: 94 mg/dL (ref 70–99)
POTASSIUM: 3.5 mmol/L (ref 3.5–5.1)
Sodium: 140 mmol/L (ref 135–145)

## 2018-06-23 LAB — CBC WITH DIFFERENTIAL/PLATELET
ABS IMMATURE GRANULOCYTES: 0.01 10*3/uL (ref 0.00–0.07)
Basophils Absolute: 0 10*3/uL (ref 0.0–0.1)
Basophils Relative: 0 %
Eosinophils Absolute: 0.1 10*3/uL (ref 0.0–0.5)
Eosinophils Relative: 2 %
HCT: 39.3 % (ref 36.0–46.0)
Hemoglobin: 11.9 g/dL — ABNORMAL LOW (ref 12.0–15.0)
Immature Granulocytes: 0 %
LYMPHS PCT: 41 %
Lymphs Abs: 2.5 10*3/uL (ref 0.7–4.0)
MCH: 23.4 pg — AB (ref 26.0–34.0)
MCHC: 30.3 g/dL (ref 30.0–36.0)
MCV: 77.2 fL — ABNORMAL LOW (ref 80.0–100.0)
Monocytes Absolute: 0.3 10*3/uL (ref 0.1–1.0)
Monocytes Relative: 5 %
Neutro Abs: 3.2 10*3/uL (ref 1.7–7.7)
Neutrophils Relative %: 52 %
Platelets: 293 10*3/uL (ref 150–400)
RBC: 5.09 MIL/uL (ref 3.87–5.11)
RDW: 17.2 % — ABNORMAL HIGH (ref 11.5–15.5)
WBC: 6.1 10*3/uL (ref 4.0–10.5)
nRBC: 0 % (ref 0.0–0.2)

## 2018-06-23 MED ORDER — AMOXICILLIN 500 MG PO CAPS: 500.0000 mg | ORAL_CAPSULE | Freq: Three times a day (TID) | ORAL | 0 refills | Status: DC

## 2018-06-23 MED ORDER — TRAMADOL HCL 50 MG PO TABS: 50.0000 mg | ORAL_TABLET | Freq: Four times a day (QID) | ORAL | 0 refills | Status: DC | PRN

## 2018-06-23 MED ORDER — METOPROLOL SUCCINATE ER 100 MG PO TB24: 100.0000 mg | ORAL_TABLET | Freq: Every day | ORAL | 1 refills | Status: DC

## 2018-06-23 MED ORDER — HYDROCHLOROTHIAZIDE 25 MG PO TABS: 25.0000 mg | ORAL_TABLET | Freq: Every day | ORAL | 1 refills | Status: DC

## 2018-06-23 MED ORDER — HYDROCHLOROTHIAZIDE 25 MG PO TABS
25.0000 mg | ORAL_TABLET | Freq: Every day | ORAL | Status: DC
  Administered 2018-06-23: 25 mg via ORAL
  Filled 2018-06-23: qty 1

## 2018-06-23 MED ORDER — ACETAMINOPHEN 500 MG PO TABS
1000.0000 mg | ORAL_TABLET | Freq: Once | ORAL | Status: AC
  Administered 2018-06-23: 1000 mg via ORAL
  Filled 2018-06-23: qty 2

## 2018-06-23 MED ORDER — METOPROLOL SUCCINATE ER 25 MG PO TB24
100.0000 mg | ORAL_TABLET | Freq: Every day | ORAL | Status: DC
  Administered 2018-06-23: 100 mg via ORAL
  Filled 2018-06-23: qty 4

## 2018-06-23 MED ORDER — AMOXICILLIN 250 MG PO CAPS
500.0000 mg | ORAL_CAPSULE | Freq: Once | ORAL | Status: AC
  Administered 2018-06-23: 500 mg via ORAL
  Filled 2018-06-23: qty 2

## 2018-06-23 NOTE — Discharge Instructions (Addendum)
Your blood pressure was extremely elevated.  You were treated in the emergency department with your regular medication with improvement in your blood pressure.  Your dental pain seems to be related to cavities.  Please use Amoxil 3 times daily.  Please see a dentist as soon as possible.  Use Tylenol every 4 hours for mild pain, may use Ultram for more severe pain.  Please do not drive, operate machinery, drink alcohol, or participate in activities requiring concentration when taking this medication.  Please have your blood pressure rechecked soon.  Your examination also revealed increase in skipped heart beats called PVCs.  It is important that you have your doctor recheck this problem soon.  Your kidney function is going up, and this needs to be checked as well.

## 2018-06-23 NOTE — ED Provider Notes (Signed)
[facility] Provider Note   CSN: [id number] Arrival date & time: 06/23/18  0908    History   Chief Complaint Chief Complaint  Patient presents with  . Dental Pain    HPI Margaret Aguilar is a 58 y.o. female.     The history is provided by the patient.  Dental Pain  Location:  Upper Quality:  Aching Severity:  Moderate Onset quality:  Gradual Duration:  3 days Timing:  Intermittent Progression:  Worsening Chronicity:  New Context: dental caries   Relieved by:  Nothing Worsened by:  Hot food/drink and cold food/drink Ineffective treatments:  Acetaminophen Associated symptoms: facial pain and gum swelling   Associated symptoms: no difficulty swallowing, no fever, no neck pain and no trismus   Risk factors: diabetes and lack of dental care     Past Medical History:  Diagnosis Date  . Diabetes mellitus without complication ([place])    x 5 yrs  . Hypertension   . Kidney disease    Stage 4  . 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 CKD (chronic kidney disease) stage 4, GFR 15-29 ml/min (HCC) 01/09/2017  . Spondylosis of lumbosacral region 10/09/2016  . Type 2 diabetes mellitus, without long-term current use of insulin ([place]) 09/18/2016  . IDA (iron deficiency anemia) 12/14/2013  . Guaiac + stool 12/14/2013  . Hypertension 09/28/2013  . Diabetes ([place]) 09/28/2013  . Renal insufficiency 09/28/2013  . Morbid obesity ([place]) 09/28/2013  . Obstructive sleep apnea on CPAP 06/05/2011  . Hyperlipidemia 06/05/2011  . Gastroesophageal reflux disease 06/05/2011  . Arthritis 06/05/2011    Past Surgical History:  Procedure Laterality Date  . CESAREAN SECTION    . COLONOSCOPY N/A 01/12/2014   Procedure: COLONOSCOPY;  Surgeon: Rogene Houston, MD;  Location[facility];  Service: Endoscopy;  Laterality: N/A;  225  . TUBAL LIGATION       OB History   No obstetric  history on file.      Home Medications    Prior to Admission medications   Medication Sig Start Date End Date Taking? Authorizing Provider  albuterol (PROVENTIL HFA;VENTOLIN HFA) 108 (90 Base) MCG/ACT inhaler Inhale 2 puffs into the lungs every 6 (six) hours as needed for wheezing or shortness of breath. 01/12/18   Fulp, Cammie, MD  amLODipine-benazepril (LOTREL) 10-20 MG capsule Take 1 capsule by mouth daily. 01/12/18   Fulp, Cammie, MD  aspirin EC 81 MG tablet Take 1 tablet by mouth daily.    [provider]  atorvastatin (LIPITOR) 20 MG tablet Take 1 tablet by mouth daily.    [provider]  Blood Glucose Monitoring Suppl (TRUE METRIX AIR GLUCOSE METER) w/Device KIT 1 Device by Does not apply route 2 (two) times daily at 8 am and 10 pm. 01/12/18   Fulp, Cammie, MD  cholecalciferol (VITAMIN D) 1000 UNITS tablet Take 1,000 Units by mouth daily.    [provider]  glimepiride (AMARYL) 1 MG tablet Take 1 mg by mouth daily before breakfast.    [provider]  glucose blood (TRUE METRIX BLOOD GLUCOSE TEST) test strip Use as instructed to check blood sugars twice per day 01/12/18   Fulp, Cammie, MD  hydrochlorothiazide (HYDRODIURIL) 25 MG tablet Take 1 tablet (25 mg total) by mouth daily. 01/12/18   Fulp, Cammie, MD  methocarbamol (ROBAXIN) 500 MG tablet Take 1 tablet (500 mg total)  by mouth every 8 (eight) hours as needed for muscle spasms. 06/11/16   Patrecia Pour, MD  metoprolol succinate (TOPROL-XL) 100 MG 24 hr tablet Take 1 tablet (100 mg total) by mouth daily. Take with or immediately following a meal. 01/12/18   Fulp, Cammie, MD  TRUEPLUS LANCETS 28G MISC Use to check blood sugar twice per day 01/12/18   Antony Blackbird, MD    Family History Family History  Problem Relation Age of Onset  . Diabetes Mother   . Hyperlipidemia Mother   . Hyperlipidemia Father   . Hypertension Father   . Stroke Brother   . Diabetes Brother     Social History Social  History   Tobacco Use  . Smoking status: Never Smoker  . Smokeless tobacco: Never Used  Substance Use Topics  . Alcohol use: No  . Drug use: No     Allergies   Nutritional supplements and Latex   Review of Systems Review of Systems  Constitutional: Negative for activity change and fever.       All ROS Neg except as noted in HPI  HENT: Positive for dental problem. Negative for nosebleeds.   Eyes: Negative for photophobia and discharge.  Respiratory: Negative for cough, shortness of breath and wheezing.   Cardiovascular: Negative for chest pain and palpitations.  Gastrointestinal: Negative for abdominal pain and blood in stool.  Genitourinary: Negative for dysuria, frequency and hematuria.  Musculoskeletal: Negative for arthralgias, back pain and neck pain.  Skin: Negative.   Neurological: Negative for dizziness, seizures and speech difficulty.  Psychiatric/Behavioral: Negative for confusion and hallucinations.     Physical Exam Updated Vital Signs BP (!) 172/109 (BP Location: Left Arm)   Pulse 62   Temp 97.9 F (36.6 C) (Oral)   Resp 17   Ht _0  (1.575 m)   Wt 111.6 kg   SpO2 97%   BMI 44.99 kg/m   Physical Exam Vitals signs and nursing note reviewed.  Constitutional:      Appearance: She is well-developed. She is not toxic-appearing.  HENT:     Head: Normocephalic.     Jaw: Tenderness present. No trismus.      Right Ear: Tympanic membrane and external ear normal.     Left Ear: Tympanic membrane and external ear normal.     Mouth/Throat:     Dentition: Abnormal dentition. Has dentures. Dental tenderness present.   Eyes:     General: Lids are normal.     Pupils: Pupils are equal, round, and reactive to light.  Neck:     Musculoskeletal: Normal range of motion and neck supple.     Vascular: No carotid bruit.  Cardiovascular:     Pulses: Normal pulses.     Heart sounds: Normal heart sounds.     Comments: Trigeminal pattern Pulmonary:     Effort: No  respiratory distress.     Breath sounds: Normal breath sounds.  Abdominal:     General: Bowel sounds are normal.     Palpations: Abdomen is soft.     Tenderness: There is no abdominal tenderness. There is no guarding.  Musculoskeletal: Normal range of motion.  Lymphadenopathy:     Head:     Right side of head: No submandibular adenopathy.     Left side of head: No submandibular adenopathy.     Cervical: No cervical adenopathy.  Skin:    General: Skin is warm and dry.  Neurological:     Mental Status: She is alert and  oriented to person, place, and time.     Cranial Nerves: No cranial nerve deficit.     Sensory: No sensory deficit.  Psychiatric:        Speech: Speech normal.      ED Treatments / Results  Labs (all labs ordered are listed, but only abnormal results are displayed) Labs Reviewed - No data to display  EKG None  Radiology No results found.  Procedures Procedures (including critical care time)  Medications Ordered in ED Medications - No data to display   Initial Impression / Assessment and Plan / ED Course  I have reviewed the triage vital signs and the nursing notes.  Pertinent labs & imaging results that were available during my care of the patient were reviewed by me and considered in my medical decision making (see chart for details).          Final Clinical Impressions(s) / ED Diagnoses MDM  Blood pressure was elevated at 172/109 and 178/100.  The patient was treated in the emergency department with hydrochlorothiazide and metoprolol.  Pulse oximetry is 95 to 98% on room air.  Within normal limits by my interpretation.  Patient noted to have dental cavity without abscess.  Patient treated with Amoxil.  During the course of the work-up the patient was noted to be in a abnormal cardiac rhythm with trigeminy and quadrigeminy.  Electrocardiogram was negative for acute event.  Review of the patient's labs showed the kidney function continuing to  elevate.  I have made the patient aware of the elevation in blood pressure, and the elevation in renal function, and the changes in her cardiac rhythm.  I have made her aware of the danger of not seeing her primary doctor and/or specialist concerning these situations.  The patient promises me that she is going to go have them recheck.  Recheck of blood pressure shows a blood pressure improving at 158/87 and 168/93.  Patient will be discharged home with very strong and strict instructions to see the primary specialist as soon as possible.  The patient is to return to the emergency department if any changes in her condition, problems, or concerns.   Final diagnoses:  Dental caries  Pain, dental  PVC's (premature ventricular contractions)  Essential hypertension    ED Discharge Orders         Ordered    amoxicillin (AMOXIL) 500 MG capsule  3 times daily     06/23/18 1351    hydrochlorothiazide (HYDRODIURIL) 25 MG tablet  Daily     06/23/18 1351    metoprolol succinate (TOPROL-XL) 100 MG 24 hr tablet  Daily     06/23/18 1351    traMADol (ULTRAM) 50 MG tablet  Every 6 hours PRN     06/23/18 1351           Lily Kocher, PA-C 06/23/18 1726    Virgel Manifold, MD 06/25/18 [phone]

## 2018-06-23 NOTE — ED Triage Notes (Signed)
Pt c/o dental pain to left upper jaw tooth x 3 days, pt reports "I have a hole in my tooth", denies seeing dentist

## 2018-06-23 NOTE — ED Notes (Signed)
Phlebotomy at bedside.

## 2018-06-25 ENCOUNTER — Telehealth: Payer: Self-pay | Admitting: Cardiology

## 2018-06-25 NOTE — Telephone Encounter (Signed)
Pt came in and wanted to know if the w-2s provided were good would like a follow up

## 2018-07-30 MED FILL — AMLODIPINE-BENAZEPRIL 10-20: 10-20 | 30 days supply | Qty: 30 | Fill #1

## 2018-07-30 MED FILL — METOPROLOL SUCCINATE ER 100: 100 | 30 days supply | Qty: 30 | Fill #1

## 2018-07-30 MED FILL — HYDROCHLOROTHIAZIDE 25 MG T: 25 | 30 days supply | Qty: 30 | Fill #1

## 2018-08-08 ENCOUNTER — Emergency Department (HOSPITAL_COMMUNITY): Payer: Medicaid Other

## 2018-08-08 ENCOUNTER — Inpatient Hospital Stay (HOSPITAL_COMMUNITY)
Admission: EM | Admit: 2018-08-08 | Discharge: 2018-08-11 | DRG: 683 | Disposition: A | Discharge status: Home Health | Payer: Medicaid Other | Attending: Family Medicine | Admitting: Family Medicine

## 2018-08-08 ENCOUNTER — Other Ambulatory Visit: Payer: Self-pay

## 2018-08-08 ENCOUNTER — Encounter (HOSPITAL_COMMUNITY): Payer: Self-pay | Admitting: Emergency Medicine

## 2018-08-08 DIAGNOSIS — Z8249 Family history of ischemic heart disease and other diseases of the circulatory system: Secondary | ICD-10-CM | POA: Diagnosis not present

## 2018-08-08 DIAGNOSIS — E1122 Type 2 diabetes mellitus with diabetic chronic kidney disease: Secondary | ICD-10-CM | POA: Diagnosis not present

## 2018-08-08 DIAGNOSIS — N179 Acute kidney failure, unspecified: Secondary | ICD-10-CM | POA: Diagnosis not present

## 2018-08-08 DIAGNOSIS — E876 Hypokalemia: Secondary | ICD-10-CM | POA: Diagnosis present

## 2018-08-08 DIAGNOSIS — M109 Gout, unspecified: Secondary | ICD-10-CM | POA: Diagnosis present

## 2018-08-08 DIAGNOSIS — E860 Dehydration: Secondary | ICD-10-CM | POA: Diagnosis not present

## 2018-08-08 DIAGNOSIS — Z91040 Latex allergy status: Secondary | ICD-10-CM

## 2018-08-08 DIAGNOSIS — Z833 Family history of diabetes mellitus: Secondary | ICD-10-CM

## 2018-08-08 DIAGNOSIS — Z6841 Body Mass Index (BMI) 40.0 and over, adult: Secondary | ICD-10-CM | POA: Diagnosis not present

## 2018-08-08 DIAGNOSIS — E785 Hyperlipidemia, unspecified: Secondary | ICD-10-CM | POA: Diagnosis present

## 2018-08-08 DIAGNOSIS — K219 Gastro-esophageal reflux disease without esophagitis: Secondary | ICD-10-CM | POA: Diagnosis present

## 2018-08-08 DIAGNOSIS — N184 Chronic kidney disease, stage 4 (severe): Secondary | ICD-10-CM | POA: Diagnosis present

## 2018-08-08 DIAGNOSIS — N189 Chronic kidney disease, unspecified: Secondary | ICD-10-CM | POA: Diagnosis present

## 2018-08-08 DIAGNOSIS — I10 Essential (primary) hypertension: Secondary | ICD-10-CM | POA: Diagnosis present

## 2018-08-08 DIAGNOSIS — Z823 Family history of stroke: Secondary | ICD-10-CM | POA: Diagnosis not present

## 2018-08-08 DIAGNOSIS — N170 Acute kidney failure with tubular necrosis: Secondary | ICD-10-CM | POA: Diagnosis present

## 2018-08-08 DIAGNOSIS — M7732 Calcaneal spur, left foot: Secondary | ICD-10-CM | POA: Diagnosis not present

## 2018-08-08 DIAGNOSIS — I129 Hypertensive chronic kidney disease with stage 1 through stage 4 chronic kidney disease, or unspecified chronic kidney disease: Secondary | ICD-10-CM | POA: Diagnosis present

## 2018-08-08 DIAGNOSIS — I959 Hypotension, unspecified: Secondary | ICD-10-CM | POA: Diagnosis not present

## 2018-08-08 DIAGNOSIS — Z8349 Family history of other endocrine, nutritional and metabolic diseases: Secondary | ICD-10-CM | POA: Diagnosis not present

## 2018-08-08 DIAGNOSIS — R0602 Shortness of breath: Secondary | ICD-10-CM | POA: Diagnosis not present

## 2018-08-08 DIAGNOSIS — D631 Anemia in chronic kidney disease: Secondary | ICD-10-CM | POA: Diagnosis present

## 2018-08-08 DIAGNOSIS — M064 Inflammatory polyarthropathy: Secondary | ICD-10-CM | POA: Diagnosis present

## 2018-08-08 DIAGNOSIS — M1990 Unspecified osteoarthritis, unspecified site: Secondary | ICD-10-CM

## 2018-08-08 DIAGNOSIS — Z91018 Allergy to other foods: Secondary | ICD-10-CM

## 2018-08-08 DIAGNOSIS — E119 Type 2 diabetes mellitus without complications: Secondary | ICD-10-CM

## 2018-08-08 DIAGNOSIS — G4733 Obstructive sleep apnea (adult) (pediatric): Secondary | ICD-10-CM | POA: Diagnosis present

## 2018-08-08 DIAGNOSIS — R52 Pain, unspecified: Secondary | ICD-10-CM | POA: Diagnosis not present

## 2018-08-08 HISTORY — DX: Herpesviral infection, unspecified: B00.9

## 2018-08-08 HISTORY — DX: Unspecified osteoarthritis, unspecified site: M19.90

## 2018-08-08 LAB — BASIC METABOLIC PANEL
Anion gap: 14 (ref 5–15)
BUN: 97 mg/dL — ABNORMAL HIGH (ref 6–20)
CO2: 22 mmol/L (ref 22–32)
Calcium: 9 mg/dL (ref 8.9–10.3)
Chloride: 100 mmol/L (ref 98–111)
Creatinine, Ser: 5.31 mg/dL — ABNORMAL HIGH (ref 0.44–1.00)
GFR calc Af Amer: 10 mL/min — ABNORMAL LOW (ref >60)
GFR calc non Af Amer: 8 mL/min — ABNORMAL LOW (ref >60)
Glucose, Bld: 118 mg/dL — ABNORMAL HIGH (ref 70–99)
Potassium: 3.3 mmol/L — ABNORMAL LOW (ref 3.5–5.1)
Sodium: 136 mmol/L (ref 135–145)

## 2018-08-08 LAB — CBC WITH DIFFERENTIAL/PLATELET
Abs Immature Granulocytes: 0.04 10*3/uL (ref 0.00–0.07)
Basophils Absolute: 0 10*3/uL (ref 0.0–0.1)
Basophils Relative: 0 %
Eosinophils Absolute: 0.1 10*3/uL (ref 0.0–0.5)
Eosinophils Relative: 0 %
HCT: 37.5 % (ref 36.0–46.0)
Hemoglobin: 11.5 g/dL — ABNORMAL LOW (ref 12.0–15.0)
Immature Granulocytes: 0 %
Lymphocytes Relative: 22 %
Lymphs Abs: 2.6 10*3/uL (ref 0.7–4.0)
MCH: 23.7 pg — ABNORMAL LOW (ref 26.0–34.0)
MCHC: 30.7 g/dL (ref 30.0–36.0)
MCV: 77.2 fL — ABNORMAL LOW (ref 80.0–100.0)
Monocytes Absolute: 0.6 10*3/uL (ref 0.1–1.0)
Monocytes Relative: 5 %
Neutro Abs: 8.5 10*3/uL — ABNORMAL HIGH (ref 1.7–7.7)
Neutrophils Relative %: 73 %
Platelets: 449 10*3/uL — ABNORMAL HIGH (ref 150–400)
RBC: 4.86 MIL/uL (ref 3.87–5.11)
RDW: 15.9 % — ABNORMAL HIGH (ref 11.5–15.5)
WBC: 11.8 10*3/uL — ABNORMAL HIGH (ref 4.0–10.5)
nRBC: 0 % (ref 0.0–0.2)

## 2018-08-08 LAB — SODIUM, URINE, RANDOM: Sodium, Ur: 71 mmol/L

## 2018-08-08 LAB — URIC ACID: Uric Acid, Serum: 17.7 mg/dL — ABNORMAL HIGH (ref 2.5–7.1)

## 2018-08-08 LAB — URINALYSIS, COMPLETE (UACMP) WITH MICROSCOPIC
Bilirubin Urine: NEGATIVE
Glucose, UA: NEGATIVE mg/dL
Ketones, ur: NEGATIVE mg/dL
Leukocytes,Ua: NEGATIVE
Nitrite: NEGATIVE
Protein, ur: NEGATIVE mg/dL
Specific Gravity, Urine: 1.008 (ref 1.005–1.030)
pH: 5 (ref 5.0–8.0)

## 2018-08-08 LAB — GLUCOSE, CAPILLARY
Glucose-Capillary: 131 mg/dL — ABNORMAL HIGH (ref 70–99)
Glucose-Capillary: 118 mg/dL — ABNORMAL HIGH (ref 70–99)

## 2018-08-08 LAB — C-REACTIVE PROTEIN: CRP: 12.7 mg/dL — ABNORMAL HIGH (ref <1.0)

## 2018-08-08 LAB — SEDIMENTATION RATE: Sed Rate: 103 mm/hr — ABNORMAL HIGH (ref 0–22)

## 2018-08-08 LAB — MAGNESIUM: Magnesium: 2.8 mg/dL — ABNORMAL HIGH (ref 1.7–2.4)

## 2018-08-08 LAB — PHOSPHORUS: Phosphorus: 4.8 mg/dL — ABNORMAL HIGH (ref 2.5–4.6)

## 2018-08-08 LAB — CREATININE, URINE, RANDOM: Creatinine, Urine: 53.06 mg/dL

## 2018-08-08 LAB — PROTEIN, URINE, RANDOM: Total Protein, Urine: 35 mg/dL

## 2018-08-08 MED ORDER — INSULIN ASPART 100 UNIT/ML ~~LOC~~ SOLN: 0.0000 [IU] | Freq: Every day | SUBCUTANEOUS | Status: DC

## 2018-08-08 MED ORDER — HEPARIN SODIUM (PORCINE) 5000 UNIT/ML IJ SOLN
5000.0000 [IU] | Freq: Three times a day (TID) | INTRAMUSCULAR | Status: DC
  Administered 2018-08-08 – 2018-08-10 (×7): 5000 [IU] via SUBCUTANEOUS
  Filled 2018-08-08 (×8): qty 1

## 2018-08-08 MED ORDER — HYDROCODONE-ACETAMINOPHEN 5-325 MG PO TABS
1.0000 | ORAL_TABLET | Freq: Once | ORAL | Status: AC
  Administered 2018-08-08: 1 via ORAL
  Filled 2018-08-08: qty 1

## 2018-08-08 MED ORDER — SODIUM CHLORIDE 0.9 % IV BOLUS
1000.0000 mL | Freq: Once | INTRAVENOUS | Status: AC
  Administered 2018-08-08: 1000 mL via INTRAVENOUS

## 2018-08-08 MED ORDER — INSULIN ASPART 100 UNIT/ML ~~LOC~~ SOLN
0.0000 [IU] | Freq: Three times a day (TID) | SUBCUTANEOUS | Status: DC
  Administered 2018-08-08 – 2018-08-09 (×2): 1 [IU] via SUBCUTANEOUS
  Administered 2018-08-10 – 2018-08-11 (×3): 2 [IU] via SUBCUTANEOUS

## 2018-08-08 MED ORDER — POTASSIUM CHLORIDE CRYS ER 20 MEQ PO TBCR
20.0000 meq | EXTENDED_RELEASE_TABLET | Freq: Once | ORAL | Status: AC
  Administered 2018-08-08: 20 meq via ORAL
  Filled 2018-08-08: qty 1

## 2018-08-08 MED ORDER — PREDNISONE 20 MG PO TABS
40.0000 mg | ORAL_TABLET | Freq: Every day | ORAL | Status: DC
  Administered 2018-08-09 – 2018-08-11 (×3): 40 mg via ORAL
  Filled 2018-08-08 (×3): qty 2

## 2018-08-08 MED ORDER — COLCHICINE 0.6 MG PO TABS
0.6000 mg | ORAL_TABLET | Freq: Every day | ORAL | Status: DC
  Administered 2018-08-08: 0.6 mg via ORAL
  Filled 2018-08-08: qty 1

## 2018-08-08 MED ORDER — SODIUM CHLORIDE 0.9 % IV SOLN
INTRAVENOUS | Status: DC
  Administered 2018-08-08 – 2018-08-09 (×2): via INTRAVENOUS

## 2018-08-08 MED ORDER — IPRATROPIUM-ALBUTEROL 0.5-2.5 (3) MG/3ML IN SOLN: 3.0000 mL | Freq: Four times a day (QID) | RESPIRATORY_TRACT | Status: DC | PRN

## 2018-08-08 MED ORDER — OXYCODONE-ACETAMINOPHEN 5-325 MG PO TABS
1.0000 | ORAL_TABLET | Freq: Four times a day (QID) | ORAL | Status: DC | PRN
  Administered 2018-08-08 – 2018-08-09 (×3): 1 via ORAL
  Filled 2018-08-08 (×3): qty 1

## 2018-08-08 NOTE — Plan of Care (Signed)
Patient complain of moderate to severe pain in left ankle. IV fluids infusing. Patient oriented to room/unit.

## 2018-08-08 NOTE — ED Triage Notes (Signed)
Pt from home c/o bilateral ankle pain and swelling x 5 days. Pt states that it feels like a gout flare up and that she ate something 5 days ago that causes her to swell. Pt not on any gout medication due to kidney issues. Pt also reports difficulty in ambulation due to pain.

## 2018-08-08 NOTE — ED Provider Notes (Signed)
[facility] EMERGENCY DEPARTMENT Provider Note   CSN: [id number] Arrival date & time: 08/08/18  0511    History   Chief Complaint Chief Complaint  Patient presents with  . Gout    HPI Margaret Aguilar is a 58 y.o. female with a history of diet controlled diabetes, htn, stage IV kidney disease, GERD and history of gout (stating the last flair of gout occurred in her bilateral finger joints 3 months ago) presenting with pain and swelling in her bilateral ankles with mild pain in the right ankle, severe pain in the left ankle.  She states the pain actually started in her left groin 5 days ago, which is still somewhat sore (describes spasms with movement of her medial thigh) but severe ankle pain has preventing her ability to walk.  She also endorses pain in her buttocks secondary to sitting for the past 5 days.  She denies fevers or chills, no rash, redness at the sites of pain, and denies injury or falls. She has taken tylenol for pain without improvement.      The history is provided by the patient.    Past Medical History:  Diagnosis Date  . Diabetes mellitus without complication ([place])    x 5 yrs  . Hypertension   . Kidney disease    Stage 4  . 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 CKD (chronic kidney disease) stage 4, GFR 15-29 ml/min (HCC) 01/09/2017  . Spondylosis of lumbosacral region 10/09/2016  . Type 2 diabetes mellitus, without long-term current use of insulin ([place]) 09/18/2016  . IDA (iron deficiency anemia) 12/14/2013  . Guaiac + stool 12/14/2013  . Hypertension 09/28/2013  . Diabetes ([place]) 09/28/2013  . Renal insufficiency 09/28/2013  . Morbid obesity ([place]) 09/28/2013  . Obstructive sleep apnea on CPAP 06/05/2011  . Hyperlipidemia 06/05/2011  . Gastroesophageal reflux disease 06/05/2011  . Arthritis 06/05/2011    Past Surgical History:  Procedure Laterality Date  .  CESAREAN SECTION    . COLONOSCOPY N/A 01/12/2014   Procedure: COLONOSCOPY;  Surgeon: Rogene Houston, MD;  Location: [facility] ENDO SUITE;  Service: Endoscopy;  Laterality: N/A;  225  . TUBAL LIGATION       OB History   No obstetric history on file.      Home Medications    Prior to Admission medications   Medication Sig Start Date End Date Taking? Authorizing Provider  Blood Glucose Monitoring Suppl (TRUE METRIX AIR GLUCOSE METER) w/Device KIT 1 Device by Does not apply route 2 (two) times daily at 8 am and 10 pm. 01/12/18  Yes Fulp, Cammie, MD  glucose blood (TRUE METRIX BLOOD GLUCOSE TEST) test strip Use as instructed to check blood sugars twice per day 01/12/18  Yes Fulp, Cammie, MD  TRUEPLUS LANCETS 28G MISC Use to check blood sugar twice per day 01/12/18  Yes Fulp, Cammie, MD  acetaminophen (TYLENOL) 500 MG tablet Take 1,000-2,000 mg by mouth every 6 (six) hours as needed.    [provider]  albuterol (PROVENTIL HFA;VENTOLIN HFA) 108 (90 Base) MCG/ACT inhaler Inhale 2 puffs into the lungs every 6 (six) hours as needed for wheezing or shortness of breath. 01/12/18   Fulp, Cammie, MD  amLODipine-benazepril (LOTREL) 10-20 MG capsule Take 1 capsule by mouth daily. 01/12/18   Fulp, Cammie, MD  hydrochlorothiazide (HYDRODIURIL) 25 MG tablet Take 1 tablet (25 mg total) by mouth daily. 06/23/18  Lily Kocher, PA-C  metoprolol succinate (TOPROL-XL) 100 MG 24 hr tablet Take 1 tablet (100 mg total) by mouth daily. Take with or immediately following a meal. 06/23/18   Lily Kocher, PA-C  traMADol (ULTRAM) 50 MG tablet Take 1 tablet (50 mg total) by mouth every 6 (six) hours as needed. 06/23/18   Lily Kocher, PA-C    Family History Family History  Problem Relation Age of Onset  . Diabetes Mother   . Hyperlipidemia Mother   . Hyperlipidemia Father   . Hypertension Father   . Stroke Brother   . Diabetes Brother     Social History Social History   Tobacco Use  . Smoking status:  Never Smoker  . Smokeless tobacco: Never Used  Substance Use Topics  . Alcohol use: No  . Drug use: No     Allergies   Nutritional supplements and Latex   Review of Systems Review of Systems  Constitutional: Negative for chills and fever.  HENT: Negative.   Respiratory: Negative.   Cardiovascular: Negative.   Gastrointestinal: Negative.  Negative for abdominal pain, nausea and vomiting.  Genitourinary: Negative.  Negative for dysuria.  Musculoskeletal: Positive for arthralgias, gait problem and joint swelling.  Skin: Negative.   Neurological: Negative for headaches.  Hematological: Does not bruise/bleed easily.     Physical Exam Updated Vital Signs BP 98/76   Pulse (!) 54   Temp 97.7 F (36.5 C) (Oral)   Resp 18   Ht _0  (1.575 m)   Wt 112.9 kg   SpO2 100%   BMI 45.54 kg/m   Physical Exam Vitals signs and nursing note reviewed.  Constitutional:      Appearance: She is well-developed.  HENT:     Head: Normocephalic and atraumatic.     Mouth/Throat:     Mouth: Mucous membranes are moist.  Eyes:     Conjunctiva/sclera: Conjunctivae normal.  Neck:     Musculoskeletal: Normal range of motion.  Cardiovascular:     Rate and Rhythm: Normal rate and regular rhythm.     Pulses:          Dorsalis pedis pulses are 2+ on the right side and 2+ on the left side.     Heart sounds: Normal heart sounds.  Pulmonary:     Effort: Pulmonary effort is normal.     Breath sounds: Normal breath sounds. No wheezing.  Abdominal:     General: Bowel sounds are normal.     Palpations: Abdomen is soft.     Tenderness: There is no abdominal tenderness.  Musculoskeletal: Normal range of motion.        General: Tenderness present.     Left hip: She exhibits normal range of motion.     Comments: Mild bilateral ankle edema, no effusion.  No erythema or increased warmth.  TTP left>right.  ttp anterior left upper thigh. No edema, induration, no cords or edema.   Skin:    General: Skin  is warm and dry.     Capillary Refill: Capillary refill takes less than 2 seconds.     Comments: 2 sec cap refill in great toes.  No skin lesions, no sacral lesions/breakdown.  Neurological:     Mental Status: She is alert.      ED Treatments / Results  Labs (all labs ordered are listed, but only abnormal results are displayed) Labs Reviewed  CBC WITH DIFFERENTIAL/PLATELET - Abnormal; Notable for the following components:      Result Value  WBC 11.8 (*)    Hemoglobin 11.5 (*)    MCV 77.2 (*)    MCH 23.7 (*)    RDW 15.9 (*)    Platelets 449 (*)    Neutro Abs 8.5 (*)    All other components within normal limits  BASIC METABOLIC PANEL - Abnormal; Notable for the following components:   Potassium 3.3 (*)    Glucose, Bld 118 (*)    BUN 97 (*)    Creatinine, Ser 5.31 (*)    GFR calc non Af Amer 8 (*)    GFR calc Af Amer 10 (*)    All other components within normal limits  URIC ACID - Abnormal; Notable for the following components:   Uric Acid, Serum 17.7 (*)    All other components within normal limits  SEDIMENTATION RATE - Abnormal; Notable for the following components:   Sed Rate 103 (*)    All other components within normal limits  C-REACTIVE PROTEIN - Abnormal; Notable for the following components:   CRP 12.7 (*)    All other components within normal limits  RHEUMATOID FACTOR    EKG None  Radiology No results found.  Procedures Procedures (including critical care time)  Medications Ordered in ED Medications  HYDROcodone-acetaminophen (NORCO/VICODIN) 5-325 MG per tablet 1 tablet (1 tablet Oral Given 08/08/18 1057)  sodium chloride 0.9 % bolus 1,000 mL (1,000 mLs Intravenous New Bag/Given 08/08/18 1240)     Initial Impression / Assessment and Plan / ED Course  I have reviewed the triage vital signs and the nursing notes.  Pertinent labs & imaging results that were available during my care of the patient were reviewed by me and considered in my medical decision  making (see chart for details).        Labs reviewed and discussed. Pt endorses she has been eating/drinking less than normal this week, somewhat intentional as she states ambulating has been impossible, unable to get to the bathroom, but also states she has had a reduced appetite as well.  Pt with history of arthritis per past hx, when asked what kind, she was unsure, but rheumatoid "sounds familiar".  She reports has never been given medicines for RA.  Sed rate and crp elevated, RF pending.  Uric acid also very elevated as well.  Acute kidney injury. IV fluids started for rehydration.  Pt will need admission and is agreeable.   Final Clinical Impressions(s) / ED Diagnoses   Final diagnoses:  AKI (acute kidney injury) [facility])  Dehydration  Inflammatory arthritis    ED Discharge Orders    None       Landis Martins 08/08/18 1302    Noemi Chapel, MD 08/09/18 1119

## 2018-08-08 NOTE — H&P (Signed)
History and Physical  Margaret Aguilar URK:[id number] DOB: 17-Nov-1960 DOA: 08/08/2018  Referring physician: ER provider PCP: [name], MD  Outpatient Specialists:    Patient coming from: Home  Chief Complaint: Left ankle pain  HPI: Patient is a 58 year old female with past medical history significant for CKD stage IV, hypertension, gouty arthritis and diabetes mellitus.  Patient presents with a 5-day history of mainly left ankle pain.  No associated fever or chills.  No trauma to the legs.  No headache, no neck pain, no URI symptoms, no GI symptoms and no urinary symptoms.  On presentation to the hospital, ankle x-ray done was done was nonrevealing.  However, worsening renal function was noted.  BUN on presentation was 97 and serum creatinine was 5.31 (a month ago, BUN was 37 and serum creatinine was 3.28).  CRP is elevated at 12.7, sed rate is elevated at 103.  Poor p.o. intake is reported.  Patient be admitted for further assessment and management.  ED Course: Vitals on presentation revealed temperature of 97.7, blood pressure of 05/06/1957, heart rate of 64, respiratory rate of 16 and O2 sat of 100%.  Mildly elevated WBC was noted, elevated CRP and sed rate were also noted.  Uric acid level was elevated.  Worsening renal function was noted.  X-ray of the ankle done by the ER provider was nonrevealing.  Hospitalist team was asked to admit patient for further assessment and management.  Pertinent labs: BC reveals WBC of 11.8, hemoglobin of 11.5, hematocrit of 37.5, MCV of 77.2 platelet count of 149.  Chemistry reveals sodium of 136, potassium of 3.3, chloride 100, CO2 20, BUN of 97 and creatinine of 5.31.  Blood sugar was 118.  Sed rate of 103, CRP of 12.7 and uric acid of 9.5.  Imaging: independently reviewed.  Ankle x-ray was nonrevealing.  Review of Systems:  Negative for fever, visual changes, sore throat, rash, new muscle aches, chest pain, SOB, dysuria, bleeding, n/v/abdominal pain.  Past  Medical History:  Diagnosis Date  . Diabetes mellitus without complication ([place])    x 5 yrs  . Hypertension   . Kidney disease    Stage 4  . 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 CESAREAN SECTION    . COLONOSCOPY N/A 01/12/2014   Procedure: COLONOSCOPY;  Surgeon: Rogene Houston, MD;  Location: [facility] ENDO SU[facility];  Service: Endoscopy;  Laterality: N/A;  225  . TUBAL LIGATION       reports that she has never smoked. She has never used smokeless tobacco. She reports that she does not drink alcohol or use drugs.  Allergies  Allergen Reactions  . Nutritional Supplements     walnut  . Latex     Family History  Problem Relation Age of Onset  . Diabetes Mother   . Hyperlipidemia Mother   . Hyperlipidemia Father   . Hypertension Father   . Stroke Brother   . Diabetes Brother      Prior to Admission medications   Medication Sig Start Date End Date Taking? Authorizing Provider  acetaminophen (TYLENOL) 500 MG tablet Take 1,000-1,500 mg by mouth every 6 (six) hours as needed.    Yes [provider]  albuterol (PROVENTIL HFA;VENTOLIN HFA) 108 (90 Base) MCG/ACT inhaler Inhale 2 puffs into the lungs every 6 (six) hours as needed for wheezing or shortness of breath. 01/12/18  Yes Fulp, Cammie, MD  amLODipine-benazepril (LOTREL) 10-20  MG capsule Take 1 capsule by mouth daily. 01/12/18  Yes Fulp, Cammie, MD  Blood Glucose Monitoring Suppl (TRUE METRIX AIR GLUCOSE METER) w/Device KIT 1 Device by Does not apply route 2 (two) times daily at 8 am and 10 pm. 01/12/18  Yes Fulp, Cammie, MD  glucose blood (TRUE METRIX BLOOD GLUCOSE TEST) test strip Use as instructed to check blood sugars twice per day 01/12/18  Yes Fulp, Cammie, MD  hydrochlorothiazide (HYDRODIURIL) 25 MG tablet Take 1 tablet (25 mg total) by mouth daily. 06/23/18  Yes Lily Kocher, PA-C  metoprolol succinate (TOPROL-XL) 100 MG 24 hr tablet  Take 1 tablet (100 mg total) by mouth daily. Take with or immediately following a meal. 06/23/18  Yes Lily Kocher, PA-C  traMADol (ULTRAM) 50 MG tablet Take 1 tablet (50 mg total) by mouth every 6 (six) hours as needed. 06/23/18  Yes Lily Kocher, PA-C  TRUEPLUS LANCETS 28G MISC Use to check blood sugar twice per day 01/12/18  Yes Fulp, Cammie, MD    Physical Exam: Vitals:   08/08/18 1003 08/08/18 1234  BP: (!) 113/54 98/76  Pulse: (!) 54 (!) 54  Resp: 16 18  Temp: 97.7 F (36.5 C)   TempSrc: Oral   SpO2: 99% 100%  Weight: 112.9 kg   Height: '5\' 2"'  (1.575 m)     Constitutional:  . Appears calm and comfortable.  Patient is morbidly obese. Eyes:  [name] Mild pallor. No jaundice.  ENMT:  . external ears, nose appear normal Neck:  . Neck is supple. No JVD Respiratory:  . CTA bilaterally, no w/r/r.  . Respiratory effort normal. No retractions or accessory muscle use Cardiovascular:  . S1S2 -Left ankle is mildly ward to touch  Abdomen:  . Abdomen is obese, soft and non tender. Organs are difficult to assess. Neurologic:  . Awake and alert. . Moves all limbs.  Wt Readings from Last 3 Encounters:  08/08/18 112.9 kg  06/23/18 111.6 kg  01/12/18 122 kg    I have personally reviewed following labs and imaging studies  Labs on Admission:  CBC: Recent Labs  Lab 08/08/18 1102  WBC 11.8*  NEUTROABS 8.5*  HGB 11.5*  HCT 37.5  MCV 77.2*  PLT 696*   Basic Metabolic Panel: Recent Labs  Lab 08/08/18 1102  NA 136  K 3.3*  CL 100  CO2 22  GLUCOSE 118*  BUN 97*  CREATININE 5.31*  CALCIUM 9.0   Liver Function Tests: No results for input(s): AST, ALT, ALKPHOS, BILITOT, PROT, ALBUMIN in the last 168 hours. No results for input(s): LIPASE, AMYLASE in the last 168 hours. No results for input(s): AMMONIA in the last 168 hours. Coagulation Profile: No results for input(s): INR, PROTIME in the last 168 hours. Cardiac Enzymes: No results for input(s): CKTOTAL, CKMB,  CKMBINDEX, TROPONINI in the last 168 hours. BNP (last 3 results) No results for input(s): PROBNP in the last 8760 hours. HbA1C: No results for input(s): HGBA1C in the last 72 hours. CBG: No results for input(s): GLUCAP in the last 168 hours. Lipid Profile: No results for input(s): CHOL, HDL, LDLCALC, TRIG, CHOLHDL, LDLDIRECT in the last 72 hours. Thyroid Function Tests: No results for input(s): TSH, T4TOTAL, FREET4, T3FREE, THYROIDAB in the last 72 hours. Anemia Panel: No results for input(s): VITAMINB12, FOLATE, FERRITIN, TIBC, IRON, RETICCTPCT in the last 72 hours. Urine analysis: No results found for: COLORURINE, APPEARANCEUR, LABSPEC, PHURINE, GLUCOSEU, HGBUR, BILIRUBINUR, KETONESUR, PROTEINUR, UROBILINOGEN, NITRITE, LEUKOCYTESUR Sepsis Labs: '@LABRCNTIP' (procalcitonin:4,lacticidven:4) )No results found for this  or any previous visit (from the past 240 hour(s)).    Radiological Exams on Admission: Dg Ankle Complete Left  Result Date: 08/08/2018 CLINICAL DATA:  Left ankle pain for 5 days. No known injury. History of diabetes and chronic kidney disease. EXAM: LEFT ANKLE COMPLETE - 3+ VIEW COMPARISON:  Foot radiographs 07/04/2016. Ankle radiographs 12/29/2013. FINDINGS: The mineralization and alignment are normal. There is no evidence of acute fracture or dislocation. The joint spaces are preserved. Mild diffuse soft tissue swelling without evidence of foreign body or soft tissue emphysema. There are stable small calcaneal spurs. IMPRESSION: No acute osseous findings.  Mild diffuse soft tissue swelling. Electronically Signed   By: Richardean Sale M.D.   On: 08/08/2018 12:54    Active Problems:   * No active hospital problems. *   Assessment/Plan Left ankle pain, possible flare of gouty arthritis: Start colchicine and prednisone. Follow inflammatory markers. Follow resolution of symptoms. Further management depend on hospital course.  Acute kidney injury on CKD 4: AKI could be  prerenal versus ATN. Patient denies use of NSAIDs. Patient denies use of any nephrotoxic medication. Hydrate patient Avoid hypotension Check urinalysis, urine sodium, urine protein, urine creatinine, renal ultrasound, ANA, C3 and C4. Monitor renal function and electrolytes Further management depend on above. CKD is likely secondary to diabetes mellitus and hypertension.  Hypertension: Patient's blood pressure is currently running low normal. Hold any antihypertensives for now. Continue to monitor.  Diabetes mellitus: Continue to optimize.  Morbid obesity: Diet and exercise.  DVT prophylaxis: Subcu to heparin Code Status: Full code Family Communication:  Disposition Plan: Home eventually Consults called: Consult nephrology Admission status: Inpatient  Time spent: 65 minutes  Dana Allan, MD  [facility] Pager #: [phone] 7PM-7AM contact night coverage as above  08/08/2018, 1:06 PM

## 2018-08-09 ENCOUNTER — Inpatient Hospital Stay (HOSPITAL_COMMUNITY): Payer: Medicaid Other

## 2018-08-09 DIAGNOSIS — M109 Gout, unspecified: Secondary | ICD-10-CM

## 2018-08-09 LAB — CBC
HCT: 33.5 % — ABNORMAL LOW (ref 36.0–46.0)
Hemoglobin: 10.2 g/dL — ABNORMAL LOW (ref 12.0–15.0)
MCH: 23.3 pg — ABNORMAL LOW (ref 26.0–34.0)
MCHC: 30.4 g/dL (ref 30.0–36.0)
MCV: 76.7 fL — ABNORMAL LOW (ref 80.0–100.0)
Platelets: 440 10*3/uL — ABNORMAL HIGH (ref 150–400)
RBC: 4.37 MIL/uL (ref 3.87–5.11)
RDW: 16.2 % — ABNORMAL HIGH (ref 11.5–15.5)
WBC: 8.9 10*3/uL (ref 4.0–10.5)
nRBC: 0 % (ref 0.0–0.2)

## 2018-08-09 LAB — BASIC METABOLIC PANEL
Anion gap: 10 (ref 5–15)
BUN: 90 mg/dL — ABNORMAL HIGH (ref 6–20)
CO2: 21 mmol/L — ABNORMAL LOW (ref 22–32)
Calcium: 8.6 mg/dL — ABNORMAL LOW (ref 8.9–10.3)
Chloride: 109 mmol/L (ref 98–111)
Creatinine, Ser: 4.61 mg/dL — ABNORMAL HIGH (ref 0.44–1.00)
GFR calc Af Amer: 12 mL/min — ABNORMAL LOW (ref >60)
GFR calc non Af Amer: 10 mL/min — ABNORMAL LOW (ref >60)
Glucose, Bld: 115 mg/dL — ABNORMAL HIGH (ref 70–99)
Potassium: 3.4 mmol/L — ABNORMAL LOW (ref 3.5–5.1)
Sodium: 140 mmol/L (ref 135–145)

## 2018-08-09 LAB — GLUCOSE, CAPILLARY
Glucose-Capillary: 97 mg/dL (ref 70–99)
Glucose-Capillary: 122 mg/dL — ABNORMAL HIGH (ref 70–99)
Glucose-Capillary: 143 mg/dL — ABNORMAL HIGH (ref 70–99)
Glucose-Capillary: 139 mg/dL — ABNORMAL HIGH (ref 70–99)

## 2018-08-09 LAB — C4 COMPLEMENT: Complement C4, Body Fluid: 40 mg/dL (ref 14–44)

## 2018-08-09 LAB — HIV ANTIBODY (ROUTINE TESTING W REFLEX): HIV Screen 4th Generation wRfx: NONREACTIVE

## 2018-08-09 LAB — RHEUMATOID FACTOR: Rheumatoid fact SerPl-aCnc: 18.8 IU/mL — ABNORMAL HIGH (ref 0.0–13.9)

## 2018-08-09 LAB — C3 COMPLEMENT: C3 Complement: 188 mg/dL — ABNORMAL HIGH (ref 82–167)

## 2018-08-09 MED ORDER — ACETAMINOPHEN 325 MG PO TABS: 650.0000 mg | ORAL_TABLET | Freq: Four times a day (QID) | ORAL | Status: DC | PRN

## 2018-08-09 MED ORDER — SODIUM CHLORIDE 0.45 % IV SOLN
INTRAVENOUS | Status: DC
  Administered 2018-08-09 – 2018-08-10 (×2): via INTRAVENOUS

## 2018-08-09 MED ORDER — ONDANSETRON HCL 4 MG/2ML IJ SOLN: 4.0000 mg | Freq: Four times a day (QID) | INTRAMUSCULAR | Status: DC | PRN

## 2018-08-09 NOTE — Progress Notes (Signed)
RN notified by central telemetry monitoring that patient had 8 beat run of Vtac. Patient asymptomatic. Dr. Cathlean Sauer notified.

## 2018-08-09 NOTE — Progress Notes (Addendum)
PROGRESS NOTE    Margaret Aguilar  MGQ:[id number] DOB: [birth year] DOA: 08/08/2018 PCP: [name], MD    Brief Narrative:  58 year old female who presented with left ankle pain.  She does have significant past medical history for chronic kidney disease stage IV, hypertension, gouty arthritis and diabetes mellitus.  She reported 5 days of left ankle pain, no trauma associated.  On her initial physical examination her temperature was 97.7, heart rate 64, respiratory 16, oxygen saturation 100%, blood pressure 113/54.  Her lungs were clear to auscultation bilaterally, heart S1-S2 present and rhythmic, abdomen soft nontender, positive left ankle pain.  Sodium 136, potassium 3.3, chloride 100, bicarb 22, glucose 118, BUN 97, creatinine 5.31, anion gap 14, uric acid 17.7, white cell count 11.8, hemoglobin 11.5, hematocrit 37.5, platelets 449, sed rate 103.  Urinalysis specific gravity 1.008, 6-10 white cells, 0-5 red cells, urinary sodium 71.  Left ankle x-ray with no acute osseous findings, mild diffuse soft tissue edema.  Patient was admitted to the hospital with acute gout arthritis at the left ankle complicated by acute kidney injury chronic kidney disease.  Assessment & Plan:   Principal Problem:   Acute on chronic renal failure (HCC) Active Problems:   Hypertension   Diabetes (HCC)   Morbid obesity (HCC)   CKD (chronic kidney disease) stage 4, GFR 15-29 ml/min (HCC)   Gout attack   1. Acute gout flare. Patient with persistent pain, bilateral ankles, more left than right. Will continue with prednisone 40 mg daily, hold on colchicine and NSAID's due to low GFR.   2. AKI on CKD stage 4 with hypokalemia. Renal function with serum cr down to 4,61 from 5,31, K at 3,4 and serum bicarbonate at 21. Suspected hyopovolemia related worsening renal function. Will add low rate hypotonic saline and follow on renal response. Base cr at 3,0 with calculated GFR of 19.   3. HTN. Patient at home on  metoprolol, amlodipine-benazepril and HCTZ . Blood pressure systolic 267 and HR down to 49 to 51. Continue to hold on cardiac agents for now.   4. T2DM. Continue glucose cover and monitoring with insulin sliding scale.   5. Morbid obesity. Calculated BMI is 45,5.    DVT prophylaxis: heparin   Code Status: full Family Communication: no family at the bedside  Disposition Plan/ discharge barriers: pending clinical improvement.   Body mass index is 45.54 kg/m. Malnutrition Type:      Malnutrition Characteristics:      Nutrition Interventions:     RN Pressure Injury Documentation:     Consultants:     Procedures:     Antimicrobials:       Subjective: Patient continue to have bilateral ankle pain, more left than right, worse to light touch and movement, sharp in nature, moderate to severe in intensity. Not able to ambulate, at home has significant decreased po intake over last few days before hospitalization.   Objective: Vitals:   08/08/18 1330 08/08/18 1514 08/08/18 2122 08/09/18 0549  BP: 132/71 122/61 123/74 107/72  Pulse: 60 64 (!) 49 (!) 51  Resp: 16  16 20   Temp:  97.7 F (36.5 C) 98 F (36.7 C) 98.3 F (36.8 C)  TempSrc:  Oral Oral Oral  SpO2: 94% 100% 100% 99%  Weight:      Height:        Intake/Output Summary (Last 24 hours) at 08/09/2018 0818 Last data filed at 08/09/2018 0300 Gross per 24 hour  Intake 2642.95 ml  Output -  Net 2642.95 ml   Filed Weights   08/08/18 1003  Weight: 112.9 kg    Examination:   General: deconditioned  Neurology: Awake and alert, non focal  E ENT: mild pallor, no icterus, oral mucosa moist Cardiovascular: No JVD. S1-S2 present, rhythmic, no gallops, rubs, or murmurs. No lower extremity edema. Pulmonary:  Positive breath sounds bilaterally, adequate air movement, no wheezing, rhonchi or rales. Gastrointestinal. Abdomen with no organomegaly, non tender, no rebound or guarding Skin. No rashes  Musculoskeletal: left ankle pain, tender to palpation, decreased range of motion, no increased local temperature. Right ankle with medial erythema and tender to palpation.      Data Reviewed: I have personally reviewed following labs and imaging studies  CBC: Recent Labs  Lab 08/08/18 1102 08/09/18 0432  WBC 11.8* 8.9  NEUTROABS 8.5*  --   HGB 11.5* 10.2*  HCT 37.5 33.5*  MCV 77.2* 76.7*  PLT 449* 419*   Basic Metabolic Panel: Recent Labs  Lab 08/08/18 1102 08/08/18 1540 08/09/18 0432  NA 136  --  140  K 3.3*  --  3.4*  CL 100  --  109  CO2 22  --  21*  GLUCOSE 118*  --  115*  BUN 97*  --  90*  CREATININE 5.31*  --  4.61*  CALCIUM 9.0  --  8.6*  MG  --  2.8*  --   PHOS  --  4.8*  --    GFR: Estimated Creatinine Clearance: 16.4 mL/min (A) (by C-G formula based on SCr of 4.61 mg/dL (H)). Liver Function Tests: No results for input(s): AST, ALT, ALKPHOS, BILITOT, PROT, ALBUMIN in the last 168 hours. No results for input(s): LIPASE, AMYLASE in the last 168 hours. No results for input(s): AMMONIA in the last 168 hours. Coagulation Profile: No results for input(s): INR, PROTIME in the last 168 hours. Cardiac Enzymes: No results for input(s): CKTOTAL, CKMB, CKMBINDEX, TROPONINI in the last 168 hours. BNP (last 3 results) No results for input(s): PROBNP in the last 8760 hours. HbA1C: No results for input(s): HGBA1C in the last 72 hours. CBG: Recent Labs  Lab 08/08/18 1622 08/08/18 2126 08/09/18 0740  GLUCAP 131* 118* 97   Lipid Profile: No results for input(s): CHOL, HDL, LDLCALC, TRIG, CHOLHDL, LDLDIRECT in the last 72 hours. Thyroid Function Tests: No results for input(s): TSH, T4TOTAL, FREET4, T3FREE, THYROIDAB in the last 72 hours. Anemia Panel: No results for input(s): VITAMINB12, FOLATE, FERRITIN, TIBC, IRON, RETICCTPCT in the last 72 hours.    Radiology Studies: I have reviewed all of the imaging during this hospital visit personally      Scheduled Meds: . heparin  5,000 Units Subcutaneous Q8H  . insulin aspart  0-5 Units Subcutaneous QHS  . insulin aspart  0-9 Units Subcutaneous TID WC  . predniSONE  40 mg Oral Q breakfast   Continuous Infusions: . sodium chloride 100 mL/hr at 08/09/18 0253     LOS: 1 day        Mauricio Gerome Apley, MD

## 2018-08-09 NOTE — Plan of Care (Signed)
Patient is progressing with care plan. 

## 2018-08-10 LAB — GLUCOSE, CAPILLARY
Glucose-Capillary: 111 mg/dL — ABNORMAL HIGH (ref 70–99)
Glucose-Capillary: 180 mg/dL — ABNORMAL HIGH (ref 70–99)
Glucose-Capillary: 161 mg/dL — ABNORMAL HIGH (ref 70–99)
Glucose-Capillary: 146 mg/dL — ABNORMAL HIGH (ref 70–99)

## 2018-08-10 LAB — BASIC METABOLIC PANEL
Anion gap: 10 (ref 5–15)
BUN: 87 mg/dL — ABNORMAL HIGH (ref 6–20)
CO2: 22 mmol/L (ref 22–32)
Calcium: 9.3 mg/dL (ref 8.9–10.3)
Chloride: 108 mmol/L (ref 98–111)
Creatinine, Ser: 3.98 mg/dL — ABNORMAL HIGH (ref 0.44–1.00)
GFR calc Af Amer: 14 mL/min — ABNORMAL LOW (ref >60)
GFR calc non Af Amer: 12 mL/min — ABNORMAL LOW (ref >60)
Glucose, Bld: 111 mg/dL — ABNORMAL HIGH (ref 70–99)
Potassium: 3.7 mmol/L (ref 3.5–5.1)
Sodium: 140 mmol/L (ref 135–145)

## 2018-08-10 MED ORDER — COLCHICINE 0.6 MG PO TABS
0.3000 mg | ORAL_TABLET | Freq: Every day | ORAL | Status: DC
  Administered 2018-08-10 – 2018-08-11 (×2): 0.3 mg via ORAL
  Filled 2018-08-10 (×3): qty 0.5
  Filled 2018-08-10: qty 1
  Filled 2018-08-10: qty 0.5
  Filled 2018-08-10: qty 1

## 2018-08-10 MED ORDER — ALLOPURINOL 100 MG PO TABS
50.0000 mg | ORAL_TABLET | Freq: Every day | ORAL | Status: DC
  Administered 2018-08-10 – 2018-08-11 (×2): 50 mg via ORAL
  Filled 2018-08-10 (×2): qty 1

## 2018-08-10 NOTE — Progress Notes (Signed)
PROGRESS NOTE    Margaret Aguilar  TRR:[id number] DOB: Aug 26, 1960 DOA: 08/08/2018 PCP: [name], MD    Brief Narrative:  58 year old female who presented with left ankle pain.  She does have significant past medical history for chronic kidney disease stage IV, hypertension, gouty arthritis and diabetes mellitus.  She reported 5 days of left ankle pain, no trauma associated.  On her initial physical examination her temperature was 97.7, heart rate 64, respiratory 16, oxygen saturation 100%, blood pressure 113/54.  Her lungs were clear to auscultation bilaterally, heart S1-S2 present and rhythmic, abdomen soft nontender, positive left ankle pain.  Sodium 136, potassium 3.3, chloride 100, bicarb 22, glucose 118, BUN 97, creatinine 5.31, anion gap 14, uric acid 17.7, white cell count 11.8, hemoglobin 11.5, hematocrit 37.5, platelets 449, sed rate 103.  Urinalysis specific gravity 1.008, 6-10 white cells, 0-5 red cells, urinary sodium 71.  Left ankle x-ray with no acute osseous findings, mild diffuse soft tissue edema.  Patient was admitted to the hospital with acute gout arthritis at the left ankle complicated by acute kidney injury chronic kidney disease.   Assessment & Plan:   Principal Problem:   Acute on chronic renal failure (HCC) Active Problems:   Hypertension   Diabetes (HCC)   Morbid obesity (HCC)   CKD (chronic kidney disease) stage 4, GFR 15-29 ml/min (HCC)   Gout attack  1. Acute gout flare. Mild improvement of her symptoms, will continue oral prednisone, will add allopurinol and renal dose colchicine. In the setting of CKD she is candidate for maintenance therapy for hyperuricemia.   2. AKI on CKD stage 4 with hypokalemia.  Today's serum cr down to 3,98 from 4,61. K at 3,7 and serum bicarbonate at 22. Will continue IV hypotonic saline hydration for suspected hypovolemic component to AKI. Follow on renal panel in am, avoid hypotension and nephrotoxic medications.   3. HTN.  Blood pressure has remained stable, systolic 333 mmHg. Will continue to hold blood pressure medications, at home on metoprolol, amlodipine-benazepril and HCTZ .   4. T2DM. Fating glucose this am 111, will continue glucose cover and monitoring with insulin sliding scale. Patient is tolerating po well.    5. Morbid obesity. Calculated BMI is 45,5. Will need outpatient follow up and lifestyle modifications.     DVT prophylaxis: heparin   Code Status: full Family Communication: no family at the bedside  Disposition Plan/ discharge barriers: pending clinical improvement, possible dc in am.   Body mass index is 45.54 kg/m. Malnutrition Type:      Malnutrition Characteristics:      Nutrition Interventions:     RN Pressure Injury Documentation:     Consultants:     Procedures:     Antimicrobials:       Subjective: Patient with improved ankle pain, but still not able to ambulate, no nausea or vomiting, no dyspnea, no fever or chills.   Objective: Vitals:   08/09/18 1445 08/09/18 1958 08/09/18 2156 08/10/18 0539  BP: 126/64  (!) 149/92 130/76  Pulse: 65  64 (!) 55  Resp:   18 16  Temp: 98 F (36.7 C)  98.2 F (36.8 C) 97.9 F (36.6 C)  TempSrc: Oral  Oral Oral  SpO2: 99% 96% 93% 99%  Weight:      Height:        Intake/Output Summary (Last 24 hours) at 08/10/2018 0745 Last data filed at 08/10/2018 0600 Gross per 24 hour  Intake 829.66 ml  Output 2700 ml  Net -  1870.34 ml   Filed Weights   08/08/18 1003  Weight: 112.9 kg    Examination:   General: Not in pain or dyspnea.  Neurology: Awake and alert, non focal  E ENT: no pallor, no icterus, oral mucosa moist Cardiovascular: No JVD. S1-S2 present, rhythmic, no gallops, rubs, or murmurs. Trace lower extremity edema. Pulmonary: vesicular breath sounds bilaterally, adequate air movement, no wheezing, rhonchi or rales. Gastrointestinal. Abdomen protuberant, no organomegaly, non tender, no rebound or  guarding Skin. Medial erythema at the ankles.  Musculoskeletal: bilateral ankle edema, more left than right, non pitting, tender to palpation and decreased range of motion due to pain.      Data Reviewed: I have personally reviewed following labs and imaging studies  CBC: Recent Labs  Lab 08/08/18 1102 08/09/18 0432  WBC 11.8* 8.9  NEUTROABS 8.5*  --   HGB 11.5* 10.2*  HCT 37.5 33.5*  MCV 77.2* 76.7*  PLT 449* 194*   Basic Metabolic Panel: Recent Labs  Lab 08/08/18 1102 08/08/18 1540 08/09/18 0432  NA 136  --  140  K 3.3*  --  3.4*  CL 100  --  109  CO2 22  --  21*  GLUCOSE 118*  --  115*  BUN 97*  --  90*  CREATININE 5.31*  --  4.61*  CALCIUM 9.0  --  8.6*  MG  --  2.8*  --   PHOS  --  4.8*  --    GFR: Estimated Creatinine Clearance: 16.4 mL/min (A) (by C-G formula based on SCr of 4.61 mg/dL (H)). Liver Function Tests: No results for input(s): AST, ALT, ALKPHOS, BILITOT, PROT, ALBUMIN in the last 168 hours. No results for input(s): LIPASE, AMYLASE in the last 168 hours. No results for input(s): AMMONIA in the last 168 hours. Coagulation Profile: No results for input(s): INR, PROTIME in the last 168 hours. Cardiac Enzymes: No results for input(s): CKTOTAL, CKMB, CKMBINDEX, TROPONINI in the last 168 hours. BNP (last 3 results) No results for input(s): PROBNP in the last 8760 hours. HbA1C: No results for input(s): HGBA1C in the last 72 hours. CBG: Recent Labs  Lab 08/09/18 0740 08/09/18 1136 08/09/18 1645 08/09/18 2153 08/10/18 0729  GLUCAP 97 122* 143* 139* 111*   Lipid Profile: No results for input(s): CHOL, HDL, LDLCALC, TRIG, CHOLHDL, LDLDIRECT in the last 72 hours. Thyroid Function Tests: No results for input(s): TSH, T4TOTAL, FREET4, T3FREE, THYROIDAB in the last 72 hours. Anemia Panel: No results for input(s): VITAMINB12, FOLATE, FERRITIN, TIBC, IRON, RETICCTPCT in the last 72 hours.    Radiology Studies: I have reviewed all of the imaging  during this hospital visit personally     Scheduled Meds: . heparin  5,000 Units Subcutaneous Q8H  . insulin aspart  0-5 Units Subcutaneous QHS  . insulin aspart  0-9 Units Subcutaneous TID WC  . predniSONE  40 mg Oral Q breakfast   Continuous Infusions: . sodium chloride 50 mL/hr at 08/09/18 1453     LOS: 2 days        Keegan Bensch Gerome Apley, MD

## 2018-08-10 NOTE — Progress Notes (Signed)
Home medication bottles noted on patient's bedside table. Reviewed home medication storage policy with patient. Patient verbalized understanding and consented to have medications stored in pharmacy per policy. Home medications sealed in home medication storage bag, home medication sheet filled out and placed in chart with storage bag receipt. Delivered to pharmacy. Donavan Foil, RN

## 2018-08-10 NOTE — Progress Notes (Signed)
Late entry: Nephrology consult placed this am. Nephrology notified of consult by nursing secretary and on call MD placed on treatment team. Donavan Foil, RN

## 2018-08-11 DIAGNOSIS — E119 Type 2 diabetes mellitus without complications: Secondary | ICD-10-CM

## 2018-08-11 DIAGNOSIS — N184 Chronic kidney disease, stage 4 (severe): Secondary | ICD-10-CM

## 2018-08-11 DIAGNOSIS — N179 Acute kidney failure, unspecified: Secondary | ICD-10-CM

## 2018-08-11 DIAGNOSIS — I10 Essential (primary) hypertension: Secondary | ICD-10-CM

## 2018-08-11 DIAGNOSIS — M10379 Gout due to renal impairment, unspecified ankle and foot: Secondary | ICD-10-CM

## 2018-08-11 LAB — BASIC METABOLIC PANEL
Anion gap: 10 (ref 5–15)
BUN: 89 mg/dL — ABNORMAL HIGH (ref 6–20)
CO2: 21 mmol/L — ABNORMAL LOW (ref 22–32)
Calcium: 9.2 mg/dL (ref 8.9–10.3)
Chloride: 109 mmol/L (ref 98–111)
Creatinine, Ser: 4 mg/dL — ABNORMAL HIGH (ref 0.44–1.00)
GFR calc Af Amer: 14 mL/min — ABNORMAL LOW (ref >60)
GFR calc non Af Amer: 12 mL/min — ABNORMAL LOW (ref >60)
Glucose, Bld: 165 mg/dL — ABNORMAL HIGH (ref 70–99)
Potassium: 4 mmol/L (ref 3.5–5.1)
Sodium: 140 mmol/L (ref 135–145)

## 2018-08-11 LAB — GLUCOSE, CAPILLARY
Glucose-Capillary: 111 mg/dL — ABNORMAL HIGH (ref 70–99)
Glucose-Capillary: 152 mg/dL — ABNORMAL HIGH (ref 70–99)

## 2018-08-11 LAB — SODIUM, URINE, RANDOM: Sodium, Ur: 67 mmol/L

## 2018-08-11 LAB — ANTINUCLEAR ANTIBODIES, IFA: ANA Ab, IFA: NEGATIVE

## 2018-08-11 LAB — CREATININE, URINE, RANDOM: Creatinine, Urine: 58.53 mg/dL

## 2018-08-11 MED ORDER — PREDNISONE 20 MG PO TABS: 40.0000 mg | ORAL_TABLET | Freq: Every day | ORAL | 0 refills | Status: DC

## 2018-08-11 MED ORDER — COLCHICINE 0.6 MG PO TABS: 0.3000 mg | ORAL_TABLET | Freq: Every day | ORAL | 0 refills | Status: DC

## 2018-08-11 MED ORDER — ACETAMINOPHEN 500 MG PO TABS: 1000.0000 mg | ORAL_TABLET | Freq: Four times a day (QID) | ORAL | 0 refills | Status: DC | PRN

## 2018-08-11 MED ORDER — ALLOPURINOL 100 MG PO TABS: 100.0000 mg | ORAL_TABLET | Freq: Every day | ORAL | 0 refills | Status: DC

## 2018-08-11 MED ORDER — METOPROLOL SUCCINATE ER 50 MG PO TB24: 100.0000 mg | ORAL_TABLET | Freq: Every day | ORAL | Status: DC

## 2018-08-11 NOTE — Consult Note (Signed)
Reason for Consult: AKI/CKD stage 4 Referring Physician:  Cathlean Sauer, MD  Margaret Aguilar is an 58 y.o. female.  HPI: Mrs. Niedermeier is a 58yo AAF with PMH significant for longstanding HTN, DM, obesity, CKD stage 4 and DJD who presented to [facility] ED with a 5 day history of severe bilateral ankle pain and inability to ambulate.  She was found to have an elevated uric acid level of 17.7 as well as elevated ESR/CRP, and Cr of 5.31.  She has a history of CKD stage 4 and had been seen by Dr. Lowanda Foster in the past but has not gone back.  She was told of her CKD and did not take any NSAIDs or COX-II I's.  She denies any dysuria, pyuria, hematuria, urgency, frequency, retention.  She also denies any nausea, vomiting, or diarrhea prior to her admission.  She did have a tooth infection a month ago and was on amoxicillin but denied any rashes or fever.    We were consulted to further evaluate her AKI/CKD stage 4.  The trend in Scr is seen below.   Trend in Creatinine: Creatinine, Ser  Date/Time Value Ref Range Status  08/11/2018 04:52 AM 4.00 (H) 0.44 - 1.00 mg/dL Final  08/10/2018 08:53 AM 3.98 (H) 0.44 - 1.00 mg/dL Final  08/09/2018 04:32 AM 4.61 (H) 0.44 - 1.00 mg/dL Final  08/08/2018 11:02 AM 5.31 (H) 0.44 - 1.00 mg/dL Final  06/23/2018 11:27 AM 3.28 (H) 0.44 - 1.00 mg/dL Final  01/12/2018 12:37 PM 2.84 (H) 0.57 - 1.00 mg/dL Final    PMH:   Past Medical History:  Diagnosis Date  . Arthritis   . Diabetes mellitus without complication ([place])    x 5 yrs  . Herpes   . Hypertension   . Kidney disease    Stage 4  . Kidney disease, chronic, stage IV (severe, EGFR 15-29 ml/min) (HCC)     PSH:   Past Surgical History:  Procedure Laterality Date  . CESAREAN SECTION    . COLONOSCOPY N/A 01/12/2014   Procedure: COLONOSCOPY;  Surgeon: Rogene Houston, MD;  Location: [facility];  Service: Endoscopy;  Laterality: N/A;  225  . TUBAL LIGATION      Allergies:  Allergies  Allergen Reactions  . Nutritional  Supplements     walnut  . Latex     Medications:   Prior to Admission medications   Medication Sig Start Date End Date Taking? Authorizing Provider  albuterol (PROVENTIL HFA;VENTOLIN HFA) 108 (90 Base) MCG/ACT inhaler Inhale 2 puffs into the lungs every 6 (six) hours as needed for wheezing or shortness of breath. 01/12/18  Yes Fulp, Cammie, MD  amLODipine-benazepril (LOTREL) 10-20 MG capsule Take 1 capsule by mouth daily. 01/12/18  Yes Fulp, Cammie, MD  Blood Glucose Monitoring Suppl (TRUE METRIX AIR GLUCOSE METER) w/Device KIT 1 Device by Does not apply route 2 (two) times daily at 8 am and 10 pm. 01/12/18  Yes Fulp, Cammie, MD  glucose blood (TRUE METRIX BLOOD GLUCOSE TEST) test strip Use as instructed to check blood sugars twice per day 01/12/18  Yes Fulp, Cammie, MD  hydrochlorothiazide (HYDRODIURIL) 25 MG tablet Take 1 tablet (25 mg total) by mouth daily. 06/23/18  Yes Lily Kocher, PA-C  metoprolol succinate (TOPROL-XL) 100 MG 24 hr tablet Take 1 tablet (100 mg total) by mouth daily. Take with or immediately following a meal. 06/23/18  Yes Lily Kocher, PA-C  traMADol (ULTRAM) 50 MG tablet Take 1 tablet (50 mg total) by mouth every  6 (six) hours as needed. 06/23/18  Yes Lily Kocher, PA-C  TRUEPLUS LANCETS 28G MISC Use to check blood sugar twice per day 01/12/18  Yes Fulp, Cammie, MD  acetaminophen (TYLENOL) 500 MG tablet Take 2 tablets (1,000 mg total) by mouth every 6 (six) hours as needed. 08/11/18   Johnson, Clanford L, MD  allopurinol (ZYLOPRIM) 100 MG tablet Take 1 tablet (100 mg total) by mouth daily for 30 days. 08/12/18 09/11/18  Johnson, Clanford L, MD  colchicine 0.6 MG tablet Take 0.5 tablets (0.3 mg total) by mouth daily for 30 days. 08/12/18 09/11/18  Johnson, Clanford L, MD  predniSONE (DELTASONE) 20 MG tablet Take 2 tablets (40 mg total) by mouth daily with breakfast for 5 days. 08/12/18 08/17/18  [name], MD    Inpatient medications: . allopurinol  50 mg Oral Daily  .  colchicine  0.3 mg Oral Daily  . heparin  5,000 Units Subcutaneous Q8H  . insulin aspart  0-5 Units Subcutaneous QHS  . insulin aspart  0-9 Units Subcutaneous TID WC  . predniSONE  40 mg Oral Q breakfast    Discontinued Meds:   Medications Discontinued During This Encounter  Medication Reason  . amoxicillin (AMOXIL) 500 MG capsule Completed Course  . colchicine tablet 0.6 mg   . 0.9 %  sodium chloride infusion   . acetaminophen (TYLENOL) 500 MG tablet Reorder  . metoprolol succinate (TOPROL-XL) 24 hr tablet 100 mg     Social History:  reports that she has never smoked. She has never used smokeless tobacco. She reports that she does not drink alcohol or use drugs.  Family History:   Family History  Problem Relation Age of Onset  . Diabetes Mother   . Hyperlipidemia Mother   . Hyperlipidemia Father   . Hypertension Father   . Stroke Brother   . Diabetes Brother     Pertinent items are noted in HPI. Weight change:   Intake/Output Summary (Last 24 hours) at 08/11/2018 1114 Last data filed at 08/11/2018 0841 Gross per 24 hour  Intake 1925 ml  Output 800 ml  Net 1125 ml   BP (!) 162/97 (BP Location: Left Arm)   Pulse (!) 41   Temp 97.7 F (36.5 C) (Oral)   Resp 18   Ht '5\' 2"'  (1.575 m)   Wt 112.9 kg   SpO2 100%   BMI 45.54 kg/m  Vitals:   08/10/18 1335 08/10/18 2114 08/10/18 2352 08/11/18 0514  BP:  133/64  (!) 162/97  Pulse: 65 63 70 (!) 41  Resp:  '16 16 18  ' Temp:  98.3 F (36.8 C)  97.7 F (36.5 C)  TempSrc:  Oral  Oral  SpO2:  96% 95% 100%  Weight:      Height:         General appearance: alert and no distress Head: Normocephalic, without obvious abnormality, atraumatic Resp: clear to auscultation bilaterally Cardio: regular rate and rhythm, S1, S2 normal, no murmur, click, rub or gallop GI: soft, non-tender; bowel sounds normal; no masses,  no organomegaly Extremities: some mild edema, tenderness around right ankle > left  Labs: Basic Metabolic  Panel: Recent Labs  Lab 08/08/18 1102 08/08/18 1540 08/09/18 0432 08/10/18 0853 08/11/18 0452  NA 136  --  140 140 140  K 3.3*  --  3.4* 3.7 4.0  CL 100  --  109 108 109  CO2 22  --  21* 22 21*  GLUCOSE 118*  --  115* 111* 165*  BUN 97*  --  90* 87* 89*  CREATININE 5.31*  --  4.61* 3.98* 4.00*  CALCIUM 9.0  --  8.6* 9.3 9.2  PHOS  --  4.8*  --   --   --    Liver Function Tests: No results for input(s): AST, ALT, ALKPHOS, BILITOT, PROT, ALBUMIN in the last 168 hours. No results for input(s): LIPASE, AMYLASE in the last 168 hours. No results for input(s): AMMONIA in the last 168 hours. CBC: Recent Labs  Lab 08/08/18 1102 08/09/18 0432  WBC 11.8* 8.9  NEUTROABS 8.5*  --   HGB 11.5* 10.2*  HCT 37.5 33.5*  MCV 77.2* 76.7*  PLT 449* 440*   PT/INR: '@LABRCNTIP' (inr:5) Cardiac Enzymes: )No results for input(s): CKTOTAL, CKMB, CKMBINDEX, TROPONINI in the last 168 hours. CBG: Recent Labs  Lab 08/10/18 0729 08/10/18 1137 08/10/18 1600 08/10/18 2114 08/11/18 0740  GLUCAP 111* 180* 161* 146* 111*    Iron Studies: No results for input(s): IRON, TIBC, TRANSFERRIN, FERRITIN in the last 168 hours.  Xrays/Other Studies: No results found.   Assessment/Plan: 1.  AKI/CKD stage 4- unclear etiology other than presumed ATN.  Renal US with increased echogenicity and without obstruction.  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  Will check urine uric acid levels.  Another possibility could have been rhado since she was sitting in a recliner for 5 days.  Thankfully her Cr is improving.  Her ACE- inhibitor was placed on hold due to AKI and agree with continuing to hold this until she can be seen as an outpatient in our office for follow up.   2. Gout flare- improving.  Continue with prednisone taper 3. HTN- stable without lotrel and hctz. Will need to f/u as an outpatient 4. DM- per primary svc 5. Morbid obesity- discussed the need for diet and weight loss 6. Anemia of CKD stage 4- will need  outpatient follow up for IV iron +/- ESA. 7. Disposition- will arrange for follow up in our office in the next 2 weeks   Donetta Potts 08/11/2018, 11:14 AM

## 2018-08-11 NOTE — Evaluation (Signed)
Physical Therapy Evaluation Patient Details Name: Margaret Aguilar MRN: [id number] DOB: 10/17/1960 Today's Date: 08/11/2018   History of Present Illness  Patient is a 58 year old female with past medical history significant for CKD stage IV, hypertension, gouty arthritis and diabetes mellitus.  Patient presents with a 5-day history of mainly left ankle pain.  No associated fever or chills.  No trauma to the legs.  No headache, no neck pain, no URI symptoms, no GI symptoms and no urinary symptoms.  On presentation to the hospital, ankle x-ray done was done was nonrevealing.  However, worsening renal function was noted.  BUN on presentation was 97 and serum creatinine was 5.31 (a month ago, BUN was 37 and serum creatinine was 3.28).  CRP is elevated at 12.7, sed rate is elevated at 103.  Poor p.o. intake is reported.  Patient be admitted for further assessment and management.    Clinical Impression  Patient functioning near baseline for functional mobility and gait as compared to last 7 months since initial fall which injured her left ankle.  Patient requires use RW for sit to stands, transfers, and taking steps due to increased pain on LLE when weightbearing, able to ambulate outside of room and back to bedside with slow labored cadence without loss of balance.  Patient encouraged to do BLE ROM exercises and ambulate as tolerated with family members assisting when she returns home.  Patient to be discharged home today and discharged from physical therapy to care of nursing for ambulation daily as tolerated for length of stay.     Follow Up Recommendations Home health PT;Supervision for mobility/OOB;Supervision - Intermittent    Equipment Recommendations  3in1 (PT)    Recommendations for Other Services       Precautions / Restrictions Precautions Precautions: Fall Restrictions Weight Bearing Restrictions: No      Mobility  Bed Mobility Overal bed mobility: Modified Independent              General bed mobility comments: increased time, head of bed raised  Transfers Overall transfer level: Needs assistance Equipment used: Rolling walker (2 wheeled) Transfers: Sit to/from [facility] Sit to Stand: Min guard Stand pivot transfers: Min guard       General transfer comment: increased time, labored movement  Ambulation/Gait Ambulation/Gait assistance: Min guard Gait Distance (Feet): 30 Feet Assistive device: Rolling walker (2 wheeled) Gait Pattern/deviations: Decreased step length - right;Decreased step length - left;Decreased stride length;Decreased stance time - left;Antalgic Gait velocity: decreased   General Gait Details: labored slow cadence with limited weightbearing on LLE due to increased ankle pain, no loss of balance  Stairs            Wheelchair Mobility    Modified Rankin (Stroke Patients Only)       Balance Overall balance assessment: Needs assistance Sitting-balance support: Feet supported;No upper extremity supported Sitting balance-Leahy Scale: Good     Standing balance support: Bilateral upper extremity supported;During functional activity Standing balance-Leahy Scale: Fair Standing balance comment: using RW                             Pertinent Vitals/Pain Pain Assessment: 0-10 Pain Score: 6  Pain Location: left ankle foot mostly due to swelling, mild pain in right foot Pain Descriptors / Indicators: Aching;Sore Pain Intervention(s): Limited activity within patient's tolerance;Monitored during session    [place] expects to be discharged to:: Private residence Living Arrangements: Spouse/significant other;Other relatives Available  Help at Discharge: Family Type of Home: House Home Access: Stairs to enter Entrance Stairs-Rails: Right;Can reach both Technical brewer of Steps: 3 Home Layout: One level Home Equipment: Environmental consultant - 2 wheels      Prior Function Level of  Independence: Independent with assistive device(s)         Comments: household ambulator using Rollator PRN for last 7 months due to fall with injury to left ankle, prior to this was independent coummunity Ecologist Dominance        Extremity/Trunk Assessment   Upper Extremity Assessment Upper Extremity Assessment: Overall WFL for tasks assessed    Lower Extremity Assessment Lower Extremity Assessment: Generalized weakness    Cervical / Trunk Assessment Cervical / Trunk Assessment: Normal  Communication   Communication: No difficulties  Cognition Arousal/Alertness: Awake/alert Behavior During Therapy: WFL for tasks assessed/performed Overall Cognitive Status: Within Functional Limits for tasks assessed                                        General Comments      Exercises     Assessment/Plan    PT Assessment All further PT needs can be met in the next venue of care  PT Problem List Decreased strength;Decreased range of motion;Decreased activity tolerance;Decreased balance;Decreased mobility;Pain       PT Treatment Interventions      PT Goals (Current goals can be found in the Care Plan section)  Acute Rehab PT Goals Patient Stated Goal: return home with family to assist PT Goal Formulation: With patient Time For Goal Achievement: 08/11/18 Potential to Achieve Goals: Good    Frequency     Barriers to discharge        Co-evaluation               AM-PAC PT "6 Clicks" Mobility  Outcome Measure Help needed turning from your back to your side while in a flat bed without using bedrails?: None Help needed moving from lying on your back to sitting on the side of a flat bed without using bedrails?: None Help needed moving to and from a bed to a chair (including a wheelchair)?: A Little Help needed standing up from a chair using your arms (e.g., wheelchair or bedside chair)?: A Little Help needed to walk in hospital room?: A  Little Help needed climbing 3-5 steps with a railing? : A Lot 6 Click Score: 19    End of Session   Activity Tolerance: Patient tolerated treatment well;Patient limited by fatigue Patient left: in bed;with call bell/phone within reach(seated at bedside) Nurse Communication: Mobility status PT Visit Diagnosis: Unsteadiness on feet (R26.81);Other abnormalities of gait and mobility (R26.89);Muscle weakness (generalized) (M62.81)    Time: 3845-3646 PT Time Calculation (min) (ACUTE ONLY): 27 min   Charges:   PT Evaluation $PT Eval Moderate Complexity: 1 Mod PT Treatments $Therapeutic Activity: 23-37 mins        12:38 PM, 08/11/18 Lonell Grandchild, MPT Physical Therapist with [facility] [phone] office [phone] mobile phone

## 2018-08-11 NOTE — TOC Transition Note (Signed)
Transition of Care [facility]) - CM/SW Discharge Note   Patient Details  Name: Margaret Aguilar MRN: [id number] Date of Birth: 06-08-1960  Transition of Care [facility]) CM/SW Contact:  Boneta Lucks, RN Phone Number: 08/11/2018, 11:35 AM   Clinical Narrative:   Patient was admitted for renal failure, being discharge today with plan to follow up with nephrology and pcp. Pt also has chronic ankle pain, used walker at home. Live with her husband, he provides transportation. [facility] uses [facility] for pcp and medications due to not having insurance. Discuss with patient the orders for Home health PT, she is states she needs these services, asking for out of pocket cost. Patient states she plans to file for Medicaid and if denied she will seek private insurance.  Consult given to [facility] with [place] for PT as a charity. Vaughan Basta will see patient before discharge.      Expected Discharge Plan: [place] Barriers to Discharge: Barriers Resolved   Patient Goals and CMS Choice Patient states their goals for this hospitalization and ongoing recovery are:: Pt wishes to discharge home with home health PT.      Expected Discharge Plan and Services Expected Discharge Plan: [place]   Discharge Planning Services: CM Consult Post Acute Care Choice: [place] arrangements for the past 2 months: [place] Expected Discharge Date: 08/11/18                         HH Arranged: PT [place]: [place] ([place]) Date [place]: 08/11/18 Time [place]: 1128 Representative spoke with at [place]: [place] Arrangements/Services Living arrangements for the past 2 months: [place] with:: Minor Children, Spouse Patient language and need for interpreter reviewed:: No Do you feel safe going back to the place where you live?: Yes      Need for Family Participation in Patient  Care: Yes (Comment) Care giver support system in place?: Yes (comment)   Criminal Activity/Legal Involvement Pertinent to Current Situation/Hospitalization: No - Comment as needed  Activities of Daily Living Home Assistive Devices/Equipment: Eyeglasses, Environmental consultant (specify type), Dentures (specify type) ADL Screening (condition at time of admission) Patient's cognitive ability adequate to safely complete daily activities?: Yes Is the patient deaf or have difficulty hearing?: No Does the patient have difficulty seeing, even when wearing glasses/contacts?: No Does the patient have difficulty concentrating, remembering, or making decisions?: No Patient able to express need for assistance with ADLs?: No Does the patient have difficulty dressing or bathing?: No Independently performs ADLs?: Yes (appropriate for developmental age) Does the patient have difficulty walking or climbing stairs?: No Weakness of Legs: Both Weakness of Arms/Hands: None  Permission Sought/Granted Permission sought to share information with : Case Manager Permission granted to share information with : Yes, Verbal Permission Granted  Share Information with NAME: Romualdo Bolk  Permission granted to share info w AGENCY: [facility]        Emotional Assessment     Affect (typically observed): Accepting, Calm, Pleasant Orientation: : Oriented to Self, Oriented to  Time, Oriented to Place, Oriented to Situation Alcohol / Substance Use: Not Applicable Psych Involvement: No (comment)  Admission diagnosis:  Dehydration [E86.0] Inflammatory arthritis [M19.90] AKI (acute kidney injury) ([place]) [N17.9] Patient Active Problem List   Diagnosis Date Noted  . Gout attack 08/09/2018  . Acute on chronic renal failure ([place]) 08/08/2018  .  CKD (chronic kidney disease) stage 4, GFR 15-29 ml/min (HCC) 01/09/2017  . Spondylosis of lumbosacral region 10/09/2016  . Type 2 diabetes mellitus, without long-term current use of  insulin ([place]) 09/18/2016  . IDA (iron deficiency anemia) 12/14/2013  . Guaiac + stool 12/14/2013  . Hypertension 09/28/2013  . Diabetes ([place]) 09/28/2013  . Renal insufficiency 09/28/2013  . Morbid obesity ([place]) 09/28/2013  . Obstructive sleep apnea on CPAP 06/05/2011  . Hyperlipidemia 06/05/2011  . Gastroesophageal reflux disease 06/05/2011  . Arthritis 06/05/2011   PCP:  [name], MD Pharmacy:   [id number] [place], [address] [place] [address] Phone: [phone] Fax: [place], [place] [address] Phone: [phone] Fax: [employer], [place] [address] [place] [address] Phone: [phone] Fax: [phone]

## 2018-08-11 NOTE — Care Management (Signed)
Notified from PT verbally that patient will need a BSC. Call to [facility] to notify of need for charity. Message to attending of need for [facility] order. Patient has left without being seen by Juliann Pulse of [facility].  Notified Juliann Pulse of [facility] who will try to f/u with patient via phone in regards to qualifying for charity for [facility].

## 2018-08-11 NOTE — Plan of Care (Signed)
Patient progressing with care plan. 

## 2018-08-11 NOTE — Discharge Summary (Signed)
Physician Discharge Summary  Mikesha Migliaccio [id number] DOB: 11-18-1960 DOA: 08/08/2018  PCP: [name], MD  Admit date: 08/08/2018 Discharge date: 08/11/2018  Admitted From: Home  Disposition: Home   Recommendations for Outpatient Follow-up:  1. Follow up with PCP in 1 weeks 2. Please follow up with nephrologist in 2 weeks  Home Health:  Discharge Condition: STABLE   CODE STATUS: FULL    Brief Hospitalization Summary: Please see all hospital notes, images, labs for full details of the hospitalization. Dr. Kyra Searles HPI: Patient is a 58 year old female with past medical history significant for CKD stage IV, hypertension, gouty arthritis and diabetes mellitus.  Patient presents with a 5-day history of mainly left ankle pain.  No associated fever or chills.  No trauma to the legs.  No headache, no neck pain, no URI symptoms, no GI symptoms and no urinary symptoms.  On presentation to the hospital, ankle x-ray done was done was nonrevealing.  However, worsening renal function was noted.  BUN on presentation was 97 and serum creatinine was 5.31 (a month ago, BUN was 37 and serum creatinine was 3.28).  CRP is elevated at 12.7, sed rate is elevated at 103.  Poor p.o. intake is reported.  Patient be admitted for further assessment and management.  ED Course: Vitals on presentation revealed temperature of 97.7, blood pressure of 05/06/1957, heart rate of 64, respiratory rate of 16 and O2 sat of 100%.  Mildly elevated WBC was noted, elevated CRP and sed rate were also noted.  Uric acid level was elevated.  Worsening renal function was noted.  X-ray of the ankle done by the ER provider was nonrevealing.  Hospitalist team was asked to admit patient for further assessment and management.  Pertinent labs: BC reveals WBC of 11.8, hemoglobin of 11.5, hematocrit of 37.5, MCV of 77.2 platelet count of 149.  Chemistry reveals sodium of 136, potassium of 3.3, chloride 100, CO2 20, BUN of 97 and creatinine of  5.31.  Blood sugar was 118.  Sed rate of 103, CRP of 12.7 and uric acid of 9.5.  Imaging: independently reviewed.  Ankle x-ray was nonrevealing.  The patient was admitted with an acute exacerbation of gout mostly involving the ankles.  She was treated with prednisone with some improvement in symptoms.  She is ambulating much better now and feeling better.  She feels like she can manage at home now.  She will have outpatient follow-up with her nephrologist and her PCP.  She was noted on admission to have acute on chronic CKD stage IV with hypokalemia.  She was given some IV fluid hydration with some improvement in her creatinine and her hypokalemia was repleted.  Her amlodipine benazepril has been held in addition to HCTZ due to recurrent gout and worsening CKD.  Close follow-up with PCP and nephrologist recommended.  Her type II diabetes mellitus with renal complication was monitored closely and has remained stable.   Discharge Diagnoses:  Principal Problem:   Acute on chronic renal failure (HCC) Active Problems:   Hypertension   Diabetes (HCC)   Morbid obesity (HCC)   CKD (chronic kidney disease) stage 4, GFR 15-29 ml/min (HCC)   Gout attack   Discharge Instructions: Discharge Instructions    Call MD for:  difficulty breathing, headache or visual disturbances   Complete by:  As directed    Call MD for:  extreme fatigue   Complete by:  As directed    Call MD for:  persistant dizziness or light-headedness   Complete  by:  As directed    Call MD for:  persistant nausea and vomiting   Complete by:  As directed    Call MD for:  severe uncontrolled pain   Complete by:  As directed    Increase activity slowly   Complete by:  As directed      Allergies as of 08/11/2018      Reactions   Nutritional Supplements    walnut   Latex       Medication List    STOP taking these medications   amLODipine-benazepril 10-20 MG capsule Commonly known as:  LOTREL   hydrochlorothiazide 25 MG  tablet Commonly known as:  HYDRODIURIL     TAKE these medications   acetaminophen 500 MG tablet Commonly known as:  TYLENOL Take 2 tablets (1,000 mg total) by mouth every 6 (six) hours as needed. What changed:  how much to take   albuterol 108 (90 Base) MCG/ACT inhaler Commonly known as:  VENTOLIN HFA Inhale 2 puffs into the lungs every 6 (six) hours as needed for wheezing or shortness of breath.   allopurinol 100 MG tablet Commonly known as:  ZYLOPRIM Take 1 tablet (100 mg total) by mouth daily for 30 days. Start taking on:  August 12, 2018   colchicine 0.6 MG tablet Take 0.5 tablets (0.3 mg total) by mouth daily for 30 days. Start taking on:  August 12, 2018   glucose blood test strip Commonly known as:  True Metrix Blood Glucose Test Use as instructed to check blood sugars twice per day   metoprolol succinate 100 MG 24 hr tablet Commonly known as:  TOPROL-XL Take 1 tablet (100 mg total) by mouth daily. Take with or immediately following a meal.   predniSONE 20 MG tablet Commonly known as:  DELTASONE Take 2 tablets (40 mg total) by mouth daily with breakfast for 5 days. Start taking on:  August 12, 2018   traMADol 50 MG tablet Commonly known as:  ULTRAM Take 1 tablet (50 mg total) by mouth every 6 (six) hours as needed.   True Metrix Air Glucose Meter w/Device Kit 1 Device by Does not apply route 2 (two) times daily at 8 am and 10 pm.   TRUEplus Lancets 28G Misc Use to check blood sugar twice per day      Follow-up Information    [name], MD. Schedule an appointment as soon as possible for a visit in 1 week(s).   Specialty:  Cardiology Why:  Hospital follow-up Contact information: [address] [place] [address] [phone]        Fran Lowes, MD. Schedule an appointment as soon as possible for a visit in 1 week(s).   Specialty:  Nephrology Why:  Hospital follow-up Contact information: [address] [address] [phone]        Antony Blackbird, MD. Schedule an appointment as soon as possible for a visit in 1 week(s).   Specialty:  Family Medicine Why:  Hospital Follow Up  Contact information: [address] [place] [address] [phone]          Allergies  Allergen Reactions  . Nutritional Supplements     walnut  . Latex    Allergies as of 08/11/2018      Reactions   Nutritional Supplements    walnut   Latex       Medication List    STOP taking these medications   amLODipine-benazepril 10-20 MG capsule Commonly  known as:  LOTREL   hydrochlorothiazide 25 MG tablet Commonly known as:  HYDRODIURIL     TAKE these medications   acetaminophen 500 MG tablet Commonly known as:  TYLENOL Take 2 tablets (1,000 mg total) by mouth every 6 (six) hours as needed. What changed:  how much to take   albuterol 108 (90 Base) MCG/ACT inhaler Commonly known as:  VENTOLIN HFA Inhale 2 puffs into the lungs every 6 (six) hours as needed for wheezing or shortness of breath.   allopurinol 100 MG tablet Commonly known as:  ZYLOPRIM Take 1 tablet (100 mg total) by mouth daily for 30 days. Start taking on:  August 12, 2018   colchicine 0.6 MG tablet Take 0.5 tablets (0.3 mg total) by mouth daily for 30 days. Start taking on:  August 12, 2018   glucose blood test strip Commonly known as:  True Metrix Blood Glucose Test Use as instructed to check blood sugars twice per day   metoprolol succinate 100 MG 24 hr tablet Commonly known as:  TOPROL-XL Take 1 tablet (100 mg total) by mouth daily. Take with or immediately following a meal.   predniSONE 20 MG tablet Commonly known as:  DELTASONE Take 2 tablets (40 mg total) by mouth daily with breakfast for 5 days. Start taking on:  August 12, 2018   traMADol 50 MG tablet Commonly known as:  ULTRAM Take 1 tablet (50 mg total) by mouth every 6 (six) hours as needed.   True Metrix Air Glucose Meter  w/Device Kit 1 Device by Does not apply route 2 (two) times daily at 8 am and 10 pm.   TRUEplus Lancets 28G Misc Use to check blood sugar twice per day       Procedures/Studies: Dg Ankle Complete Left  Result Date: 08/08/2018 CLINICAL DATA:  Left ankle pain for 5 days. No known injury. History of diabetes and chronic kidney disease. EXAM: LEFT ANKLE COMPLETE - 3+ VIEW COMPARISON:  Foot radiographs 07/04/2016. Ankle radiographs 12/29/2013. FINDINGS: The mineralization and alignment are normal. There is no evidence of acute fracture or dislocation. The joint spaces are preserved. Mild diffuse soft tissue swelling without evidence of foreign body or soft tissue emphysema. There are stable small calcaneal spurs. IMPRESSION: No acute osseous findings.  Mild diffuse soft tissue swelling. Electronically Signed   By: Richardean Sale M.D.   On: 08/08/2018 12:54   US Renal  Result Date: 08/09/2018 CLINICAL DATA:  Acute kidney injury. EXAM: RENAL / URINARY TRACT ULTRASOUND COMPLETE COMPARISON:  Ultrasound of November 06, 2014. FINDINGS: Right Kidney: Renal measurements: 9.1 x 4.2 x 3.8 cm = volume: 77 mL. Increased echogenicity of renal parenchyma is noted. 1.4 cm simple cyst is noted in midpole. No mass or hydronephrosis visualized. Left Kidney: Renal measurements: 9.8 x 5.0 x 4.9 cm = volume: 126 mL. Increased echogenicity of renal parenchyma is noted. 9 mm simple cyst is seen in upper pole. No mass or hydronephrosis visualized. Bladder: Appears normal for degree of bladder distention. Ureteral jets are not visualized. IMPRESSION: Increased echogenicity of renal parenchyma is noted bilaterally suggesting medical renal disease. No hydronephrosis or renal obstruction is noted. Electronically Signed   By: Marijo Conception M.D.   On: 08/09/2018 09:17      Subjective: The patient says that she is ambulating much better now than she had been.  She is feeling a lot better.  She has been able to go to the bathroom  which is a huge improvement.  Discharge  Exam: Vitals:   08/10/18 2352 08/11/18 0514  BP:  (!) 162/97  Pulse: 70 (!) 41  Resp: 16 18  Temp:  97.7 F (36.5 C)  SpO2: 95% 100%   Vitals:   08/10/18 1335 08/10/18 2114 08/10/18 2352 08/11/18 0514  BP:  133/64  (!) 162/97  Pulse: 65 63 70 (!) 41  Resp:  _0 Temp:  98.3 F (36.8 C)  97.7 F (36.5 C)  TempSrc:  Oral  Oral  SpO2:  96% 95% 100%  Weight:      Height:       General: Pt is alert, awake, not in acute distress Cardiovascular: RRR, S1/S2 +, no rubs, no gallops Respiratory: CTA bilaterally, no wheezing, no rhonchi Abdominal: Soft, NT, ND, bowel sounds + Extremities: Tender ankles bilaterally but full range of motion no crepitus   The results of significant diagnostics from this hospitalization (including imaging, microbiology, ancillary and laboratory) are listed below for reference.     Microbiology: No results found for this or any previous visit (from the past 240 hour(s)).   Labs: BNP (last 3 results) No results for input(s): BNP in the last 8760 hours. Basic Metabolic Panel: Recent Labs  Lab 08/08/18 1102 08/08/18 1540 08/09/18 0432 08/10/18 0853 08/11/18 0452  NA 136  --  140 140 140  K 3.3*  --  3.4* 3.7 4.0  CL 100  --  109 108 109  CO2 22  --  21* 22 21*  GLUCOSE 118*  --  115* 111* 165*  BUN 97*  --  90* 87* 89*  CREATININE 5.31*  --  4.61* 3.98* 4.00*  CALCIUM 9.0  --  8.6* 9.3 9.2  MG  --  2.8*  --   --   --   PHOS  --  4.8*  --   --   --    Liver Function Tests: No results for input(s): AST, ALT, ALKPHOS, BILITOT, PROT, ALBUMIN in the last 168 hours. No results for input(s): LIPASE, AMYLASE in the last 168 hours. No results for input(s): AMMONIA in the last 168 hours. CBC: Recent Labs  Lab 08/08/18 1102 08/09/18 0432  WBC 11.8* 8.9  NEUTROABS 8.5*  --   HGB 11.5* 10.2*  HCT 37.5 33.5*  MCV 77.2* 76.7*  PLT 449* 440*   Cardiac Enzymes: No results for input(s): CKTOTAL, CKMB,  CKMBINDEX, TROPONINI in the last 168 hours. BNP: Invalid input(s): POCBNP CBG: Recent Labs  Lab 08/10/18 0729 08/10/18 1137 08/10/18 1600 08/10/18 2114 08/11/18 0740  GLUCAP 111* 180* 161* 146* 111*   D-Dimer No results for input(s): DDIMER in the last 72 hours. Hgb A1c No results for input(s): HGBA1C in the last 72 hours. Lipid Profile No results for input(s): CHOL, HDL, LDLCALC, TRIG, CHOLHDL, LDLDIRECT in the last 72 hours. Thyroid function studies No results for input(s): TSH, T4TOTAL, T3FREE, THYROIDAB in the last 72 hours.  Invalid input(s): FREET3 Anemia work up No results for input(s): VITAMINB12, FOLATE, FERRITIN, TIBC, IRON, RETICCTPCT in the last 72 hours. Urinalysis    Component Value Date/Time   COLORURINE YELLOW 08/08/2018 1438   APPEARANCEUR HAZY (A) 08/08/2018 1438   LABSPEC 1.008 08/08/2018 1438   PHURINE 5.0 08/08/2018 1438   GLUCOSEU NEGATIVE 08/08/2018 1438   HGBUR SMALL (A) 08/08/2018 1438   BILIRUBINUR NEGATIVE 08/08/2018 1438   KETONESUR NEGATIVE 08/08/2018 1438   PROTEINUR NEGATIVE 08/08/2018 1438   NITRITE NEGATIVE 08/08/2018 1438   LEUKOCYTESUR NEGATIVE 08/08/2018 1438   Sepsis  Labs Invalid input(s): PROCALCITONIN,  WBC,  LACTICIDVEN Microbiology No results found for this or any previous visit (from the past 240 hour(s)).  Time coordinating discharge:   SIGNED:  Irwin Brakeman, MD  [facility] 08/11/2018, 11:03 AM How to contact the [facility] Attending or Consulting provider [place] or covering provider during after hours [place], for this patient?  1. Check the care team in [facility] and look for a) attending/consulting [facility] provider listed and b) the [facility] team listed 2. Log into [email or web address] and use [place]'s universal password to access. If you do not have the password, please contact the hospital operator. 3. Locate the [facility] provider you are looking for under [facility] and page to a number that you can be directly  reached. 4. If you still have difficulty reaching the provider, please page the [facility] (Director on Call) for the Hospitalists listed on amion for assistance.

## 2018-08-11 NOTE — Discharge Instructions (Signed)
Acute Kidney Injury, Adult  Acute kidney injury is a sudden worsening of kidney function. The kidneys are organs that have several jobs. They filter the blood to remove waste products and extra fluid. They also maintain a healthy balance of minerals and hormones in the body, which helps control blood pressure and keep bones strong. With this condition, your kidneys do not do their jobs as well as they should. This condition ranges from mild to severe. Over time it may develop into long-lasting (chronic) kidney disease. Early detection and treatment may prevent acute kidney injury from developing into a chronic condition. What are the causes? Common causes of this condition include:  A problem with blood flow to the kidneys. This may be caused by: ? Low blood pressure (hypotension) or shock. ? Blood loss. ? Heart and blood vessel (cardiovascular) disease. ? Severe burns. ? Liver disease.  Direct damage to the kidneys. This may be caused by: ? Certain medicines. ? A kidney infection. ? Poisoning. ? Being around or in contact with toxic substances. ? A surgical wound. ? A hard, direct hit to the kidney area.  A sudden blockage of urine flow. This may be caused by: ? Cancer. ? Kidney stones. ? An enlarged prostate in males. What are the signs or symptoms? Symptoms of this condition may not be obvious until the condition becomes severe. Symptoms of this condition can include:  Tiredness (lethargy), or difficulty staying awake.  Nausea or vomiting.  Swelling (edema) of the face, legs, ankles, or feet.  Problems with urination, such as: ? Abdominal pain, or pain along the side of your stomach (flank). ? Decreased urine production. ? Decrease in the force of urine flow.  Muscle twitches and cramps, especially in the legs.  Confusion or trouble concentrating.  Loss of appetite.  Fever. How is this diagnosed? This condition may be diagnosed with tests, including:  Blood  tests.  Urine tests.  Imaging tests.  A test in which a sample of tissue is removed from the kidneys to be examined under a microscope (kidney biopsy). How is this treated? Treatment for this condition depends on the cause and how severe the condition is. In mild cases, treatment may not be needed. The kidneys may heal on their own. In more severe cases, treatment will involve:  Treating the cause of the kidney injury. This may involve changing any medicines you are taking or adjusting your dosage.  Fluids. You may need specialized IV fluids to balance your body's needs.  Having a catheter placed to drain urine and prevent blockages.  Preventing problems from occurring. This may mean avoiding certain medicines or procedures that can cause further injury to the kidneys. In some cases treatment may also require:  A procedure to remove toxic wastes from the body (dialysis or continuous renal replacement therapy - CRRT).  Surgery. This may be done to repair a torn kidney, or to remove the blockage from the urinary system. Follow these instructions at home: Medicines  Take over-the-counter and prescription medicines only as told by your health care provider.  Do not take any new medicines without your health care provider's approval. Many medicines can worsen your kidney damage.  Do not take any vitamin and mineral supplements without your health care provider's approval. Many nutritional supplements can worsen your kidney damage. Lifestyle  If your health care provider prescribed changes to your diet, follow them. You may need to decrease the amount of protein you eat.  Achieve and maintain a  healthy weight. If you need help with this, ask your health care provider.  Start or continue an exercise plan. Try to exercise at least 30 minutes a day, 5 days a week.  Do not use any tobacco products, such as cigarettes, chewing tobacco, and e-cigarettes. If you need help quitting, ask your  health care provider. General instructions  Keep track of your blood pressure. Report changes in your blood pressure as told by your health care provider.  Stay up to date with immunizations. Ask your health care provider which immunizations you need.  Keep all follow-up visits as told by your health care provider. This is important. Where to find more information  American Association of Kidney Patients: BombTimer.gl  National Kidney Foundation: www.kidney.[place]: https://mathis.com/  Life Options [facility]: ? www.lifeoptions.org ? www.kidneyschool.org Contact a health care provider if:  Your symptoms get worse.  You develop new symptoms. Get help right away if:  You develop symptoms of worsening kidney disease, which include: ? Headaches. ? Abnormally dark or light skin. ? Easy bruising. ? Frequent hiccups. ? Chest pain. ? Shortness of breath. ? End of menstruation in women. ? Seizures. ? Confusion or altered mental status. ? Abdominal or back pain. ? Itchiness.  You have a fever.  Your body is producing less urine.  You have pain or bleeding when you urinate. Summary  Acute kidney injury is a sudden worsening of kidney function.  Acute kidney injury can be caused by problems with blood flow to the kidneys, direct damage to the kidneys, and sudden blockage of urine flow.  Symptoms of this condition may not be obvious until it becomes severe. Symptoms may include edema, lethargy, confusion, nausea or vomiting, and problems passing urine.  This condition can usually be diagnosed with blood tests, urine tests, and imaging tests. Sometimes a kidney biopsy is done to diagnose this condition.  Treatment for this condition often involves treating the underlying cause. It is treated with fluids, medicines, dialysis, diet changes, or surgery. This information is not intended to replace advice given to you by your health care provider. Make  sure you discuss any questions you have with your health care provider. Document Released: 10/14/2010 Document Revised: 07/31/2016 Document Reviewed: 03/21/2016 Elsevier Interactive Patient Education  2019 [place]. Gout  Gout is painful swelling of your joints. Gout is a type of arthritis. It is caused by having too much uric acid in your body. Uric acid is a chemical that is made when your body breaks down substances called purines. If your body has too much uric acid, sharp crystals can form and build up in your joints. This causes pain and swelling. Gout attacks can happen quickly and be very painful (acute gout). Over time, the attacks can affect more joints and happen more often (chronic gout). What are the causes?  Too much uric acid in your blood. This can happen because: ? Your kidneys do not remove enough uric acid from your blood. ? Your body makes too much uric acid. ? You eat too many foods that are high in purines. These foods include organ meats, some seafood, and beer.  Trauma or stress. What increases the risk?  Having a family history of gout.  Being female and middle-aged.  Being female and having gone through menopause.  Being very overweight (obese).  Drinking alcohol, especially beer.  Not having enough water in the body (being dehydrated).  Losing weight too quickly.  Having an organ transplant.  Having lead poisoning.  Taking certain medicines.  Having kidney disease.  Having a skin condition called psoriasis. What are the signs or symptoms? An attack of acute gout usually happens in just one joint. The most common place is the big toe. Attacks often start at night. Other joints that may be affected include joints of the feet, ankle, knee, fingers, wrist, or elbow. Symptoms of an attack may include:  Very bad pain.  Warmth.  Swelling.  Stiffness.  Shiny, red, or purple skin.  Tenderness. The affected joint may be very painful to  touch.  Chills and fever. Chronic gout may cause symptoms more often. More joints may be involved. You may also have white or yellow lumps (tophi) on your hands or feet or in other areas near your joints. How is this treated?  Treatment for this condition has two phases: treating an acute attack and preventing future attacks.  Acute gout treatment may include: ? NSAIDs. ? Steroids. These are taken by mouth or injected into a joint. ? Colchicine. This medicine relieves pain and swelling. It can be given by mouth or through an IV tube.  Preventive treatment may include: ? Taking small doses of NSAIDs or colchicine daily. ? Using a medicine that reduces uric acid levels in your blood. ? Making changes to your diet. You may need to see a food expert (dietitian) about what to eat and drink to prevent gout. Follow these instructions at home: During a gout attack   If told, put ice on the painful area: ? Put ice in a plastic bag. ? Place a towel between your skin and the bag. ? Leave the ice on for 20 minutes, 2-3 times a day.  Raise (elevate) the painful joint above the level of your heart as often as you can.  Rest the joint as much as possible. If the joint is in your leg, you may be given crutches.  Follow instructions from your doctor about what you cannot eat or drink. Avoiding future gout attacks  Eat a low-purine diet. Avoid foods and drinks such as: ? Liver. ? Kidney. ? Anchovies. ? Asparagus. ? Herring. ? Mushrooms. ? Mussels. ? Beer.  Stay at a healthy weight. If you want to lose weight, talk with your doctor. Do not lose weight too fast.  Start or continue an exercise plan as told by your doctor. Eating and drinking  Drink enough fluids to keep your pee (urine) pale yellow.  If you drink alcohol: ? Limit how much you use to:  0-1 drink a day for women.  0-2 drinks a day for men. ? Be aware of how much alcohol is in your drink. In the [place] one drink equals  one 12 oz bottle of beer (355 mL), one 5 oz glass of wine (148 mL), or one 1 oz glass of hard liquor (44 mL). General instructions  Take over-the-counter and prescription medicines only as told by your doctor.  Do not drive or use heavy machinery while taking prescription pain medicine.  Return to your normal activities as told by your doctor. Ask your doctor what activities are safe for you.  Keep all follow-up visits as told by your doctor. This is important. Contact a doctor if:  You have another gout attack.  You still have symptoms of a gout attack after 10 days of treatment.  You have problems (side effects) because of your medicines.  You have chills or a fever.  You have burning pain when you  pee (urinate).  You have pain in your lower back or belly. Get help right away if:  You have very bad pain.  Your pain cannot be controlled.  You cannot pee. Summary  Gout is painful swelling of the joints.  The most common site of pain is the big toe, but it can affect other joints.  Medicines and avoiding some foods can help to prevent and treat gout attacks. This information is not intended to replace advice given to you by your health care provider. Make sure you discuss any questions you have with your health care provider. Document Released: 01/08/2008 Document Revised: 10/21/2017 Document Reviewed: 10/21/2017 Elsevier Interactive Patient Education  2019 Elsevier Inc.  IMPORTANT INFORMATION: PAY CLOSE ATTENTION   PHYSICIAN DISCHARGE INSTRUCTIONS  Follow with Primary care provider  [name], MD  and other consultants as instructed your Hospitalist Physician  [place] IF SYMPTOMS COME BACK, WORSEN OR NEW PROBLEM DEVELOPS.   Please note: You were cared for by a hospitalist during your hospital stay. Every effort will be made to forward records to your primary care provider.  You can request that your primary care provider  send for your hospital records if they have not received them.  Once you are discharged, your primary care physician will handle any further medical issues. Please note that NO REFILLS for any discharge medications will be authorized once you are discharged, as it is imperative that you return to your primary care physician (or establish a relationship with a primary care physician if you do not have one) for your post hospital discharge needs so that they can reassess your need for medications and monitor your lab values.  Please get a complete blood count and chemistry panel checked by your Primary MD at your next visit, and again as instructed by your Primary MD.  Get Medicines reviewed and adjusted: Please take all your medications with you for your next visit with your Primary MD  Laboratory/radiological data: Please request your Primary MD to go over all hospital tests and procedure/radiological results at the follow up, please ask your primary care provider to get all Hospital records sent to his/her office.  In some cases, they will be blood work, cultures and biopsy results pending at the time of your discharge. Please request that your primary care provider follow up on these results.  If you are diabetic, please bring your blood sugar readings with you to your follow up appointment with primary care.    Please call and make your follow up appointments as soon as possible.    Also Note the following: If you experience worsening of your admission symptoms, develop shortness of breath, life threatening emergency, suicidal or homicidal thoughts you must seek medical attention immediately by calling 911 or calling your MD immediately  if symptoms less severe.  You must read complete instructions/literature along with all the possible adverse reactions/side effects for all the Medicines you take and that have been prescribed to you. Take any new Medicines after you have completely understood  and accpet all the possible adverse reactions/side effects.   Do not drive when taking Pain medications or sleeping medications (Benzodiazepines)  Do not take more than prescribed Pain, Sleep and Anxiety Medications. It is not advisable to combine anxiety,sleep and pain medications without talking with your primary care practitioner  Special Instructions: If you have smoked or chewed Tobacco  in the last 2 yrs please stop smoking, stop any regular  Alcohol  and or any Recreational drug use.  Wear Seat belts while driving.

## 2018-08-12 LAB — URIC ACID, RANDOM URINE: Uric Acid, Urine: 15.1 mg/dL

## 2018-08-16 ENCOUNTER — Encounter (HOSPITAL_COMMUNITY): Payer: Self-pay

## 2018-08-16 ENCOUNTER — Other Ambulatory Visit: Payer: Self-pay

## 2018-08-16 ENCOUNTER — Observation Stay (HOSPITAL_COMMUNITY)
Admission: EM | Admit: 2018-08-16 | Discharge: 2018-08-17 | Disposition: A | Discharge status: Home / Self Care | Payer: Medicaid Other | Attending: Family Medicine | Admitting: Family Medicine

## 2018-08-16 DIAGNOSIS — Z91040 Latex allergy status: Secondary | ICD-10-CM | POA: Diagnosis not present

## 2018-08-16 DIAGNOSIS — R001 Bradycardia, unspecified: Principal | ICD-10-CM | POA: Insufficient documentation

## 2018-08-16 DIAGNOSIS — Z79899 Other long term (current) drug therapy: Secondary | ICD-10-CM | POA: Diagnosis not present

## 2018-08-16 DIAGNOSIS — I129 Hypertensive chronic kidney disease with stage 1 through stage 4 chronic kidney disease, or unspecified chronic kidney disease: Secondary | ICD-10-CM | POA: Diagnosis not present

## 2018-08-16 DIAGNOSIS — N184 Chronic kidney disease, stage 4 (severe): Secondary | ICD-10-CM | POA: Diagnosis not present

## 2018-08-16 DIAGNOSIS — Z03818 Encounter for observation for suspected exposure to other biological agents ruled out: Secondary | ICD-10-CM | POA: Insufficient documentation

## 2018-08-16 DIAGNOSIS — E119 Type 2 diabetes mellitus without complications: Secondary | ICD-10-CM | POA: Insufficient documentation

## 2018-08-16 DIAGNOSIS — I10 Essential (primary) hypertension: Secondary | ICD-10-CM

## 2018-08-16 LAB — CBC WITH DIFFERENTIAL/PLATELET
Abs Immature Granulocytes: 0.19 10*3/uL — ABNORMAL HIGH (ref 0.00–0.07)
Basophils Absolute: 0 10*3/uL (ref 0.0–0.1)
Basophils Relative: 0 %
Eosinophils Absolute: 0 10*3/uL (ref 0.0–0.5)
Eosinophils Relative: 0 %
HCT: 37.5 % (ref 36.0–46.0)
Hemoglobin: 11.3 g/dL — ABNORMAL LOW (ref 12.0–15.0)
Immature Granulocytes: 1 %
Lymphocytes Relative: 10 %
Lymphs Abs: 1.8 10*3/uL (ref 0.7–4.0)
MCH: 23.1 pg — ABNORMAL LOW (ref 26.0–34.0)
MCHC: 30.1 g/dL (ref 30.0–36.0)
MCV: 76.7 fL — ABNORMAL LOW (ref 80.0–100.0)
Monocytes Absolute: 0.3 10*3/uL (ref 0.1–1.0)
Monocytes Relative: 2 %
Neutro Abs: 15.3 10*3/uL — ABNORMAL HIGH (ref 1.7–7.7)
Neutrophils Relative %: 87 %
Platelets: 483 10*3/uL — ABNORMAL HIGH (ref 150–400)
RBC: 4.89 MIL/uL (ref 3.87–5.11)
RDW: 17.3 % — ABNORMAL HIGH (ref 11.5–15.5)
WBC: 17.6 10*3/uL — ABNORMAL HIGH (ref 4.0–10.5)
nRBC: 0 % (ref 0.0–0.2)

## 2018-08-16 LAB — URINALYSIS, ROUTINE W REFLEX MICROSCOPIC
Bilirubin Urine: NEGATIVE
Glucose, UA: NEGATIVE mg/dL
Hgb urine dipstick: NEGATIVE
Ketones, ur: NEGATIVE mg/dL
Leukocytes,Ua: NEGATIVE
Nitrite: NEGATIVE
Protein, ur: NEGATIVE mg/dL
Specific Gravity, Urine: 1.011 (ref 1.005–1.030)
pH: 5 (ref 5.0–8.0)

## 2018-08-16 LAB — COMPREHENSIVE METABOLIC PANEL
ALT: 49 U/L — ABNORMAL HIGH (ref 0–44)
AST: 22 U/L (ref 15–41)
Albumin: 3.6 g/dL (ref 3.5–5.0)
Alkaline Phosphatase: 88 U/L (ref 38–126)
Anion gap: 11 (ref 5–15)
BUN: 67 mg/dL — ABNORMAL HIGH (ref 6–20)
CO2: 22 mmol/L (ref 22–32)
Calcium: 8.9 mg/dL (ref 8.9–10.3)
Chloride: 109 mmol/L (ref 98–111)
Creatinine, Ser: 3.29 mg/dL — ABNORMAL HIGH (ref 0.44–1.00)
GFR calc Af Amer: 17 mL/min — ABNORMAL LOW (ref >60)
GFR calc non Af Amer: 15 mL/min — ABNORMAL LOW (ref >60)
Glucose, Bld: 166 mg/dL — ABNORMAL HIGH (ref 70–99)
Potassium: 4.1 mmol/L (ref 3.5–5.1)
Sodium: 142 mmol/L (ref 135–145)
Total Bilirubin: 0.3 mg/dL (ref 0.3–1.2)
Total Protein: 7.9 g/dL (ref 6.5–8.1)

## 2018-08-16 LAB — SARS CORONAVIRUS 2 BY RT PCR (HOSPITAL ORDER, PERFORMED IN ~~LOC~~ HOSPITAL LAB): SARS Coronavirus 2: NEGATIVE

## 2018-08-16 LAB — TROPONIN I
Troponin I: <0.03 ng/mL (ref <0.03)
Troponin I: <0.03 ng/mL (ref <0.03)

## 2018-08-16 LAB — TSH: TSH: 0.682 u[IU]/mL (ref 0.350–4.500)

## 2018-08-16 LAB — GLUCOSE, CAPILLARY: Glucose-Capillary: 177 mg/dL — ABNORMAL HIGH (ref 70–99)

## 2018-08-16 LAB — MAGNESIUM: Magnesium: 2.3 mg/dL (ref 1.7–2.4)

## 2018-08-16 MED ORDER — INSULIN ASPART 100 UNIT/ML ~~LOC~~ SOLN
0.0000 [IU] | Freq: Three times a day (TID) | SUBCUTANEOUS | Status: DC
  Administered 2018-08-17 (×2): 1 [IU] via SUBCUTANEOUS

## 2018-08-16 MED ORDER — ACETAMINOPHEN 650 MG RE SUPP: 650.0000 mg | Freq: Four times a day (QID) | RECTAL | Status: DC | PRN

## 2018-08-16 MED ORDER — POLYETHYLENE GLYCOL 3350 17 G PO PACK: 17.0000 g | PACK | Freq: Every day | ORAL | Status: DC | PRN

## 2018-08-16 MED ORDER — SODIUM CHLORIDE 0.9 % IV SOLN
INTRAVENOUS | Status: DC
  Administered 2018-08-16: 13:00:00 via INTRAVENOUS

## 2018-08-16 MED ORDER — ACETAMINOPHEN 325 MG PO TABS: 650.0000 mg | ORAL_TABLET | Freq: Four times a day (QID) | ORAL | Status: DC | PRN

## 2018-08-16 MED ORDER — ATORVASTATIN CALCIUM 40 MG PO TABS
40.0000 mg | ORAL_TABLET | Freq: Every day | ORAL | Status: DC
  Administered 2018-08-16: 40 mg via ORAL
  Filled 2018-08-16: qty 1

## 2018-08-16 MED ORDER — HEPARIN SODIUM (PORCINE) 5000 UNIT/ML IJ SOLN
5000.0000 [IU] | Freq: Three times a day (TID) | INTRAMUSCULAR | Status: DC
  Administered 2018-08-16 – 2018-08-17 (×2): 5000 [IU] via SUBCUTANEOUS
  Filled 2018-08-16 (×3): qty 1

## 2018-08-16 MED ORDER — COLCHICINE 0.6 MG PO TABS
0.3000 mg | ORAL_TABLET | Freq: Every day | ORAL | Status: DC
  Administered 2018-08-17: 11:00:00 0.3 mg via ORAL
  Filled 2018-08-16: qty 0.5
  Filled 2018-08-16: qty 1
  Filled 2018-08-16: qty 0.5

## 2018-08-16 MED ORDER — PROMETHAZINE HCL 12.5 MG PO TABS: 12.5000 mg | ORAL_TABLET | Freq: Four times a day (QID) | ORAL | Status: DC | PRN

## 2018-08-16 NOTE — ED Provider Notes (Signed)
[facility] EMERGENCY DEPARTMENT Provider Note   CSN: [id number] Arrival date & time: 08/16/18  1236    History   Chief Complaint Chief Complaint  Patient presents with  . Bradycardia    HPI Margaret Aguilar is a 58 y.o. female.     HPI   She presents for evaluation of low heartbeat.  She was at home today and a physical therapist checked her vital signs and noticed that her heart rate was low.  Based on this, her doctor recommended that she come to the ED for evaluation.  Patient feels a little bit "groggy," for several days, since hospital discharge.  She had been admitted last week for gout and AKI.  She was diuresed.  She was taken off hydrochlorothiazide, blood pain and benazepril.  She has been regular dose metoprolol for several months.  She denies fever, chills, chest pain, cough, shortness of breath, nausea, vomiting, weakness or dizziness.  There are no other known modifying factors.   Past Medical History:  Diagnosis Date  . Arthritis   . Diabetes mellitus without complication ([place])    x 5 yrs  . Herpes   . Hypertension   . Kidney disease    Stage 4  . 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 Gout attack 08/09/2018  . Acute on chronic renal failure ([place]) 08/08/2018  . CKD (chronic kidney disease) stage 4, GFR 15-29 ml/min (HCC) 01/09/2017  . Spondylosis of lumbosacral region 10/09/2016  . Type 2 diabetes mellitus, without long-term current use of insulin ([place]) 09/18/2016  . IDA (iron deficiency anemia) 12/14/2013  . Guaiac + stool 12/14/2013  . Hypertension 09/28/2013  . Diabetes ([place]) 09/28/2013  . Renal insufficiency 09/28/2013  . Morbid obesity ([place]) 09/28/2013  . Obstructive sleep apnea on CPAP 06/05/2011  . Hyperlipidemia 06/05/2011  . Gastroesophageal reflux disease 06/05/2011  . Arthritis 06/05/2011    Past Surgical History:  Procedure Laterality Date  . CESAREAN  SECTION    . COLONOSCOPY N/A 01/12/2014   Procedure: COLONOSCOPY;  Surgeon: Rogene Houston, MD;  Location: [facility] ENDO SUITE;  Service: Endoscopy;  Laterality: N/A;  225  . TUBAL LIGATION       OB History   No obstetric history on file.      Home Medications    Prior to Admission medications   Medication Sig Start Date End Date Taking? Authorizing Provider  acetaminophen (TYLENOL) 500 MG tablet Take 2 tablets (1,000 mg total) by mouth every 6 (six) hours as needed. 08/11/18  Yes Johnson, Clanford L, MD  albuterol (PROVENTIL HFA;VENTOLIN HFA) 108 (90 Base) MCG/ACT inhaler Inhale 2 puffs into the lungs every 6 (six) hours as needed for wheezing or shortness of breath. 01/12/18  Yes Fulp, Cammie, MD  Blood Glucose Monitoring Suppl (TRUE METRIX AIR GLUCOSE METER) w/Device KIT 1 Device by Does not apply route 2 (two) times daily at 8 am and 10 pm. 01/12/18  Yes Fulp, Cammie, MD  colchicine 0.6 MG tablet Take 0.5 tablets (0.3 mg total) by mouth daily for 30 days. 08/12/18 09/11/18 Yes Johnson, Clanford L, MD  glucose blood (TRUE METRIX BLOOD GLUCOSE TEST) test strip Use as instructed to check blood sugars twice per day 01/12/18  Yes Fulp, Cammie, MD  TRUEPLUS LANCETS 28G MISC Use to check blood sugar twice per day 01/12/18  Yes Fulp, Cammie, MD  allopurinol (ZYLOPRIM) 100 MG tablet Take 1  tablet (100 mg total) by mouth daily for 30 days. Patient not taking: Reported on 08/16/2018 08/12/18 09/11/18  [name], MD  metoprolol succinate (TOPROL-XL) 100 MG 24 hr tablet Take 1 tablet (100 mg total) by mouth daily. Take with or immediately following a meal. Patient not taking: Reported on 08/16/2018 06/23/18   Lily Kocher, PA-C  predniSONE (DELTASONE) 20 MG tablet Take 2 tablets (40 mg total) by mouth daily with breakfast for 5 days. Patient not taking: Reported on 08/16/2018 08/12/18 08/17/18  [name], MD  traMADol (ULTRAM) 50 MG tablet Take 1 tablet (50 mg total) by mouth every 6 (six) hours as  needed. Patient not taking: Reported on 08/16/2018 06/23/18   Lily Kocher, PA-C    Family History Family History  Problem Relation Age of Onset  . Diabetes Mother   . Hyperlipidemia Mother   . Hyperlipidemia Father   . Hypertension Father   . Stroke Brother   . Diabetes Brother     Social History Social History   Tobacco Use  . Smoking status: Never Smoker  . Smokeless tobacco: Never Used  Substance Use Topics  . Alcohol use: No  . Drug use: No     Allergies   Nutritional supplements and Latex   Review of Systems Review of Systems  All other systems reviewed and are negative.    Physical Exam Updated Vital Signs BP 140/67   Pulse (!) 38   Temp 98.4 F (36.9 C) (Oral)   Resp 15   Ht '5\' 2"'  (1.575 m)   Wt 112 kg   SpO2 99%   BMI 45.16 kg/m   Physical Exam Vitals signs and nursing note reviewed.  Constitutional:      General: She is not in acute distress.    Appearance: She is well-developed. She is obese. She is not ill-appearing, toxic-appearing or diaphoretic.  HENT:     Head: Normocephalic and atraumatic.     Right Ear: External ear normal.     Left Ear: External ear normal.  Eyes:     Conjunctiva/sclera: Conjunctivae normal.     Pupils: Pupils are equal, round, and reactive to light.  Neck:     Musculoskeletal: Normal range of motion and neck supple.     Trachea: Phonation normal.  Cardiovascular:     Rate and Rhythm: Regular rhythm. Bradycardia present.     Heart sounds: Normal heart sounds. No murmur.  Pulmonary:     Effort: Pulmonary effort is normal. No respiratory distress.     Breath sounds: Normal breath sounds. No stridor.  Abdominal:     General: There is no distension.     Palpations: Abdomen is soft.     Tenderness: There is no abdominal tenderness.  Musculoskeletal: Normal range of motion.  Skin:    General: Skin is warm and dry.  Neurological:     Mental Status: She is alert and oriented to person, place, and time.      Cranial Nerves: No cranial nerve deficit.     Sensory: No sensory deficit.     Motor: No abnormal muscle tone.     Coordination: Coordination normal.  Psychiatric:        Mood and Affect: Mood normal.        Behavior: Behavior normal.        Thought Content: Thought content normal.        Judgment: Judgment normal.      ED Treatments / Results  Labs (all labs  ordered are listed, but only abnormal results are displayed) Labs Reviewed  COMPREHENSIVE METABOLIC PANEL - Abnormal; Notable for the following components:      Result Value   Glucose, Bld 166 (*)    BUN 67 (*)    Creatinine, Ser 3.29 (*)    ALT 49 (*)    GFR calc non Af Amer 15 (*)    GFR calc Af Amer 17 (*)    All other components within normal limits  CBC WITH DIFFERENTIAL/PLATELET - Abnormal; Notable for the following components:   WBC 17.6 (*)    Hemoglobin 11.3 (*)    MCV 76.7 (*)    MCH 23.1 (*)    RDW 17.3 (*)    Platelets 483 (*)    Neutro Abs 15.3 (*)    Abs Immature Granulocytes 0.19 (*)    All other components within normal limits  URINALYSIS, ROUTINE W REFLEX MICROSCOPIC - Abnormal; Notable for the following components:   Color, Urine STRAW (*)    APPearance HAZY (*)    All other components within normal limits  SARS CORONAVIRUS 2 (HOSPITAL ORDER, [place] [facility])    EKG EKG Interpretation  Date/Time:  Monday Aug 16 2018 12:58:52 EDT Ventricular Rate:  35 PR Interval:    QRS Duration: 117 QT Interval:  564 QTC Calculation: 431 R Axis:   38 Text Interpretation:  Sinus bradycardia Nonspecific intraventricular conduction delay Since last tracing rate slower Confirmed by Daleen Bo [phone]) on 08/16/2018 3:37:46 PM   Radiology No results found.  Procedures Procedures (including critical care time)  Medications Ordered in ED Medications  0.9 %  sodium chloride infusion ( Intravenous New Bag/Given 08/16/18 1315)     Initial Impression / Assessment and Plan / ED  Course  I have reviewed the triage vital signs and the nursing notes.  Pertinent labs & imaging results that were available during my care of the patient were reviewed by me and considered in my medical decision making (see chart for details).  Clinical Course as of Aug 15 1553  Mon Aug 16, 2018  1536 Normal except glucose high, BUN high, creatinine high, ALT high, GFR low  Comprehensive metabolic panel(!) [EW]  6045 Normal  Urinalysis, Routine w reflex microscopic(!) [EW]  1536 Normal except white count high, hemoglobin low, MCV low  CBC with Differential(!) [EW]  1549 I discussed the case with cardiologist, Dr. Harrington Challenger who will evaluate the patient as a Optometrist.   [EW]    Clinical Course User Index [EW] Daleen Bo, MD        Patient Vitals for the past 24 hrs:  BP Temp Temp src Pulse Resp SpO2 Height Weight  08/16/18 1530 140/67 - - (!) 38 15 99 % - -  08/16/18 1500 (!) 162/96 - - (!) 37 16 96 % - -  08/16/18 1430 (!) 164/72 - - (!) 35 16 100 % - -  08/16/18 1346 (!) 165/77 - - (!) 35 18 100 % - -  08/16/18 1308 (!) 165/77 - - (!) 33 14 98 % - -  08/16/18 1254 (!) 189/106 98.4 F (36.9 C) Oral (!) 35 10 99 % - -  08/16/18 1252 - - - - - 100 % '5\' 2"'  (1.575 m) 112 kg     3:21 PM Reevaluation with update and discussion. After initial assessment and treatment, an updated evaluation reveals no change in status.  Findings discussed with the patient and all questions were answered. Vira Agar  Jessel Gettinger   Medical Decision Making: Bradycardia, likely related to use of beta-blocker.  Mild hypertension at this time.  Patient was recently taken off hydrochlorothiazide, benazepril, and amlodipine.  Blood pressure mildly elevated, improved spontaneously.  Chronic renal insufficiency remains present, and is stable.  Mild hyperglycemia today, nonspecific, nonfasting sample.  We will continue to monitor.  She will require hospitalization, with cardiac consultation.  CRITICAL CARE-yes Performed  by: Daleen Bo  Nursing Notes Reviewed/ Care Coordinated Applicable Imaging Reviewed Interpretation of Laboratory Data incorporated into ED treatment  3:41 PM-Consult complete with hospitalist. Patient case explained and discussed.  Hospitalist agrees to admit patient for further evaluation and treatment. Call ended at 3:55 PM  Plan:  Admit  Final Clinical Impressions(s) / ED Diagnoses   Final diagnoses:  Bradycardia    ED Discharge Orders    None       Daleen Bo, MD 08/16/18 1556

## 2018-08-16 NOTE — Plan of Care (Signed)
  Problem: Skin Integrity: Goal: Risk for impaired skin integrity will decrease Outcome: Progressing   Problem: Pain Managment: Goal: General experience of comfort will improve Outcome: Progressing   Problem: Activity: Goal: Risk for activity intolerance will decrease Outcome: Progressing   Problem: Safety: Goal: Ability to remain free from injury will improve Outcome: Progressing

## 2018-08-16 NOTE — ED Notes (Signed)
Report given to RN on 300 

## 2018-08-16 NOTE — ED Notes (Signed)
Dr. Eulis Foster made aware of HR in the 30's

## 2018-08-16 NOTE — Consult Note (Signed)
Cardiology Consultation:   Patient ID: Margaret Aguilar MRN: [id number]; DOB: Jul 20, 1960  Admit date: 08/16/2018 Date of Consult: 08/16/2018  Primary Care Provider: [name], MD Primary Cardiologist: Margaret Aguilar      Patient Profile:   Margaret Aguilar is a 58 y.o. female with a hx of HTN who is being seen today for the evaluation of bradycardia  at the request of Dr Margaret Aguilar    History of Present Illness:   Margaret Aguilar is a 58 yo with hx of CKD (stage 4), HTN, DM, obesity who presents to ED today with bradycardia    The patient was recently admitted to [facility] at the end of April with gouty flare and worsening renal function.   She was discharged home on 4/29   HTN meds adjusted and she was discharged on same dose of metoprolol that she came in on.  She is normally followed by Dr Margaret Aguilar    After discharge she had one spell when she got up to go to the bathroom (right after d/c ) where she felt a little light headed   No syncopal spells though  Today physical therapy came by   Noticed hyer HR was low  Primary MD called   Told to come to ED  She says her breathign is OK  No dizziness   No CP      Past Medical History:  Diagnosis Date  . Arthritis   . Diabetes mellitus without complication ([place])    x 5 yrs  . Herpes   . Hypertension   . Kidney disease    Stage 4  . 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 CESAREAN SECTION    . COLONOSCOPY N/A 01/12/2014   Procedure: COLONOSCOPY;  Surgeon: Margaret Houston, MD;  Location: [facility];  Service: Endoscopy;  Laterality: N/A;  225  . TUBAL LIGATION      Home Medications                      Prior to Admission medications   Medication Sig Start Date End Date Taking? Authorizing Provider  acetaminophen (TYLENOL) 500 MG tablet Take 2 tablets (1,000 mg total) by mouth every 6 (six) hours as needed. 08/11/18  Yes Aguilar, Margaret L, MD  albuterol  (PROVENTIL HFA;VENTOLIN HFA) 108 (90 Base) MCG/ACT inhaler Inhale 2 puffs into the lungs every 6 (six) hours as needed for wheezing or shortness of breath. 01/12/18  Yes Aguilar, Cammie, MD  Blood Glucose Monitoring Suppl (TRUE METRIX AIR GLUCOSE METER) w/Device KIT 1 Device by Does not apply route 2 (two) times daily at 8 am and 10 pm. 01/12/18  Yes Aguilar, Cammie, MD  colchicine 0.6 MG tablet Take 0.5 tablets (0.3 mg total) by mouth daily for 30 days. 08/12/18 09/11/18 Yes Aguilar, Margaret L, MD  glucose blood (TRUE METRIX BLOOD GLUCOSE TEST) test strip Use as instructed to check blood sugars twice per day 01/12/18  Yes Aguilar, Cammie, MD  TRUEPLUS LANCETS 28G MISC Use to check blood sugar twice per day 01/12/18  Yes Aguilar, Cammie, MD  allopurinol (ZYLOPRIM) 100 MG tablet Take 1 tablet (100 mg total) by mouth daily for 30 days. Patient not taking: Reported on 08/16/2018 08/12/18 09/11/18  Margaret Iba, MD  metoprolol succinate (TOPROL-XL) 100 MG 24 hr tablet Take 1 tablet (100 mg total) by mouth daily. Take with or immediately following a meal.  Patient not taking: Reported on 08/16/2018 06/23/18   Margaret Kocher, PA-C  predniSONE (DELTASONE) 20 MG tablet Take 2 tablets (40 mg total) by mouth daily with breakfast for 5 days. Patient not taking: Reported on 08/16/2018 08/12/18 08/17/18  Margaret Iba, MD  traMADol (ULTRAM) 50 MG tablet Take 1 tablet (50 mg total) by mouth every 6 (six) hours as needed. Patient not taking: Reported on 08/16/2018 06/23/18   Margaret Kocher, PA-C      Inpatient Medications: Scheduled Meds:  Continuous Infusions: . sodium chloride 125 mL/hr at 08/16/18 1315   PRN Meds:   Allergies:    Allergies  Allergen Reactions  . Nutritional Supplements     walnut  . Latex     Social History:   Social History   Socioeconomic History  . Marital status: Married    Spouse name: Not on file  . Number of children: Not on file  . Years of education: Not on file  .  Highest education level: Not on file  Occupational History  . Not on file  Social Needs  . Financial resource strain: Not hard at all  . Food insecurity:    Worry: Never true    Inability: Never true  . Transportation needs:    Medical: No    Non-medical: No  Tobacco Use  . Smoking status: Never Smoker  . Smokeless tobacco: Never Used  Substance and Sexual Activity  . Alcohol use: No  . Drug use: No  . Sexual activity: Not Currently  Lifestyle  . Physical activity:    Days per week: Not on file    Minutes per session: Not on file  . Stress: Not on file  Relationships  . Social connections:    Talks on phone: Not on file    Gets together: Not on file    Attends religious service: Not on file    Active member of club or organization: Not on file    Attends meetings of clubs or organizations: Not on file    Relationship status: Not on file  . Intimate partner violence:    Fear of current or ex partner: Not on file    Emotionally abused: Not on file    Physically abused: Not on file    Forced sexual activity: Not on file  Other Topics Concern  . Not on file  Social History Narrative  . Not on file    Family History:    Family History  Problem Relation Age of Onset  . Diabetes Mother   . Hyperlipidemia Mother   . Hyperlipidemia Father   . Hypertension Father   . Stroke Brother   . Diabetes Brother      ROS:  Please see the history of present illness.  All other ROS reviewed and negative.     Physical Exam/Data:   Vitals:   08/16/18 1346 08/16/18 1430 08/16/18 1500 08/16/18 1530  BP: (!) 165/77 (!) 164/72 (!) 162/96 140/67  Pulse: (!) 35 (!) 35 (!) 37 (!) 38  Resp: '18 16 16 15  ' Temp:      TempSrc:      SpO2: 100% 100% 96% 99%  Weight:      Height:       No intake or output data in the 24 hours ending 08/16/18 1601 Last 3 Weights 08/16/2018 08/08/2018 06/23/2018  Weight (lbs) 246 lb 14.6 oz 249 lb 246 lb  Weight (kg) 112 kg 112.946 kg 111.585 kg  Body mass index is 45.16 kg/m.  General:  Well nourished, well developed, in no acute distress HEENT: normal Lymph: no adenopathy Neck: no JVD  Neck is full    Vascular: No carotid bruits; FA pulses 2+ bilaterally without bruits  Cardiac:  normal S1, S2; RRR; no murmur  Lungs:  clear to auscultation bilaterally, no wheezing, rhonchi or rales  Abd: soft, nontender, no hepatomegaly  Ext: Tr edema Musculoskeletal:  No deformities, BUE and BLEal Skin: warm and dry  Neuro:  CNs 2-12 intact, no focal abnormalities noted Psych:  Normal affect   EKG:  The EKG was personally reviewed and demonstrates:  Sinus bradycardia 35 bpm   Telemetry:  Telemetry was personally reviewed and demonstrates:  Sinus bradycardia   30s to 40s    Relevant CV Studies:   Laboratory Data:  Chemistry Recent Labs  Lab 08/10/18 0853 08/11/18 0452 08/16/18 1307  NA 140 140 142  K 3.7 4.0 4.1  CL 108 109 109  CO2 22 21* 22  GLUCOSE 111* 165* 166*  BUN 87* 89* 67*  CREATININE 3.98* 4.00* 3.29*  CALCIUM 9.3 9.2 8.9  GFRNONAA 12* 12* 15*  GFRAA 14* 14* 17*  ANIONGAP '10 10 11    ' Recent Labs  Lab 08/16/18 1307  PROT 7.9  ALBUMIN 3.6  AST 22  ALT 49*  ALKPHOS 88  BILITOT 0.3   Hematology Recent Labs  Lab 08/16/18 1307  WBC 17.6*  RBC 4.89  HGB 11.3*  HCT 37.5  MCV 76.7*  MCH 23.1*  MCHC 30.1  RDW 17.3*  PLT 483*   Cardiac EnzymesNo results for input(s): TROPONINI in the last 168 hours. No results for input(s): TROPIPOC in the last 168 hours.  BNPNo results for input(s): BNP, PROBNP in the last 168 hours.  DDimer No results for input(s): DDIMER in the last 168 hours.  Radiology/Studies:  No results found.  Assessment and Plan:   Bradycardia Patient presents with bradycardia on Toprolx XL  Does not appear to be hemodynamically destabilizing as BP remains good   Interesting, her HR was better just a few days ago in hospital   ? If chance of medicine confusion with increase dose given.   Recomm:   Hold toprol   Follow on Tele Would switch to another agent for BP Check TSH  Would get echo   Can be done as outpt as well  Not urgent    I would stop IV fluids     2   HTN   Pt has signirficant HTN    BP is good despite low HR  Off of ACEI now and HCTZ Would prob add back amlodipine for BP control     3  CKD   Pt just admitted with gouty flare   Meds changed  Renal function will need to be followed closely   Pt plans to follow in [place] with [facility] Stop IV fluids      4  Increased WBC  On prednisone   May explain     5  DM   Will need t obe followed  6  HL   Check lipids    Pt should be on a statin with diabetic hx  LDL 130 in October 2019   Would start Lipitor 40    7  Gout  Recent flare   Pt follows with Dr Margaret Aguilar for cardiology in [place]   He will follow after    For questions or updates, please contact [facility] Please consult [email or web address] for contact info under     Signed, Dorris Carnes, MD  08/16/2018 4:01 PM

## 2018-08-16 NOTE — H&P (Addendum)
History and Physical    Margaret Aguilar BHA:[id number] DOB: [birth year] DOA: 08/16/2018  PCP: [name], MD   Patient coming from: Home  I have personally briefly reviewed patient's old medical records in [place]  Chief Complaint: Slow heart rate  HPI: Margaret Aguilar is a 58 y.o. female with medical history significant for CKD 4, hypertension, gout, diabetes mellitus, who presented to the ED with reports of slow heart rate.  Patient was about to work with physical therapy today when her vitals were checked, heart rate was in the 30s.  Patient's primary care provider was called and patient was to go to the emergency department, and not to take her metoprolol unfortunately patient had already just taking her metoprolol.  Patient denies taking more than prescribed dose 100 mg once daily. Patient reports some dizziness/grogginess when standing/ambulating.  She otherwise denies difficulty breathing, no chest pain, no generalized weakness.  She reports initial constipation and then daily bowel movement since discharge, no vomiting, no burning with urination, no fevers no chills, no sore throat no, no known contact with sick/COVID positive patients.   Recent hospitalization 4/26 to 4/29 for gouty arthritis involving her left ankle and pre-renal acute kidney injury.  Patient was hydrated, and treated with steroids.  Home antihypertensives Norvasc, HCTZ, metoprolol were held on admission for soft blood pressure and dehydration.  Patient's home metoprolol was resumed on discharge.  Patient's left foot has done well since discharge.   ED Course: heart rate 33- 38, with blood pressure 140s- 180s, sats 100% on room air.  Cr improved at 3.29, compared to recent discharge Cr. WBC elevated at 17.6. Clean UA.  SARS Covid checked in the ED negative.  EKG- sinus bradycardia with rate 35. Cardiology consulted in ED, hospitalist to admit for sinus bradycardia likely induced by her chronic BB therapy.   Review  of Systems: As per HPI all other systems reviewed and negative.  Past Medical History:  Diagnosis Date  . Arthritis   . Diabetes mellitus without complication ([place])    x 5 yrs  . Herpes   . Hypertension   . Kidney disease    Stage 4  . 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 CESAREAN SECTION    . COLONOSCOPY N/A 01/12/2014   Procedure: COLONOSCOPY;  Surgeon: Rogene Houston, MD;  Location[facility];  Service: Endoscopy;  Laterality: N/A;  225  . TUBAL LIGATION       reports that she has never smoked. She has never used smokeless tobacco. She reports that she does not drink alcohol or use drugs.  Allergies  Allergen Reactions  . Nutritional Supplements     walnut  . Latex     Family History  Problem Relation Age of Onset  . Diabetes Mother   . Hyperlipidemia Mother   . Hyperlipidemia Father   . Hypertension Father   . Stroke Brother   . Diabetes Brother     Prior to Admission medications   Medication Sig Start Date End Date Taking? Authorizing Provider  acetaminophen (TYLENOL) 500 MG tablet Take 2 tablets (1,000 mg total) by mouth every 6 (six) hours as needed. 08/11/18  Yes Johnson, Clanford L, MD  albuterol (PROVENTIL HFA;VENTOLIN HFA) 108 (90 Base) MCG/ACT inhaler Inhale 2 puffs into the lungs every 6 (six) hours as needed for wheezing or shortness of breath. 01/12/18  Yes Antony Blackbird, MD  Blood Glucose Monitoring Suppl (TRUE METRIX AIR GLUCOSE METER) w/Device KIT 1 Device by Does not apply route 2 (two) times daily at 8 am and 10 pm. 01/12/18  Yes Fulp, Cammie, MD  colchicine 0.6 MG tablet Take 0.5 tablets (0.3 mg total) by mouth daily for 30 days. 08/12/18 09/11/18 Yes Johnson, Clanford L, MD  glucose blood (TRUE METRIX BLOOD GLUCOSE TEST) test strip Use as instructed to check blood sugars twice per day 01/12/18  Yes Fulp, Cammie, MD  TRUEPLUS LANCETS 28G MISC Use to check blood  sugar twice per day 01/12/18  Yes Fulp, Cammie, MD  allopurinol (ZYLOPRIM) 100 MG tablet Take 1 tablet (100 mg total) by mouth daily for 30 days. Patient not taking: Reported on 08/16/2018 08/12/18 09/11/18  [name], MD  metoprolol succinate (TOPROL-XL) 100 MG 24 hr tablet Take 1 tablet (100 mg total) by mouth daily. Take with or immediately following a meal. Patient not taking: Reported on 08/16/2018 06/23/18   Lily Kocher, PA-C  predniSONE (DELTASONE) 20 MG tablet Take 2 tablets (40 mg total) by mouth daily with breakfast for 5 days. Patient not taking: Reported on 08/16/2018 08/12/18 08/17/18  [name], MD  traMADol (ULTRAM) 50 MG tablet Take 1 tablet (50 mg total) by mouth every 6 (six) hours as needed. Patient not taking: Reported on 08/16/2018 06/23/18   Lily Kocher, PA-C    Physical Exam: Vitals:   08/16/18 1346 08/16/18 1430 08/16/18 1500 08/16/18 1530  BP: (!) 165/77 (!) 164/72 (!) 162/96 140/67  Pulse: (!) 35 (!) 35 (!) 37 (!) 38  Resp: _0 Temp:      TempSrc:      SpO2: 100% 100% 96% 99%  Weight:      Height:        Constitutional: NAD, calm, comfortable Vitals:   08/16/18 1346 08/16/18 1430 08/16/18 1500 08/16/18 1530  BP: (!) 165/77 (!) 164/72 (!) 162/96 140/67  Pulse: (!) 35 (!) 35 (!) 37 (!) 38  Resp: _1 Temp:      TempSrc:      SpO2: 100% 100% 96% 99%  Weight:      Height:       Eyes: PERRL, lids and conjunctivae normal ENMT: Mucous membranes are moist. Posterior pharynx clear of any exudate or lesions.  Neck: normal, supple, no masses, no thyromegaly Respiratory: clear to auscultation bilaterally, no wheezing, no crackles. Normal respiratory effort. No accessory muscle use.  Cardiovascular: Bradycardic but regular heart rate, no murmurs / rubs / gallops. No extremity edema. 2+ pedal pulses.  Abdomen: no tenderness, no masses palpated. No hepatosplenomegaly. Bowel sounds positive.  Musculoskeletal: no clubbing / cyanosis. No  joint deformity upper and lower extremities. Good ROM, no contractures. Normal muscle tone.  Skin: no rashes, lesions, ulcers. No induration Neurologic: CN 2-12 grossly intact. Sensation intact, DTR normal. Strength 5/5 in all 4.  Psychiatric: Normal judgment and insight. Alert and oriented x 3. Normal mood.   Labs on Admission: I have personally reviewed following labs and imaging studies  CBC: Recent Labs  Lab 08/16/18 1307  WBC 17.6*  NEUTROABS 15.3*  HGB 11.3*  HCT 37.5  MCV 76.7*  PLT 160*   Basic Metabolic Panel: Recent Labs  Lab 08/10/18 0853 08/11/18 0452 08/16/18 1307  NA 140 140 142  K 3.7 4.0 4.1  CL 108 109 109  CO2 22 21* 22  GLUCOSE 111* 165* 166*  BUN 87* 89* 67*  CREATININE  3.98* 4.00* 3.29*  CALCIUM 9.3 9.2 8.9   Liver Function Tests: Recent Labs  Lab 08/16/18 1307  AST 22  ALT 49*  ALKPHOS 88  BILITOT 0.3  PROT 7.9  ALBUMIN 3.6   CBG: Recent Labs  Lab 08/10/18 1137 08/10/18 1600 08/10/18 2114 08/11/18 0740 08/11/18 1129  GLUCAP 180* 161* 146* 111* 152*   Urine analysis:    Component Value Date/Time   COLORURINE STRAW (A) 08/16/2018 1307   APPEARANCEUR HAZY (A) 08/16/2018 1307   LABSPEC 1.011 08/16/2018 1307   PHURINE 5.0 08/16/2018 [place] 08/16/2018 1307   [place] 08/16/2018 1307   BILIRUBINUR NEGATIVE 08/16/2018 1307   KETONESUR NEGATIVE 08/16/2018 1307   PROTEINUR NEGATIVE 08/16/2018 1307   NITRITE NEGATIVE 08/16/2018 1307   LEUKOCYTESUR NEGATIVE 08/16/2018 1307    Radiological Exams on Admission: No results found.  EKG: Independently reviewed.  Sinus bradycardia rate 35.  Prolonged QTC 564.  T wave inversion in aVL only.  Assessment/Plan Active Problems:   Bradycardia  Symptomatic bradycardia-sinus on EKG.  Mildly symptomatic with orthostatic dizziness.  Stable blood pressure.  No chest pain.  Likely metoprolol (XR) induced.  Heart rate up to 40 at the time of my evaluation.  On recent  hospitalization, patients heart rates were mostly in the 60s -D/c home metoprolol XR are 100 mg daily - Trend Trop - Check Mag TSH  Leukocytosis-17.6.  Likely Steroid drug-induced.  Denies GI, GU, neuro, respiratory, or musculoskeletal symptoms to suggest infection.  UA clean. -CBC a.m.  Hypertension- Systolic 063K to 160F.  On recent hospitalization amlodipine-HCTZ were discontinued due to AKI thought secondary to dehydration.  Currently on metoprolol, now bradycardic. -May need to resume Norvasc if blood pressure persistently elevated. -Discontinue home metoprolol  Diabetes mellitus-glucose 166.  Not on antihyperglycemic agents. HGBA1c 01/2018- 6.6 - SSI  CKD 4-stable.  Creatinine 3.29 improved compared to recent creatinine discharge.   Gouty arthritis-stable.  Recent hospitalization - gouty arthritic involvement left ankle. Completed course of prednisone today.  She was d/c home on colchicine and allopurinol, apparently she is not taking the allorpurinol.  - Continue colchicine  Prolonged QTC-564.  Patient bradycardic. -EKG a.m. -Check Magnesium- 2.3   DVT prophylaxis: Heparin Code Status: Full Family Communication: None at bedside Disposition Plan: 1 to 2 days Consults called: Cardiology in ED. Admission status: Observation, telemetry.   Bethena Roys MD [facility]  08/16/2018, 3:55 PM

## 2018-08-16 NOTE — ED Triage Notes (Signed)
Pt released from hospital on Wednesday last week due to AKI. Pt reports PT came to visit today and checked her pulse and it was in the 30's so they called her dr and was advised to come to ED. Pt reports mild dizziness when standing up. Pt took metoprolol prior to PT calling drs

## 2018-08-17 ENCOUNTER — Observation Stay (HOSPITAL_BASED_OUTPATIENT_CLINIC_OR_DEPARTMENT_OTHER): Payer: Medicaid Other

## 2018-08-17 DIAGNOSIS — I340 Nonrheumatic mitral (valve) insufficiency: Secondary | ICD-10-CM

## 2018-08-17 DIAGNOSIS — I361 Nonrheumatic tricuspid (valve) insufficiency: Secondary | ICD-10-CM

## 2018-08-17 LAB — CBC
HCT: 37.6 % (ref 36.0–46.0)
Hemoglobin: 11.4 g/dL — ABNORMAL LOW (ref 12.0–15.0)
MCH: 23.4 pg — ABNORMAL LOW (ref 26.0–34.0)
MCHC: 30.3 g/dL (ref 30.0–36.0)
MCV: 77 fL — ABNORMAL LOW (ref 80.0–100.0)
Platelets: 450 10*3/uL — ABNORMAL HIGH (ref 150–400)
RBC: 4.88 MIL/uL (ref 3.87–5.11)
RDW: 17.9 % — ABNORMAL HIGH (ref 11.5–15.5)
WBC: 18 10*3/uL — ABNORMAL HIGH (ref 4.0–10.5)
nRBC: 0 % (ref 0.0–0.2)

## 2018-08-17 LAB — TROPONIN I: Troponin I: <0.03 ng/mL (ref <0.03)

## 2018-08-17 LAB — GLUCOSE, CAPILLARY
Glucose-Capillary: 128 mg/dL — ABNORMAL HIGH (ref 70–99)
Glucose-Capillary: 138 mg/dL — ABNORMAL HIGH (ref 70–99)

## 2018-08-17 LAB — ECHOCARDIOGRAM LIMITED
Height: 62 in
Weight: 3912 oz

## 2018-08-17 MED ORDER — ATORVASTATIN CALCIUM 40 MG PO TABS: 40.0000 mg | ORAL_TABLET | Freq: Every day | ORAL | 0 refills | Status: DC

## 2018-08-17 MED ORDER — AMLODIPINE BESYLATE 5 MG PO TABS: 5.0000 mg | ORAL_TABLET | Freq: Every day | ORAL | 0 refills | Status: DC

## 2018-08-17 NOTE — Progress Notes (Signed)
Progress Note  Patient Name: Margaret Aguilar Date of Encounter: 08/17/2018  Primary Cardiologist: Katha Hamming  Subjective   No cardiac comoplaints   Inpatient Medications    Scheduled Meds: . atorvastatin  40 mg Oral q1800  . colchicine  0.3 mg Oral Daily  . heparin  5,000 Units Subcutaneous Q8H  . insulin aspart  0-9 Units Subcutaneous TID WC   Continuous Infusions:  PRN Meds: acetaminophen **OR** acetaminophen, polyethylene glycol, promethazine   Vital Signs    Vitals:   08/16/18 1830 08/16/18 1900 08/16/18 1939 08/17/18 0533  BP: (!) 155/65 (!) 152/71 (!) 171/89 (!) 146/81  Pulse: (!) 36 (!) 39 81 (!) 36  Resp: 15 (!) 22 16 20   Temp:   98.5 F (36.9 C) 98.5 F (36.9 C)  TempSrc:   Oral Oral  SpO2: 99% 97% 100% 99%  Weight:   110.9 kg   Height:   5\' 2"  (1.575 m)     Intake/Output Summary (Last 24 hours) at 08/17/2018 0830 Last data filed at 08/17/2018 0500 Gross per 24 hour  Intake 1144.43 ml  Output -  Net 1144.43 ml   Last 3 Weights 08/16/2018 08/16/2018 08/08/2018  Weight (lbs) 244 lb 8 oz 246 lb 14.6 oz 249 lb  Weight (kg) 110.904 kg 112 kg 112.946 kg      Telemetry    NSR rate 45-50 - Personally Reviewed  ECG    SB no acute changes no AV block - Personally Reviewed  Physical Exam   BP (!) 146/81 (BP Location: Right Arm)   Pulse (!) 36   Temp 98.5 F (36.9 C) (Oral)   Resp 20   Ht 5\' 2"  (1.575 m)   Wt 110.9 kg   SpO2 99%   BMI 44.72 kg/m   Obese black female  GEN: No acute distress.   Neck: No JVD Cardiac: RRR, no murmurs, rubs, or gallops.  Respiratory: Clear to auscultation bilaterally. GI: Soft, nontender, non-distended  MS: No edema; No deformity. Neuro:  Nonfocal  Psych: Normal affect   Labs    Chemistry Recent Labs  Lab 08/10/18 0853 08/11/18 0452 08/16/18 1307  NA 140 140 142  K 3.7 4.0 4.1  CL 108 109 109  CO2 22 21* 22  GLUCOSE 111* 165* 166*  BUN 87* 89* 67*  CREATININE 3.98* 4.00* 3.29*  CALCIUM 9.3 9.2 8.9   PROT  --   --  7.9  ALBUMIN  --   --  3.6  AST  --   --  22  ALT  --   --  49*  ALKPHOS  --   --  88  BILITOT  --   --  0.3  GFRNONAA 12* 12* 15*  GFRAA 14* 14* 17*  ANIONGAP 10 10 11      Hematology Recent Labs  Lab 08/16/18 1307 08/17/18 0030  WBC 17.6* 18.0*  RBC 4.89 4.88  HGB 11.3* 11.4*  HCT 37.5 37.6  MCV 76.7* 77.0*  MCH 23.1* 23.4*  MCHC 30.1 30.3  RDW 17.3* 17.9*  PLT 483* 450*    Cardiac Enzymes Recent Labs  Lab 08/16/18 1307 08/16/18 1919 08/17/18 0030  TROPONINI <0.03 <0.03 <0.03   No results for input(s): TROPIPOC in the last 168 hours.   BNPNo results for input(s): BNP, PROBNP in the last 168 hours.   DDimer No results for input(s): DDIMER in the last 168 hours.   Radiology    No results found.  Cardiac Studies   None  Patient Profile     58 y.o. female admitted with HTN and bradycardia HR improved holding Toprol  Assessment & Plan    1. Bradycardia: improved no AV block ok to d/c home off Toprol Outpatient cardiology f/u with Dr [name] 2. HTN: currently not on meds d/c on norvasc or hydralazine f/u primary 3. CRF:  Cr 3.98 K 3.7 this am f/u nephrology At risk for hyperkalemia no beta blockers in future    [facility] will sign off.   Medication Recommendations:  D/C Toprol  Other recommendations (labs, testing, etc):    Follow up as an outpatient:  Dr [name]  For questions or updates, please contact [facility] Please consult [email or web address] for contact info under        Signed, Jenkins Rouge, MD  08/17/2018, 8:30 AM

## 2018-08-17 NOTE — Discharge Instructions (Signed)
Please follow up with Dr. [name] in 2 weeks Please follow up with primary care in 1 week to check blood pressure Please follow up with nephrologist in 3 weeks  Bradycardia, Adult Bradycardia is a slower-than-normal heartbeat. A normal resting heart rate for an adult ranges from 60 to 100 beats per minute. With bradycardia, the resting heart rate is less than 60 beats per minute. Bradycardia can prevent enough oxygen from reaching certain areas of your body when you are active. It can be serious if it keeps enough oxygen from reaching your brain and other parts of your body. Bradycardia is not a problem for everyone. For some healthy adults, a slow resting heart rate is normal. What are the causes? This condition may be caused by:  A problem with the heart, including: ? A problem with the heart's electrical system, such as a heart block. With a heart block, electrical signals between the chambers of the heart are partially or completely blocked, so they are not able to work as they should. ? A problem with the heart's natural pacemaker (sinus node). ? Heart disease. ? A heart attack. ? Heart damage. ? Lyme disease. ? A heart infection. ? A heart condition that is present at birth (congenital heart defect).  Certain medicines that treat heart conditions.  Certain conditions, such as hypothyroidism and obstructive sleep apnea.  Problems with the balance of chemicals and other substances, like potassium, in the blood.  Trauma.  Radiation therapy. What increases the risk? You are more likely to develop this condition if you:  Are age 57 or older.  Have high blood pressure (hypertension), high cholesterol (hyperlipidemia), or diabetes.  Drink heavily, use tobacco or nicotine products, or use drugs. What are the signs or symptoms? Symptoms of this condition include:  Light-headedness.  Feeling faint or fainting.  Fatigue and weakness.  Trouble with activity or  exercise.  Shortness of breath.  Chest pain (angina).  Drowsiness.  Confusion.  Dizziness. How is this diagnosed? This condition may be diagnosed based on:  Your symptoms.  Your medical history.  A physical exam. During the exam, your health care provider will listen to your heartbeat and check your pulse. To confirm the diagnosis, your health care provider may order tests, such as:  Blood tests.  An electrocardiogram (ECG). This test records the heart's electrical activity. The test can show how fast your heart is beating and whether the heartbeat is steady.  A test in which you wear a portable device (event recorder or Holter monitor) to record your heart's electrical activity while you go about your day.  Anexercise test. How is this treated? Treatment for this condition depends on the cause of the condition and how severe your symptoms are. Treatment may involve:  Treatment of the underlying condition.  Changing your medicines or how much medicine you take.  Having a small, battery-operated device called a pacemaker implanted under the skin. When bradycardia occurs, this device can be used to increase your heart rate and help your heart beat in a regular rhythm. Follow these instructions at home: Lifestyle   Manage any health conditions that contribute to bradycardia as told by your health care provider.  Follow a heart-healthy diet. A nutrition specialist (dietitian) can help educate you about healthy food options and changes.  Follow an exercise program that is approved by your health care provider.  Maintain a healthy weight.  Try to reduce or manage your stress, such as with yoga or meditation. If you  need help reducing stress, ask your health care provider.  Do not use any products that contain nicotine or tobacco, such as cigarettes, e-cigarettes, and chewing tobacco. If you need help quitting, ask your health care provider.  Do not use illegal  drugs.  Limit alcohol intake to no more than 1 drink a day for nonpregnant women and 2 drinks a day for men. Be aware of how much alcohol is in your drink. In the [place] one drink equals one 12 oz bottle of beer (355 mL), one 5 oz glass of wine (148 mL), or one 1 oz glass of hard liquor (44 mL). General instructions  Take over-the-counter and prescription medicines only as told by your health care provider.  Keep all follow-up visits as told by your health care provider. This is important. How is this prevented? In some cases, bradycardia may be prevented by:  Treating underlying medical problems.  Stopping behaviors or medicines that can trigger the condition. Contact a health care provider if you:  Feel light-headed or dizzy.  Almost faint.  Feel weak or are easily fatigued during physical activity.  Experience confusion or have memory problems. Get help right away if:  You faint.  You have: ? An irregular heartbeat (palpitations). ? Chest pain. ? Trouble breathing. Summary  Bradycardia is a slower-than-normal heartbeat. With bradycardia, the resting heart rate is less than 60 beats per minute.  Treatment for this condition depends on the cause.  Manage any health conditions that contribute to bradycardia as told by your health care provider.  Do not use any products that contain nicotine or tobacco, such as cigarettes, e-cigarettes, and chewing tobacco, and limit alcohol intake.  Keep all follow-up visits as told by your health care provider. This is important. This information is not intended to replace advice given to you by your health care provider. Make sure you discuss any questions you have with your health care provider. Document Released: 12/21/2001 Document Revised: 09/09/2017 Document Reviewed: 09/09/2017 Elsevier Interactive Patient Education  2019 [place] for Chronic Kidney Disease When your kidneys are not working well, they cannot  remove waste and excess substances from your blood as effectively as they did before. This can lead to a buildup and imbalance of these substances, which can worsen kidney damage and affect how your body functions. Certain foods lead to a buildup of these substances in the body. By changing your diet as recommended by your diet and nutrition specialist (dietitian) or health care provider, you could help prevent further kidney damage and delay or prevent the need for dialysis. What are tips for following this plan? General instructions   Work with your health care provider and dietitian to develop a meal plan that is right for you. Foods you can eat, limit, or avoid will be different for each person depending on the stage of kidney disease and any other existing health conditions.  Talk with your health care provider about whether you should take a vitamin and mineral supplement.  Use standard measuring cups and spoons to measure servings of foods. Use a kitchen scale to measure portions of protein foods.  If directed by your health care provider, avoid drinking too much fluid. Measure and count all liquids, including water, ice, soups, flavored gelatin, and frozen desserts such as popsicles or ice cream. Reading food labels  Check the amount of sodium in foods. Choose foods that have less than 300 milligrams (mg) per serving.  Check the ingredient list  for phosphorus or potassium-based additives or preservatives.  Check the amount of saturated and trans fat. Limit or avoid these fats as told by your dietitian. Shopping  Avoid buying foods that are: ? Processed, frozen, or prepackaged. ? Calcium-enriched or fortified.  Do not buy foods that have salt or sodium listed among the first five ingredients.  Do not buy canned vegetables. Cooking  Replace animal proteins, such as meat, fish, eggs, or dairy, with plant proteins from beans, nuts, and soy. ? Use soy milk instead of cow's  milk. ? Add beans or tofu to soups, casseroles, or pasta dishes instead of meat.  Soak vegetables, such as potatoes, before cooking to reduce potassium. To do this: ? Peel and cut into small pieces. ? Soak in warm water for at least 2 hours. For every 1 cup of vegetables, use 10 cups of water. ? Drain and rinse with warm water. ? Boil for at least 5 minutes. Meal planning  Limit the amount of protein from plant and animal sources you eat each day.  Do not add salt to food when cooking or before eating.  Eat meals and snacks at around the same time each day. If you have diabetes:  If you have diabetes (diabetes mellitus) and chronic kidney disease, it is important to keep your blood glucose in the target range recommended by your health care provider. Follow your diabetes management plan. This may include: ? Checking your blood glucose regularly. ? Taking oral medicines, insulin, or both. ? Exercising for at least 30 minutes on 5 or more days each week, or as told by your health care provider. ? Tracking how many servings of carbohydrates you eat at each meal.  You may be given specific guidelines on how much of certain foods and nutrients you may eat, depending on your stage of kidney disease and whether you have high blood pressure (hypertension). Follow your meal plan as told by your dietitian. What nutrients should be limited? The items listed are not a complete list. Talk with your dietitian about what dietary choices are best for you. Potassium Potassium affects how steadily your heart beats. If too much potassium builds up in your blood, it can cause an irregular heartbeat or even a heart attack. You may need to eat less potassium, depending on your blood potassium levels and the stage of kidney disease. Talk to your dietitian about how much potassium you may have each day. You may need to limit or avoid foods that are high in potassium, such as:  Milk and soy milk.  Fruits,  such as bananas, papaya, apricots, nectarines, melon, prunes, raisins, kiwi, and oranges.  Vegetables, such as potatoes, sweet potatoes, yams, tomatoes, leafy greens, beets, okra, avocado, pumpkin, and winter squash.  White and lima beans. Phosphorus Phosphorus is a mineral found in your bones. A balance between calcium and phosphorous is needed to build and maintain healthy bones. Too much phosphorus pulls calcium from your bones. This can make your bones weak and more likely to break. Too much phosphorus can also make your skin itch. You may need to eat less phosphorus depending on your blood phosphorus levels and the stage of kidney disease. Talk to your dietitian about how much potassium you may have each day. You may need to take medicine to lower your blood phosphorus levels if diet changes do not help. You may need to limit or avoid foods that are high in phosphorus, such as:  Milk and dairy products.  Dried  beans and peas.  Tofu, soy milk, and other soy-based meat replacements.  Colas.  Nuts and peanut butter.  Meat, poultry, and fish.  Bran cereals and oatmeals. Protein Protein helps you to make and keep muscle. It also helps in the repair of your bodys cells and tissues. One of the natural breakdown products of protein is a waste product called urea. When your kidneys are not working properly, they cannot remove wastes, such as urea, like they did before you developed chronic kidney disease. Reducing how much protein you eat can help prevent a buildup of urea in your blood. Depending on your stage of kidney disease, you may need to limit foods that are high in protein. Sources of animal protein include:  Meat (all types).  Fish and seafood.  Poultry.  Eggs.  Dairy. Other protein foods include:  Beans and legumes.  Nuts and nut butter.  Soy and tofu. Sodium Sodium, which is found in salt, helps maintain a healthy balance of fluids in your body. Too much sodium  can increase your blood pressure and have a negative effect on the function of your heart and lungs. Too much sodium can also cause your body to retain too much fluid, making your kidneys work harder. Most people should have less than 2,300 milligrams (mg) of sodium each day. If you have hypertension, you may need to limit your sodium to 1,500 mg each day. Talk to your dietitian about how much sodium you may have each day. You may need to limit or avoid foods that are high in sodium, such as:  Salt seasonings.  Soy sauce.  Cured and processed meats.  Salted crackers and snack foods.  Fast food.  Canned soups and most canned foods.  Pickled foods.  Vegetable juice.  Boxed mixes or ready-to-eat boxed meals and side dishes.  Bottled dressings, sauces, and marinades. Summary  Chronic kidney disease can lead to a buildup and imbalance of waste and excess substances in the body. Certain foods lead to a buildup of these substances. By adjusting your intake of these foods, you could help prevent more kidney damage and delay or prevent the need for dialysis.  Food adjustments are different for each person with chronic kidney disease. Work with a dietitian to set up nutrient goals and a meal plan that is right for you.  If you have diabetes and chronic kidney disease, it is important to keep your blood glucose in the target range recommended by your health care provider. This information is not intended to replace advice given to you by your health care provider. Make sure you discuss any questions you have with your health care provider. Document Released: 06/21/2002 Document Revised: 03/26/2016 Document Reviewed: 03/26/2016 Elsevier Interactive Patient Education  2019 [employer]   IMPORTANT INFORMATION: PAY CLOSE ATTENTION   PHYSICIAN DISCHARGE INSTRUCTIONS  Follow with Primary care provider  [name], MD  and other consultants as instructed your Hospitalist Physician  [facility] IF SYMPTOMS COME BACK, WORSEN OR NEW PROBLEM DEVELOPS.   Please note: You were cared for by a hospitalist during your hospital stay. Every effort will be made to forward records to your primary care provider.  You can request that your primary care provider send for your hospital records if they have not received them.  Once you are discharged, your primary care physician will handle any further medical issues. Please note that NO REFILLS for any discharge medications will be authorized once  you are discharged, as it is imperative that you return to your primary care physician (or establish a relationship with a primary care physician if you do not have one) for your post hospital discharge needs so that they can reassess your need for medications and monitor your lab values.  Please get a complete blood count and chemistry panel checked by your Primary MD at your next visit, and again as instructed by your Primary MD.  Get Medicines reviewed and adjusted: Please take all your medications with you for your next visit with your Primary MD  Laboratory/radiological data: Please request your Primary MD to go over all hospital tests and procedure/radiological results at the follow up, please ask your primary care provider to get all Hospital records sent to his/her office.  In some cases, they will be blood work, cultures and biopsy results pending at the time of your discharge. Please request that your primary care provider follow up on these results.  If you are diabetic, please bring your blood sugar readings with you to your follow up appointment with primary care.    Please call and make your follow up appointments as soon as possible.    Also Note the following: If you experience worsening of your admission symptoms, develop shortness of breath, life threatening emergency, suicidal or homicidal thoughts you must seek medical attention immediately by  calling 911 or calling your MD immediately  if symptoms less severe.  You must read complete instructions/literature along with all the possible adverse reactions/side effects for all the Medicines you take and that have been prescribed to you. Take any new Medicines after you have completely understood and accpet all the possible adverse reactions/side effects.   Do not drive when taking Pain medications or sleeping medications (Benzodiazepines)  Do not take more than prescribed Pain, Sleep and Anxiety Medications. It is not advisable to combine anxiety,sleep and pain medications without talking with your primary care practitioner  Special Instructions: If you have smoked or chewed Tobacco  in the last 2 yrs please stop smoking, stop any regular Alcohol  and or any Recreational drug use.  Wear Seat belts while driving.

## 2018-08-17 NOTE — Discharge Summary (Signed)
Physician Discharge Summary  Margaret Aguilar:[id number] DOB: [birth year] DOA: 08/16/2018  PCP: Margaret Forward, MD Nephrologist: Margaret Aguilar  Admit date: 08/16/2018 Discharge date: 08/17/2018  Admitted From:  Home  Disposition: Home   Recommendations for Outpatient Follow-up:  1. Follow up with PCP in 1 weeks 2. Follow up with cardiology in 1 week  Discharge Condition: STABLE   CODE STATUS: FULL    Brief Hospitalization Summary: Please see all hospital notes, images, labs for full details of the hospitalization. Dr. Talmadge Aguilar HPI: Margaret Aguilar is a 58 y.o. female with medical history significant for CKD 4, hypertension, gout, diabetes mellitus, who presented to the ED with reports of slow heart rate.  Patient was about to work with physical therapy today when her vitals were checked, heart rate was in the 30s.  Patient's primary care provider was called and patient was to go to the emergency department, and not to take her metoprolol unfortunately patient had already just taking her metoprolol.  Patient denies taking more than prescribed dose 100 mg once daily. Patient reports some dizziness/grogginess when standing/ambulating.  She otherwise denies difficulty breathing, no chest pain, no generalized weakness.  She reports initial constipation and then daily bowel movement since discharge, no vomiting, no burning with urination, no fevers no chills, no sore throat no, no known contact with sick/COVID positive patients.   Recent hospitalization 4/26 to 4/29 for gouty arthritis involving her left ankle and pre-renal acute kidney injury.  Patient was hydrated, and treated with steroids.  Home antihypertensives Norvasc, HCTZ, metoprolol were held on admission for soft blood pressure and dehydration.  Patient's home metoprolol was resumed on discharge.  Patient's left foot has done well since discharge.  ED Course: heart rate 33- 38, with blood pressure 140s- 180s, sats 100% on room air.  Cr  improved at 3.29, compared to recent discharge Cr. WBC elevated at 17.6. Clean UA.  SARS Covid checked in the ED negative.  EKG- sinus bradycardia with rate 35. Cardiology consulted in ED, hospitalist to admit for sinus bradycardia likely induced by her chronic BB therapy.   The patient was admitted with bradycardia that was noticed by physical therapy when they visited her at her home and found her heart rate to be in the 30s.  She had been taking Toprol-XL for her blood pressure.  She did not have a history of bradycardia associated with this in the past.  She had been recently discharged and late April with an acute gouty arthritis.  She has been started on a statin for her hyperlipidemia.  She was monitored for her diabetes which remained stable.  She was seen by cardiology and they have recommended discontinuing Toprol-XL.  She has been started on amlodipine 5 mg daily for blood pressure and she should follow-up with Dr. Terrence Aguilar for cardiology follow-up and also with her nephrologist Margaret Aguilar.   She is stable for discharge home.  Discharge Diagnoses:  Active Problems:   Bradycardia   Discharge Instructions: Discharge Instructions    Increase activity slowly   Complete by:  As directed      Allergies as of 08/17/2018      Reactions   Nutritional Supplements    walnut   Latex       Medication List    STOP taking these medications   allopurinol 100 MG tablet Commonly known as:  ZYLOPRIM   traMADol 50 MG tablet Commonly known as:  ULTRAM     TAKE these medications   acetaminophen 500  MG tablet Commonly known as:  TYLENOL Take 2 tablets (1,000 mg total) by mouth every 6 (six) hours as needed.   albuterol 108 (90 Base) MCG/ACT inhaler Commonly known as:  VENTOLIN HFA Inhale 2 puffs into the lungs every 6 (six) hours as needed for wheezing or shortness of breath.   amLODipine 5 MG tablet Commonly known as:  NORVASC Take 1 tablet (5 mg total) by mouth daily for 30 days.    atorvastatin 40 MG tablet Commonly known as:  LIPITOR Take 1 tablet (40 mg total) by mouth daily at 6 PM for 30 days.   colchicine 0.6 MG tablet Take 0.5 tablets (0.3 mg total) by mouth daily for 30 days.   glucose blood test strip Commonly known as:  True Metrix Blood Glucose Test Use as instructed to check blood sugars twice per day   True Metrix Air Glucose Meter w/Device Kit 1 Device by Does not apply route 2 (two) times daily at 8 am and 10 pm.   TRUEplus Lancets 28G Misc Use to check blood sugar twice per day      Follow-up Information    Margaret Forward, MD. Schedule an appointment as soon as possible for a visit in 1 week(s).   Specialty:  Cardiology Why:  Hospital Follow Up  Contact information: [place] [address] [place] [address] [phone]        Donato Heinz, MD. Schedule an appointment as soon as possible for a visit in 2 week(s).   Specialty:  Nephrology Why:  Hospital Follow Up  Contact information: [address] [name] [address] [phone]          Allergies  Allergen Reactions  . Nutritional Supplements     walnut  . Latex    Allergies as of 08/17/2018      Reactions   Nutritional Supplements    walnut   Latex       Medication List    STOP taking these medications   allopurinol 100 MG tablet Commonly known as:  ZYLOPRIM   traMADol 50 MG tablet Commonly known as:  ULTRAM     TAKE these medications   acetaminophen 500 MG tablet Commonly known as:  TYLENOL Take 2 tablets (1,000 mg total) by mouth every 6 (six) hours as needed.   albuterol 108 (90 Base) MCG/ACT inhaler Commonly known as:  VENTOLIN HFA Inhale 2 puffs into the lungs every 6 (six) hours as needed for wheezing or shortness of breath.   amLODipine 5 MG tablet Commonly known as:  NORVASC Take 1 tablet (5 mg total) by mouth daily for 30 days.   atorvastatin 40 MG tablet Commonly known as:  LIPITOR Take 1 tablet (40 mg total) by mouth  daily at 6 PM for 30 days.   colchicine 0.6 MG tablet Take 0.5 tablets (0.3 mg total) by mouth daily for 30 days.   glucose blood test strip Commonly known as:  True Metrix Blood Glucose Test Use as instructed to check blood sugars twice per day   True Metrix Air Glucose Meter w/Device Kit 1 Device by Does not apply route 2 (two) times daily at 8 am and 10 pm.   TRUEplus Lancets 28G Misc Use to check blood sugar twice per day       Procedures/Studies: Dg Ankle Complete Left  Result Date: 08/08/2018 CLINICAL DATA:  Left ankle pain for 5 days. No known injury. History of diabetes and chronic kidney disease. EXAM: LEFT ANKLE COMPLETE -  3+ VIEW COMPARISON:  Foot radiographs 07/04/2016. Ankle radiographs 12/29/2013. FINDINGS: The mineralization and alignment are normal. There is no evidence of acute fracture or dislocation. The joint spaces are preserved. Mild diffuse soft tissue swelling without evidence of foreign body or soft tissue emphysema. There are stable small calcaneal spurs. IMPRESSION: No acute osseous findings.  Mild diffuse soft tissue swelling. Electronically Signed   By: Richardean Sale M.D.   On: 08/08/2018 12:54   US Renal  Result Date: 08/09/2018 CLINICAL DATA:  Acute kidney injury. EXAM: RENAL / URINARY TRACT ULTRASOUND COMPLETE COMPARISON:  Ultrasound of November 06, 2014. FINDINGS: Right Kidney: Renal measurements: 9.1 x 4.2 x 3.8 cm = volume: 77 mL. Increased echogenicity of renal parenchyma is noted. 1.4 cm simple cyst is noted in midpole. No mass or hydronephrosis visualized. Left Kidney: Renal measurements: 9.8 x 5.0 x 4.9 cm = volume: 126 mL. Increased echogenicity of renal parenchyma is noted. 9 mm simple cyst is seen in upper pole. No mass or hydronephrosis visualized. Bladder: Appears normal for degree of bladder distention. Ureteral jets are not visualized. IMPRESSION: Increased echogenicity of renal parenchyma is noted bilaterally suggesting medical renal disease. No  hydronephrosis or renal obstruction is noted. Electronically Signed   By: Marijo Conception M.D.   On: 08/09/2018 09:17      Subjective: Patient without complaints.  She would like to go home.  Discharge Exam: Vitals:   08/16/18 1939 08/17/18 0533  BP: (!) 171/89 (!) 146/81  Pulse: 81 (!) 36  Resp: 16 20  Temp: 98.5 F (36.9 C) 98.5 F (36.9 C)  SpO2: 100% 99%   Vitals:   08/16/18 1830 08/16/18 1900 08/16/18 1939 08/17/18 0533  BP: (!) 155/65 (!) 152/71 (!) 171/89 (!) 146/81  Pulse: (!) 36 (!) 39 81 (!) 36  Resp: 15 (!) _0 Temp:   98.5 F (36.9 C) 98.5 F (36.9 C)  TempSrc:   Oral Oral  SpO2: 99% 97% 100% 99%  Weight:   110.9 kg   Height:   _1  (1.575 m)    General: Pt is alert, awake, not in acute distress Cardiovascular: normal S1/S2 +, no rubs, no gallops Respiratory: CTA bilaterally, no wheezing, no rhonchi Abdominal: Soft, NT, ND, bowel sounds + Extremities: no edema, no cyanosis   The results of significant diagnostics from this hospitalization (including imaging, microbiology, ancillary and laboratory) are listed below for reference.     Microbiology: Recent Results (from the past 240 hour(s))  SARS Coronavirus 2 (CEPHEID - Performed in [facility] hospital lab), Hosp Order     Status: None   Collection Time: 08/16/18  2:59 PM  Result Value Ref Range Status   SARS Coronavirus 2 NEGATIVE NEGATIVE Final    Comment: (NOTE) If result is NEGATIVE SARS-CoV-2 target nucleic acids are NOT DETECTED. The SARS-CoV-2 RNA is generally detectable in upper and lower  respiratory specimens during the acute phase of infection. The lowest  concentration of SARS-CoV-2 viral copies this assay can detect is 250  copies / mL. A negative result does not preclude SARS-CoV-2 infection  and should not be used as the sole basis for treatment or other  patient management decisions.  A negative result may occur with  improper specimen collection / handling, submission of  specimen other  than nasopharyngeal swab, presence of viral mutation(s) within the  areas targeted by this assay, and inadequate number of viral copies  (<250 copies / mL). A negative result must be combined  with clinical  observations, patient history, and epidemiological information. If result is POSITIVE SARS-CoV-2 target nucleic acids are DETECTED. The SARS-CoV-2 RNA is generally detectable in upper and lower  respiratory specimens dur ing the acute phase of infection.  Positive  results are indicative of active infection with SARS-CoV-2.  Clinical  correlation with patient history and other diagnostic information is  necessary to determine patient infection status.  Positive results do  not rule out bacterial infection or co-infection with other viruses. If result is PRESUMPTIVE POSTIVE SARS-CoV-2 nucleic acids MAY BE PRESENT.   A presumptive positive result was obtained on the submitted specimen  and confirmed on repeat testing.  While 2019 novel coronavirus  (SARS-CoV-2) nucleic acids may be present in the submitted sample  additional confirmatory testing may be necessary for epidemiological  and / or clinical management purposes  to differentiate between  SARS-CoV-2 and other Sarbecovirus currently known to infect humans.  If clinically indicated additional testing with an alternate test  methodology [phone]) is advised. The SARS-CoV-2 RNA is generally  detectable in upper and lower respiratory sp ecimens during the acute  phase of infection. The expected result is Negative. Fact Sheet for Patients:  StrictlyIdeas.no Fact Sheet for Healthcare Providers: BankingDealers.co.za This test is not yet approved or cleared by the [place] FDA and has been authorized for detection and/or diagnosis of SARS-CoV-2 by FDA under an Emergency Use Authorization (EUA).  This EUA will remain in effect (meaning this test can be used) for the  duration of the COVID-19 declaration under Section 564(b)(1) of the Act, 21 U.S.C. section 360bbb-3(b)(1), unless the authorization is terminated or revoked sooner. Performed at [facility], [address], [place], [place] [address]      Labs: BNP (last 3 results) No results for input(s): BNP in the last 8760 hours. Basic Metabolic Panel: Recent Labs  Lab 08/11/18 0452 08/16/18 1307  NA 140 142  K 4.0 4.1  CL 109 109  CO2 21* 22  GLUCOSE 165* 166*  BUN 89* 67*  CREATININE 4.00* 3.29*  CALCIUM 9.2 8.9  MG  --  2.3   Liver Function Tests: Recent Labs  Lab 08/16/18 1307  AST 22  ALT 49*  ALKPHOS 88  BILITOT 0.3  PROT 7.9  ALBUMIN 3.6   No results for input(s): LIPASE, AMYLASE in the last 168 hours. No results for input(s): AMMONIA in the last 168 hours. CBC: Recent Labs  Lab 08/16/18 1307 08/17/18 0030  WBC 17.6* 18.0*  NEUTROABS 15.3*  --   HGB 11.3* 11.4*  HCT 37.5 37.6  MCV 76.7* 77.0*  PLT 483* 450*   Cardiac Enzymes: Recent Labs  Lab 08/16/18 1307 08/16/18 1919 08/17/18 0030  TROPONINI <0.03 <0.03 <0.03   BNP: Invalid input(s): POCBNP CBG: Recent Labs  Lab 08/10/18 2114 08/11/18 0740 08/11/18 1129 08/16/18 2224 08/17/18 0717  GLUCAP 146* 111* 152* 177* 128*   D-Dimer No results for input(s): DDIMER in the last 72 hours. Hgb A1c No results for input(s): HGBA1C in the last 72 hours. Lipid Profile No results for input(s): CHOL, HDL, LDLCALC, TRIG, CHOLHDL, LDLDIRECT in the last 72 hours. Thyroid function studies Recent Labs    08/16/18 1307  TSH 0.682   Anemia work up No results for input(s): VITAMINB12, FOLATE, FERRITIN, TIBC, IRON, RETICCTPCT in the last 72 hours. Urinalysis    Component Value Date/Time   COLORURINE STRAW (A) 08/16/2018 1307   APPEARANCEUR HAZY (A) 08/16/2018 1307   LABSPEC 1.011 08/16/2018 1307  PHURINE 5.0 08/16/2018 1307   GLUCOSEU NEGATIVE 08/16/2018 1307   HGBUR NEGATIVE 08/16/2018 [place] 08/16/2018 [place] 08/16/2018 1307   PROTEINUR NEGATIVE 08/16/2018 1307   NITRITE NEGATIVE 08/16/2018 1307   LEUKOCYTESUR NEGATIVE 08/16/2018 1307   Sepsis Labs Invalid input(s): PROCALCITONIN,  WBC,  LACTICIDVEN Microbiology Recent Results (from the past 240 hour(s))  SARS Coronavirus 2 (CEPHEID - Performed in [place] hospital lab), Hosp Order     Status: None   Collection Time: 08/16/18  2:59 PM  Result Value Ref Range Status   SARS Coronavirus 2 NEGATIVE NEGATIVE Final    Comment: (NOTE) If result is NEGATIVE SARS-CoV-2 target nucleic acids are NOT DETECTED. The SARS-CoV-2 RNA is generally detectable in upper and lower  respiratory specimens during the acute phase of infection. The lowest  concentration of SARS-CoV-2 viral copies this assay can detect is 250  copies / mL. A negative result does not preclude SARS-CoV-2 infection  and should not be used as the sole basis for treatment or other  patient management decisions.  A negative result may occur with  improper specimen collection / handling, submission of specimen other  than nasopharyngeal swab, presence of viral mutation(s) within the  areas targeted by this assay, and inadequate number of viral copies  (<250 copies / mL). A negative result must be combined with clinical  observations, patient history, and epidemiological information. If result is POSITIVE SARS-CoV-2 target nucleic acids are DETECTED. The SARS-CoV-2 RNA is generally detectable in upper and lower  respiratory specimens dur ing the acute phase of infection.  Positive  results are indicative of active infection with SARS-CoV-2.  Clinical  correlation with patient history and other diagnostic information is  necessary to determine patient infection status.  Positive results do  not rule out bacterial infection or co-infection with other viruses. If result is PRESUMPTIVE POSTIVE SARS-CoV-2 nucleic acids MAY BE  PRESENT.   A presumptive positive result was obtained on the submitted specimen  and confirmed on repeat testing.  While 2019 novel coronavirus  (SARS-CoV-2) nucleic acids may be present in the submitted sample  additional confirmatory testing may be necessary for epidemiological  and / or clinical management purposes  to differentiate between  SARS-CoV-2 and other Sarbecovirus currently known to infect humans.  If clinically indicated additional testing with an alternate test  methodology [phone]) is advised. The SARS-CoV-2 RNA is generally  detectable in upper and lower respiratory sp ecimens during the acute  phase of infection. The expected result is Negative. Fact Sheet for Patients:  StrictlyIdeas.no Fact Sheet for Healthcare Providers: BankingDealers.co.za This test is not yet approved or cleared by the [place] FDA and has been authorized for detection and/or diagnosis of SARS-CoV-2 by FDA under an Emergency Use Authorization (EUA).  This EUA will remain in effect (meaning this test can be used) for the duration of the COVID-19 declaration under Section 564(b)(1) of the Act, 21 U.S.C. section 360bbb-3(b)(1), unless the authorization is terminated or revoked sooner. Performed at [facility], [address], [place], [place] [address]    Time coordinating discharge:  SIGNED:  Irwin Brakeman, MD  [facility] 08/17/2018, 11:07 AM How to contact the [facility] Attending or Consulting provider [place] or covering provider during after hours [place], for this patient?  1. Check the care team in [facility] and look for a) attending/consulting [facility] provider listed and b) the [facility] team listed 2. Log into [email or web address] and use [facility]'s universal password to access. If you do not have the password, please contact the hospital operator. 3. Locate the [facility] provider you are looking for under [facility] and page to a number that you  can be directly reached. 4. If you still have difficulty reaching the provider, please page the [facility] (Director on Call) for the Hospitalists listed on amion for assistance.

## 2018-08-17 NOTE — Progress Notes (Signed)
*  PRELIMINARY RESULTS* Echocardiogram 2D Echocardiogram limited has been performed.  Leavy Cella 08/17/2018, 10:54 AM

## 2018-08-17 NOTE — Progress Notes (Signed)
PT DISCHARGED HOME, IV REMOVED, ANGIO INTACT, VS STABLE, PT DENIES C/O PAIN. PT VERBALIZED UNDERSTANDING OF ALL INSTRUCTIONS PROVIDED, LEFT FLOOR VIA WHEELCHAIR ACCOMPANIED BY NURSING STAFF WITH BELONGINGS INTACT. PT MET AT SHORT STAY ENTRANCE BY FAMILY MEMBER FOR TRANSPORTATION HOME. 

## 2018-08-23 DIAGNOSIS — N189 Chronic kidney disease, unspecified: Secondary | ICD-10-CM | POA: Diagnosis not present

## 2018-08-23 DIAGNOSIS — R001 Bradycardia, unspecified: Secondary | ICD-10-CM | POA: Diagnosis not present

## 2018-08-23 DIAGNOSIS — E1122 Type 2 diabetes mellitus with diabetic chronic kidney disease: Secondary | ICD-10-CM | POA: Diagnosis not present

## 2018-08-23 DIAGNOSIS — I10 Essential (primary) hypertension: Secondary | ICD-10-CM | POA: Diagnosis not present

## 2018-09-21 DIAGNOSIS — N2581 Secondary hyperparathyroidism of renal origin: Secondary | ICD-10-CM | POA: Diagnosis not present

## 2018-09-21 DIAGNOSIS — N189 Chronic kidney disease, unspecified: Secondary | ICD-10-CM | POA: Diagnosis not present

## 2018-09-21 DIAGNOSIS — N179 Acute kidney failure, unspecified: Secondary | ICD-10-CM | POA: Diagnosis not present

## 2018-09-21 DIAGNOSIS — N184 Chronic kidney disease, stage 4 (severe): Secondary | ICD-10-CM | POA: Diagnosis not present

## 2018-09-21 DIAGNOSIS — M109 Gout, unspecified: Secondary | ICD-10-CM | POA: Diagnosis not present

## 2018-09-21 DIAGNOSIS — D631 Anemia in chronic kidney disease: Secondary | ICD-10-CM | POA: Diagnosis not present

## 2018-09-21 DIAGNOSIS — I129 Hypertensive chronic kidney disease with stage 1 through stage 4 chronic kidney disease, or unspecified chronic kidney disease: Secondary | ICD-10-CM | POA: Diagnosis not present

## 2018-09-21 DIAGNOSIS — E1129 Type 2 diabetes mellitus with other diabetic kidney complication: Secondary | ICD-10-CM | POA: Diagnosis not present

## 2018-09-27 DIAGNOSIS — I10 Essential (primary) hypertension: Secondary | ICD-10-CM | POA: Diagnosis not present

## 2018-09-27 DIAGNOSIS — N189 Chronic kidney disease, unspecified: Secondary | ICD-10-CM | POA: Diagnosis not present

## 2018-09-27 DIAGNOSIS — G4733 Obstructive sleep apnea (adult) (pediatric): Secondary | ICD-10-CM | POA: Diagnosis not present

## 2018-09-27 DIAGNOSIS — E1122 Type 2 diabetes mellitus with diabetic chronic kidney disease: Secondary | ICD-10-CM | POA: Diagnosis not present

## 2018-09-27 DIAGNOSIS — E785 Hyperlipidemia, unspecified: Secondary | ICD-10-CM | POA: Diagnosis not present

## 2018-10-04 ENCOUNTER — Other Ambulatory Visit: Payer: Self-pay

## 2018-10-04 DIAGNOSIS — N184 Chronic kidney disease, stage 4 (severe): Secondary | ICD-10-CM

## 2018-10-07 ENCOUNTER — Telehealth (HOSPITAL_COMMUNITY): Payer: Self-pay | Admitting: Rehabilitation

## 2018-10-07 NOTE — Telephone Encounter (Signed)

## 2018-10-08 ENCOUNTER — Encounter: Payer: Self-pay | Admitting: Vascular Surgery

## 2018-10-08 ENCOUNTER — Other Ambulatory Visit: Payer: Self-pay

## 2018-10-08 ENCOUNTER — Ambulatory Visit (HOSPITAL_COMMUNITY)
Admission: RE | Admit: 2018-10-08 | Discharge: 2018-10-08 | Disposition: A | Discharge status: Home / Self Care | Payer: Medicaid Other | Source: Ambulatory Visit | Attending: Vascular Surgery | Admitting: Vascular Surgery

## 2018-10-08 ENCOUNTER — Ambulatory Visit (INDEPENDENT_AMBULATORY_CARE_PROVIDER_SITE_OTHER)
Admission: RE | Admit: 2018-10-08 | Discharge: 2018-10-08 | Disposition: A | Discharge status: Home / Self Care | Payer: Medicaid Other | Source: Ambulatory Visit | Attending: Vascular Surgery | Admitting: Vascular Surgery

## 2018-10-08 ENCOUNTER — Ambulatory Visit (INDEPENDENT_AMBULATORY_CARE_PROVIDER_SITE_OTHER): Payer: Medicaid Other | Admitting: Vascular Surgery

## 2018-10-08 VITALS — BP 178/90 | HR 72 | Temp 97.0°F | Resp 20 | Ht 62.0 in | Wt 239.7 lb

## 2018-10-08 DIAGNOSIS — N184 Chronic kidney disease, stage 4 (severe): Secondary | ICD-10-CM

## 2018-10-08 NOTE — Progress Notes (Signed)
Patient ID: Margaret Aguilar, female   DOB: Jan 08, 1961, 58 y.o.   MRN: [id number]  Reason for Consult: New Patient (Initial Visit)   Referred by [name], MD  Subjective:     HPI:  Margaret Aguilar is a 58 y.o. female recently admitted with chronic kidney disease stage IV as well as gout.  Now states that her pain is better controlled.  Has never been on dialysis.  Does have some understanding of dialysis.  She is diabetic and hypertensive as risk factors.  Has never had chest breast or arm surgery.  She is right-hand dominant.  Past Medical History:  Diagnosis Date  . Arthritis   . Diabetes mellitus without complication ([place])    x 5 yrs  . Herpes   . Hypertension   . Kidney disease    Stage 4  . Kidney disease, chronic, stage IV (severe, EGFR 15-29 ml/min) (HCC)    Family History  Problem Relation Age of Onset  . Diabetes Mother   . Hyperlipidemia Mother   . Hyperlipidemia Father   . Hypertension Father   . Stroke Brother   . Diabetes Brother    Past Surgical History:  Procedure Laterality Date  . CESAREAN SECTION    . COLONOSCOPY N/A 01/12/2014   Procedure: COLONOSCOPY;  Surgeon: Rogene Houston, MD;  Location: [facility];  Service: Endoscopy;  Laterality: N/A;  225  . TUBAL LIGATION      Short Social History:  Social History   Tobacco Use  . Smoking status: Never Smoker  . Smokeless tobacco: Never Used  Substance Use Topics  . Alcohol use: No    Allergies  Allergen Reactions  . Nutritional Supplements     walnut  . Latex     Current Outpatient Medications  Medication Sig Dispense Refill  . acetaminophen (TYLENOL) 500 MG tablet Take 2 tablets (1,000 mg total) by mouth every 6 (six) hours as needed. 30 tablet 0  . albuterol (PROVENTIL HFA;VENTOLIN HFA) 108 (90 Base) MCG/ACT inhaler Inhale 2 puffs into the lungs every 6 (six) hours as needed for wheezing or shortness of breath. 1 Inhaler 11  . BIDIL 20-37.5 MG tablet Take 1 tablet by mouth 3 (three)  times daily.    . Blood Glucose Monitoring Suppl (TRUE METRIX AIR GLUCOSE METER) w/Device KIT 1 Device by Does not apply route 2 (two) times daily at 8 am and 10 pm. 1 kit 0  . glucose blood (TRUE METRIX BLOOD GLUCOSE TEST) test strip Use as instructed to check blood sugars twice per day 100 each 6  . TRUEPLUS LANCETS 28G MISC Use to check blood sugar twice per day 100 each 6  . amLODipine (NORVASC) 5 MG tablet Take 1 tablet (5 mg total) by mouth daily for 30 days. 30 tablet 0  . atorvastatin (LIPITOR) 40 MG tablet Take 1 tablet (40 mg total) by mouth daily at 6 PM for 30 days. 30 tablet 0  . colchicine 0.6 MG tablet Take 0.5 tablets (0.3 mg total) by mouth daily for 30 days. 15 tablet 0  . metoprolol tartrate (LOPRESSOR) 25 MG tablet Take 25 mg by mouth 2 (two) times daily.     No current facility-administered medications for this visit.     Review of Systems  Constitutional:  Constitutional negative. HENT: HENT negative.  Eyes: Eyes negative.  GI: Gastrointestinal negative.  Musculoskeletal: Positive for leg pain.  Skin: Skin negative.  Neurological: Neurological negative. Hematologic: Hematologic/lymphatic negative.  Psychiatric: Psychiatric negative.  Objective:  Objective   Vitals:   10/08/18 0857  BP: (!) 178/90  Pulse: 72  Resp: 20  Temp: (!) 97 F (36.1 C)  SpO2: 97%  Weight: 239 lb 11.2 oz (108.7 kg)  Height: '5\' 2"'  (1.575 m)   Body mass index is 43.84 kg/m.  Physical Exam HENT:     Head: Normocephalic.  Eyes:     Pupils: Pupils are equal, round, and reactive to light.  Cardiovascular:     Rate and Rhythm: Normal rate.     Pulses:          Radial pulses are 2+ on the right side and 2+ on the left side.       Posterior tibial pulses are 1+ on the right side and 1+ on the left side.  Pulmonary:     Effort: Pulmonary effort is normal.  Abdominal:     General: Abdomen is flat.     Palpations: Abdomen is soft.  Musculoskeletal:        General: No  swelling.  Skin:    General: Skin is warm and dry.     Capillary Refill: Capillary refill takes less than 2 seconds.  Neurological:     General: No focal deficit present.     Mental Status: She is alert.  Psychiatric:        Mood and Affect: Mood normal.        Behavior: Behavior normal.        Thought Content: Thought content normal.        Judgment: Judgment normal.     Data: I independently interpreted her bilateral upper extremity arterial duplexes to be 0.47 diameter on the right at the brachial artery and triphasic and on the left brachial artery 0.51 cm and triphasic.  I also independently interpreted her bilateral upper extremity vein mapping which demonstrates possible suitable vein for dialysis access creation in her bilateral upper extremities both cephalic and basilic vein above the antecubitum.     Assessment/Plan:     58 year old right-handed dominant female here for evaluation dialysis access.  Has never been on dialysis.  Appears to have suitable vein particularly in her nondominant left arm for access.  We discussed the risk and benefits as well as need for further procedures which would possibly include second stage for her basilic vein and possibly also superficialization versus transposition of the cephalic vein given the size of her upper arm on the left.  She demonstrates good understanding we will get her scheduled for the very near future.     Waynetta Sandy MD [facility]

## 2018-10-11 MED FILL — METOPROLOL SUCCINATE ER 100: 100 | 30 days supply | Qty: 30 | Fill #0

## 2018-10-12 ENCOUNTER — Other Ambulatory Visit: Payer: Self-pay

## 2018-10-14 ENCOUNTER — Encounter (HOSPITAL_COMMUNITY): Payer: Self-pay | Admitting: *Deleted

## 2018-10-14 ENCOUNTER — Other Ambulatory Visit (HOSPITAL_COMMUNITY): Payer: Medicaid Other

## 2018-10-18 ENCOUNTER — Encounter (HOSPITAL_COMMUNITY): Payer: Self-pay | Admitting: *Deleted

## 2018-10-18 ENCOUNTER — Other Ambulatory Visit: Payer: Self-pay

## 2018-10-18 ENCOUNTER — Other Ambulatory Visit (HOSPITAL_COMMUNITY)
Admission: RE | Admit: 2018-10-18 | Discharge: 2018-10-18 | Disposition: A | Discharge status: Home / Self Care | Payer: Medicaid Other | Source: Ambulatory Visit | Attending: Vascular Surgery | Admitting: Vascular Surgery

## 2018-10-18 ENCOUNTER — Other Ambulatory Visit (HOSPITAL_COMMUNITY): Payer: Medicaid Other

## 2018-10-18 DIAGNOSIS — I351 Nonrheumatic aortic (valve) insufficiency: Secondary | ICD-10-CM | POA: Diagnosis not present

## 2018-10-18 DIAGNOSIS — F419 Anxiety disorder, unspecified: Secondary | ICD-10-CM | POA: Diagnosis not present

## 2018-10-18 DIAGNOSIS — E1122 Type 2 diabetes mellitus with diabetic chronic kidney disease: Secondary | ICD-10-CM | POA: Diagnosis not present

## 2018-10-18 DIAGNOSIS — M1990 Unspecified osteoarthritis, unspecified site: Secondary | ICD-10-CM | POA: Diagnosis not present

## 2018-10-18 DIAGNOSIS — E669 Obesity, unspecified: Secondary | ICD-10-CM | POA: Diagnosis not present

## 2018-10-18 DIAGNOSIS — I120 Hypertensive chronic kidney disease with stage 5 chronic kidney disease or end stage renal disease: Secondary | ICD-10-CM | POA: Diagnosis present

## 2018-10-18 DIAGNOSIS — N185 Chronic kidney disease, stage 5: Secondary | ICD-10-CM | POA: Diagnosis not present

## 2018-10-18 DIAGNOSIS — J45909 Unspecified asthma, uncomplicated: Secondary | ICD-10-CM | POA: Diagnosis not present

## 2018-10-18 DIAGNOSIS — R001 Bradycardia, unspecified: Secondary | ICD-10-CM | POA: Diagnosis not present

## 2018-10-18 DIAGNOSIS — Z79899 Other long term (current) drug therapy: Secondary | ICD-10-CM | POA: Diagnosis not present

## 2018-10-18 DIAGNOSIS — Z6841 Body Mass Index (BMI) 40.0 and over, adult: Secondary | ICD-10-CM | POA: Diagnosis not present

## 2018-10-18 DIAGNOSIS — G4733 Obstructive sleep apnea (adult) (pediatric): Secondary | ICD-10-CM | POA: Diagnosis not present

## 2018-10-18 DIAGNOSIS — E785 Hyperlipidemia, unspecified: Secondary | ICD-10-CM | POA: Diagnosis not present

## 2018-10-18 DIAGNOSIS — Q256 Stenosis of pulmonary artery: Secondary | ICD-10-CM | POA: Diagnosis not present

## 2018-10-18 DIAGNOSIS — Z1159 Encounter for screening for other viral diseases: Secondary | ICD-10-CM | POA: Diagnosis not present

## 2018-10-18 NOTE — Progress Notes (Signed)
Spoke with pt for pre-op call. Pt recently admitted to hospital in May for severe bradycardia. Had Echo done and was told to follow up with Dr. [name] in June. Pt states she has seen Dr. [name] and was put on Bidil. Have requested last office visit notes from Dr. [name]. Pt states she is a type 2 diabetic that is diet controlled her last A1C was ?6.1 in June. Pt states she has not been checking her blood sugar because she plans to get the meter that she doesn't have to stick herself. Instructed her to check her blood in sugar in the AM when she gets up. If blood sugar is 70 or below, treat with 1/2 cup of clear juice (apple or cranberry) and recheck blood sugar 15 minutes after drinking juice. Pt voiced understanding.   Pt has been today to be tested for Covid. Pt understands the need for quarantine.    Coronavirus Screening  Have you experienced the following symptoms:  Cough NO Fever (>100.5F) NO  Runny nose NO Sore throat NO Difficulty breathing/shortness of breath  NO  Have you or a family member traveled in the last 14 days and where? NO   Patient reminded that hospital visitation restrictions are in effect and the importance of the restrictions.

## 2018-10-18 NOTE — Anesthesia Preprocedure Evaluation (Addendum)
Anesthesia Evaluation  Patient identified by MRN, date of birth, ID band Patient awake    Reviewed: Allergy & Precautions, H&P , NPO status , Patient's Chart, lab work & pertinent test results, reviewed documented beta blocker date and time   History of Anesthesia Complications Negative for: history of anesthetic complications  Airway Mallampati: II  TM Distance: >3 FB Neck ROM: Full    Dental  (+) Poor Dentition, Partial Upper, Dental Advisory Given   Pulmonary neg pulmonary ROS, asthma , sleep apnea ,    Pulmonary exam normal        Cardiovascular Exercise Tolerance: Good hypertension, Pt. on medications negative cardio ROS Normal cardiovascular exam     Neuro/Psych Anxiety negative neurological ROS  negative psych ROS   GI/Hepatic negative GI ROS, Neg liver ROS, GERD  ,  Endo/Other  diabetesMorbid obesity  Renal/GU ESRFRenal diseasenegative Renal ROS  negative genitourinary   Musculoskeletal  (+) Arthritis , Osteoarthritis,    Abdominal   Peds  Hematology negative hematology ROS (+) Blood dyscrasia, anemia ,   Anesthesia Other Findings   Reproductive/Obstetrics negative OB ROS                           Anesthesia Physical Anesthesia Plan  ASA: III  Anesthesia Plan: General   Post-op Pain Management:    Induction: Intravenous  PONV Risk Score and Plan: 3 and Ondansetron, Dexamethasone and Diphenhydramine  Airway Management Planned: Simple Face Mask  Additional Equipment:   Intra-op Plan:   Post-operative Plan:   Informed Consent: I have reviewed the patients History and Physical, chart, labs and discussed the procedure including the risks, benefits and alternatives for the proposed anesthesia with the patient or authorized representative who has indicated his/her understanding and acceptance.     Dental advisory given  Plan Discussed with: CRNA  Anesthesia Plan  Comments: (PAT note written 10/18/2018 by Myra Gianotti, PA-C. SAME DAY WORK-UP. Metoprolol discontinued in May due to significant bradycardia. Cardiologist is Dr. [name].   )      Anesthesia Quick Evaluation

## 2018-10-18 NOTE — Progress Notes (Signed)
Anesthesia Chart Review: Margaret Aguilar   Case: [id number] Date/Time: 10/19/18 0715   Procedure: LEFT ARTERIOVENOUS (AV) FISTULA CREATION (Left )   Anesthesia type: Monitor Anesthesia Care   Pre-op diagnosis: CHRONIC KIDNEY DISEASE STAGE V   Location: [place] [facility] ROOM 12 / [place] [facility]   Surgeon: Waynetta Sandy, MD      DISCUSSION: Patient is a 58 year old female scheduled for the above procedure.   History includes never smoker, DM2, HTN , asthma, bradycardia (improved off b-blocker), CKD stage IV, anemia.  - Admitted 08/16/18-08/17/18 for bradycardia. Vitals by HHPT showed a HR in the 30's, and patient had already taken metoprolol that morning. HR 33-38 bpm in ED, SBP 140-180's. Cardiology consulted, seen by Dorris Carnes, MD, [place] primary cardiologist is [name], MD. Metoprolol was discontinued and amlodipine added with out-patient follow-up with Dr. [name]. (HR 78 bpm at 09/27/18 follow-up.)  - Admitted 08/08/18-08/11/18 for gouty arthritis involving the left ankle and AKI/CKD stage IV (presumed ATN). Nephrology consulted. acute on chronic kidney disease. Renal US with increased echogenicity and without obstruction.  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 ACE-I and HCTZ placed on hold. Out-patient nephrology follow-up recommended.   10/18/18 presurgical COVID test is still in process. She is for labs and anesthesia team evaluation on the day of surgery.   PROVIDERSCharolette Forward, MD is PCP/cardiologist. Last visit 09/27/18.  No new testing ordered. Follow-up 3 months.  Donato Heinz, MD is nephrologist.   LABS: She is for labs on arrival.    EKG: 08/17/18: Marked SB at 38 BPM. Possible LAE. (Metoprolol has since been discontinued.)   CV: Echo 08/17/18: IMPRESSIONS  1. The left ventricle has normal systolic function, with an ejection fraction of 60-65%. The cavity size was normal. There is mildly increased  left ventricular wall thickness. Left ventricular diastolic parameters were normal.  2. The right ventricle has normal systolc function. The cavity was normal. There is no increase in right ventricular wall thickness.  3. Left atrial size was moderately dilated.  4. Mild thickening of the mitral valve leaflet.  5. The aortic valve is tricuspid Mild thickening of the aortic valve Mild calcification of the aortic valve. Aortic valve regurgitation is mild by color flow Doppler.  6. Mild pulmonic stenosis.  7. The aortic root is normal in size and structure.  Cardiac cath 11/17/02 (following abnormal stress test):  FINDINGS:  LV showed good LV systolic function, LVH, EF of 60-65%.  Left  main was patent.  LAD has 10% ostial stenosis and 10-20% mid and distal  stenosis.  Diagonal 1 is large which is patent.  Left circumflex is large  which is patent.  OM 1 is very, very small which is less than 1 mm.  OM 2 is  large which is patent which gives off one branch in the mid portion,  therefore it bifurcates into superior and inferior branch.  RCA is small  which is nondominant which is patent.   Past Medical History:  Diagnosis Date  . Anemia   . Anxiety   . Arthritis   . Asthma   . Bradycardia   . Diabetes mellitus without complication ([place])    x 5 yrs  . Herpes   . Hypertension   . Kidney disease    Stage 4  . 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 Pneumonia   . Sleep apnea  hasn't  gotten cpap yet    Past Surgical History:  Procedure Laterality Date  . CESAREAN SECTION    . COLONOSCOPY N/A 01/12/2014   Procedure: COLONOSCOPY;  Surgeon: Rogene Houston, MD;  [facility];  Service: Endoscopy;  Laterality: N/A;  225  . TUBAL LIGATION      MEDICATIONS: No current facility-administered medications for this encounter.    [name] acetaminophen (TYLENOL) 500 MG tablet  . albuterol (PROVENTIL HFA;VENTOLIN HFA) 108 (90 Base) MCG/ACT inhaler  .  amLODipine (NORVASC) 5 MG tablet  . atorvastatin (LIPITOR) 40 MG tablet  . BIDIL 20-37.5 MG tablet  . Blood Glucose Monitoring Suppl (TRUE METRIX AIR GLUCOSE METER) w/Device KIT  . colchicine 0.6 MG tablet  . glucose blood (TRUE METRIX BLOOD GLUCOSE TEST) test strip  . metoprolol tartrate (LOPRESSOR) 25 MG tablet  . TRUEPLUS LANCETS 28G MISC  She is no longer on metoprolol.   Myra Gianotti, PA-C Surgical Short Stay/Anesthesiology [facility] Phone [phone] [facility] Phone [phone] 10/18/2018 4:21 PM

## 2018-10-19 ENCOUNTER — Ambulatory Visit (HOSPITAL_COMMUNITY): Payer: Medicaid Other | Admitting: Certified Registered"

## 2018-10-19 ENCOUNTER — Encounter (HOSPITAL_COMMUNITY)
Admission: RE | Disposition: A | Discharge status: Home / Self Care | Payer: Self-pay | Source: Home / Self Care | Attending: Vascular Surgery

## 2018-10-19 ENCOUNTER — Ambulatory Visit (HOSPITAL_COMMUNITY)
Admission: RE | Admit: 2018-10-19 | Discharge: 2018-10-19 | Disposition: A | Discharge status: Home / Self Care | Payer: Medicaid Other | Attending: Vascular Surgery | Admitting: Vascular Surgery

## 2018-10-19 ENCOUNTER — Encounter (HOSPITAL_COMMUNITY): Payer: Self-pay | Admitting: Anesthesiology

## 2018-10-19 DIAGNOSIS — J45909 Unspecified asthma, uncomplicated: Secondary | ICD-10-CM | POA: Diagnosis not present

## 2018-10-19 DIAGNOSIS — I351 Nonrheumatic aortic (valve) insufficiency: Secondary | ICD-10-CM | POA: Insufficient documentation

## 2018-10-19 DIAGNOSIS — Q256 Stenosis of pulmonary artery: Secondary | ICD-10-CM | POA: Insufficient documentation

## 2018-10-19 DIAGNOSIS — M1990 Unspecified osteoarthritis, unspecified site: Secondary | ICD-10-CM | POA: Insufficient documentation

## 2018-10-19 DIAGNOSIS — Z79899 Other long term (current) drug therapy: Secondary | ICD-10-CM | POA: Insufficient documentation

## 2018-10-19 DIAGNOSIS — E785 Hyperlipidemia, unspecified: Secondary | ICD-10-CM | POA: Diagnosis not present

## 2018-10-19 DIAGNOSIS — E1122 Type 2 diabetes mellitus with diabetic chronic kidney disease: Secondary | ICD-10-CM | POA: Diagnosis not present

## 2018-10-19 DIAGNOSIS — E669 Obesity, unspecified: Secondary | ICD-10-CM | POA: Insufficient documentation

## 2018-10-19 DIAGNOSIS — I120 Hypertensive chronic kidney disease with stage 5 chronic kidney disease or end stage renal disease: Secondary | ICD-10-CM | POA: Diagnosis not present

## 2018-10-19 DIAGNOSIS — Z6841 Body Mass Index (BMI) 40.0 and over, adult: Secondary | ICD-10-CM | POA: Insufficient documentation

## 2018-10-19 DIAGNOSIS — G4733 Obstructive sleep apnea (adult) (pediatric): Secondary | ICD-10-CM | POA: Insufficient documentation

## 2018-10-19 DIAGNOSIS — N184 Chronic kidney disease, stage 4 (severe): Secondary | ICD-10-CM

## 2018-10-19 DIAGNOSIS — Z1159 Encounter for screening for other viral diseases: Secondary | ICD-10-CM | POA: Insufficient documentation

## 2018-10-19 DIAGNOSIS — R001 Bradycardia, unspecified: Secondary | ICD-10-CM | POA: Diagnosis not present

## 2018-10-19 DIAGNOSIS — N185 Chronic kidney disease, stage 5: Secondary | ICD-10-CM | POA: Diagnosis not present

## 2018-10-19 DIAGNOSIS — F419 Anxiety disorder, unspecified: Secondary | ICD-10-CM | POA: Diagnosis not present

## 2018-10-19 DIAGNOSIS — N186 End stage renal disease: Secondary | ICD-10-CM | POA: Diagnosis not present

## 2018-10-19 HISTORY — DX: Pneumonia, unspecified organism: J18.9

## 2018-10-19 HISTORY — DX: Bradycardia, unspecified: R00.1

## 2018-10-19 HISTORY — DX: Sleep apnea, unspecified: G47.30

## 2018-10-19 HISTORY — DX: Anxiety disorder, unspecified: F41.9

## 2018-10-19 HISTORY — PX: AV FISTULA PLACEMENT: SHX1204

## 2018-10-19 HISTORY — DX: Unspecified asthma, uncomplicated: J45.909

## 2018-10-19 HISTORY — DX: Anemia, unspecified: D64.9

## 2018-10-19 LAB — POCT I-STAT 4, (NA,K, GLUC, HGB,HCT)
Glucose, Bld: 143 mg/dL — ABNORMAL HIGH (ref 70–99)
HCT: 42 % (ref 36.0–46.0)
Hemoglobin: 14.3 g/dL (ref 12.0–15.0)
Potassium: 3.8 mmol/L (ref 3.5–5.1)
Sodium: 142 mmol/L (ref 135–145)

## 2018-10-19 LAB — GLUCOSE, CAPILLARY
Glucose-Capillary: 118 mg/dL — ABNORMAL HIGH (ref 70–99)
Glucose-Capillary: 127 mg/dL — ABNORMAL HIGH (ref 70–99)

## 2018-10-19 LAB — SARS CORONAVIRUS 2 (TAT 6-24 HRS): SARS Coronavirus 2: NEGATIVE

## 2018-10-19 SURGERY — ARTERIOVENOUS (AV) FISTULA CREATION
Anesthesia: General | Laterality: Left

## 2018-10-19 MED ORDER — PROPOFOL 10 MG/ML IV BOLUS
INTRAVENOUS | Status: AC
  Filled 2018-10-19: qty 20

## 2018-10-19 MED ORDER — FENTANYL CITRATE (PF) 100 MCG/2ML IJ SOLN
INTRAMUSCULAR | Status: DC | PRN
  Administered 2018-10-19: 50 ug via INTRAVENOUS
  Administered 2018-10-19: 25 ug via INTRAVENOUS
  Administered 2018-10-19: 75 ug via INTRAVENOUS

## 2018-10-19 MED ORDER — SODIUM CHLORIDE 0.9 % IV SOLN
INTRAVENOUS | Status: DC | PRN
  Administered 2018-10-19: 500 mL

## 2018-10-19 MED ORDER — MIDAZOLAM HCL 5 MG/5ML IJ SOLN
INTRAMUSCULAR | Status: DC | PRN
  Administered 2018-10-19 (×2): 1 mg via INTRAVENOUS

## 2018-10-19 MED ORDER — EPHEDRINE SULFATE-NACL 50-0.9 MG/10ML-% IV SOSY
PREFILLED_SYRINGE | INTRAVENOUS | Status: DC | PRN
  Administered 2018-10-19: 5 mg via INTRAVENOUS
  Administered 2018-10-19: 10 mg via INTRAVENOUS

## 2018-10-19 MED ORDER — CEFAZOLIN SODIUM-DEXTROSE 2-4 GM/100ML-% IV SOLN
INTRAVENOUS | Status: AC
  Filled 2018-10-19: qty 100

## 2018-10-19 MED ORDER — FENTANYL CITRATE (PF) 250 MCG/5ML IJ SOLN
INTRAMUSCULAR | Status: AC
  Filled 2018-10-19: qty 5

## 2018-10-19 MED ORDER — 0.9 % SODIUM CHLORIDE (POUR BTL) OPTIME
TOPICAL | Status: DC | PRN
  Administered 2018-10-19: 1000 mL

## 2018-10-19 MED ORDER — ONDANSETRON HCL 4 MG/2ML IJ SOLN
INTRAMUSCULAR | Status: DC | PRN
  Administered 2018-10-19: 4 mg via INTRAVENOUS

## 2018-10-19 MED ORDER — HYDROMORPHONE HCL 1 MG/ML IJ SOLN: 0.2500 mg | INTRAMUSCULAR | Status: DC | PRN

## 2018-10-19 MED ORDER — CHLORHEXIDINE GLUCONATE 4 % EX LIQD: 60.0000 mL | Freq: Once | CUTANEOUS | Status: DC

## 2018-10-19 MED ORDER — CEFAZOLIN SODIUM-DEXTROSE 2-4 GM/100ML-% IV SOLN
2.0000 g | INTRAVENOUS | Status: AC
  Administered 2018-10-19: 2 g via INTRAVENOUS

## 2018-10-19 MED ORDER — DEXAMETHASONE SODIUM PHOSPHATE 10 MG/ML IJ SOLN
INTRAMUSCULAR | Status: DC | PRN
  Administered 2018-10-19: 8 mg via INTRAVENOUS

## 2018-10-19 MED ORDER — PHENYLEPHRINE 40 MCG/ML (10ML) SYRINGE FOR IV PUSH (FOR BLOOD PRESSURE SUPPORT)
PREFILLED_SYRINGE | INTRAVENOUS | Status: DC | PRN
  Administered 2018-10-19 (×2): 80 ug via INTRAVENOUS

## 2018-10-19 MED ORDER — SODIUM CHLORIDE 0.9 % IV SOLN
INTRAVENOUS | Status: AC
  Filled 2018-10-19: qty 1.2

## 2018-10-19 MED ORDER — SODIUM CHLORIDE 0.9 % IV SOLN
INTRAVENOUS | Status: DC | PRN
  Administered 2018-10-19: 20 ug/min via INTRAVENOUS

## 2018-10-19 MED ORDER — LIDOCAINE-EPINEPHRINE (PF) 1 %-1:200000 IJ SOLN
INTRAMUSCULAR | Status: AC
  Filled 2018-10-19: qty 30

## 2018-10-19 MED ORDER — ACETAMINOPHEN 500 MG PO TABS
1000.0000 mg | ORAL_TABLET | Freq: Once | ORAL | Status: AC
  Administered 2018-10-19: 1000 mg via ORAL
  Filled 2018-10-19: qty 2

## 2018-10-19 MED ORDER — PROPOFOL 10 MG/ML IV BOLUS
INTRAVENOUS | Status: DC | PRN
  Administered 2018-10-19: 150 mg via INTRAVENOUS

## 2018-10-19 MED ORDER — OXYCODONE-ACETAMINOPHEN 5-325 MG PO TABS: 1.0000 | ORAL_TABLET | Freq: Four times a day (QID) | ORAL | 0 refills | Status: DC | PRN

## 2018-10-19 MED ORDER — MIDAZOLAM HCL 2 MG/2ML IJ SOLN
INTRAMUSCULAR | Status: AC
  Filled 2018-10-19: qty 2

## 2018-10-19 MED ORDER — LIDOCAINE-EPINEPHRINE (PF) 1 %-1:200000 IJ SOLN
INTRAMUSCULAR | Status: DC | PRN
  Administered 2018-10-19: 30 mL

## 2018-10-19 MED ORDER — SODIUM CHLORIDE 0.9 % IV SOLN
INTRAVENOUS | Status: DC
  Administered 2018-10-19: 08:00:00 via INTRAVENOUS

## 2018-10-19 MED ORDER — LIDOCAINE 2% (20 MG/ML) 5 ML SYRINGE
INTRAMUSCULAR | Status: DC | PRN
  Administered 2018-10-19: 100 mg via INTRAVENOUS

## 2018-10-19 SURGICAL SUPPLY — 43 items
ADH SKN CLS APL DERMABOND .7 (GAUZE/BANDAGES/DRESSINGS) ×1
ARMBAND PINK RESTRICT EXTREMIT (MISCELLANEOUS) ×3 IMPLANT
CANISTER SUCT 3000ML PPV (MISCELLANEOUS) ×3 IMPLANT
CLIP VESOCCLUDE MED 6/CT (CLIP) ×3 IMPLANT
CLIP VESOCCLUDE SM WIDE 6/CT (CLIP) ×5 IMPLANT
COVER PROBE W GEL 5X96 (DRAPES) IMPLANT
COVER WAND RF STERILE (DRAPES) ×3 IMPLANT
DERMABOND ADVANCED (GAUZE/BANDAGES/DRESSINGS) ×2
DERMABOND ADVANCED .7 DNX12 (GAUZE/BANDAGES/DRESSINGS) ×1 IMPLANT
ELECT REM PT RETURN 9FT ADLT (ELECTROSURGICAL) ×3
ELECTRODE REM PT RTRN 9FT ADLT (ELECTROSURGICAL) ×1 IMPLANT
GLOVE BIO SURGEON STRL SZ7.5 (GLOVE) ×1 IMPLANT
GLOVE BIOGEL PI IND STRL 6.5 (GLOVE) IMPLANT
GLOVE BIOGEL PI IND STRL 7.0 (GLOVE) IMPLANT
GLOVE BIOGEL PI IND STRL 7.5 (GLOVE) IMPLANT
GLOVE BIOGEL PI INDICATOR 6.5 (GLOVE) ×4
GLOVE BIOGEL PI INDICATOR 7.0 (GLOVE) ×4
GLOVE BIOGEL PI INDICATOR 7.5 (GLOVE) ×2
GLOVE NEODERM STER SZ 7 (GLOVE) ×2 IMPLANT
GLOVE SKINSENSE NS SZ6.5 (GLOVE) ×4
GLOVE SKINSENSE NS SZ7.5 (GLOVE) ×2
GLOVE SKINSENSE STRL SZ6.5 (GLOVE) IMPLANT
GLOVE SKINSENSE STRL SZ7.5 (GLOVE) IMPLANT
GLOVE SURG SYN 7.5  E (GLOVE) ×2
GLOVE SURG SYN 7.5 E (GLOVE) ×1 IMPLANT
GLOVE SURG SYN 7.5 PF PI (GLOVE) IMPLANT
GOWN STRL REUS W/ TWL LRG LVL3 (GOWN DISPOSABLE) ×2 IMPLANT
GOWN STRL REUS W/ TWL XL LVL3 (GOWN DISPOSABLE) ×1 IMPLANT
GOWN STRL REUS W/TWL LRG LVL3 (GOWN DISPOSABLE) ×6
GOWN STRL REUS W/TWL XL LVL3 (GOWN DISPOSABLE) ×3
INSERT FOGARTY SM (MISCELLANEOUS) IMPLANT
KIT BASIN OR (CUSTOM PROCEDURE TRAY) ×3 IMPLANT
KIT TURNOVER KIT B (KITS) ×3 IMPLANT
NS IRRIG 1000ML POUR BTL (IV SOLUTION) ×3 IMPLANT
PACK CV ACCESS (CUSTOM PROCEDURE TRAY) ×3 IMPLANT
PAD ARMBOARD 7.5X6 YLW CONV (MISCELLANEOUS) ×6 IMPLANT
SUT MNCRL AB 4-0 PS2 18 (SUTURE) ×3 IMPLANT
SUT PROLENE 6 0 BV (SUTURE) ×3 IMPLANT
SUT VIC AB 3-0 SH 27 (SUTURE) ×3
SUT VIC AB 3-0 SH 27X BRD (SUTURE) ×1 IMPLANT
TOWEL GREEN STERILE (TOWEL DISPOSABLE) ×3 IMPLANT
UNDERPAD 30X30 (UNDERPADS AND DIAPERS) ×3 IMPLANT
WATER STERILE IRR 1000ML POUR (IV SOLUTION) ×3 IMPLANT

## 2018-10-19 NOTE — Discharge Instructions (Signed)
° °  [facility]  Discharge Instructions  AV Fistula or Graft Surgery for Dialysis Access  Please refer to the following instructions for your post-procedure care. Your surgeon or physician assistant will discuss any changes with you.  Activity  You may drive the day following your surgery, if you are comfortable and no longer taking prescription pain medication. Resume full activity as the soreness in your incision resolves.  Bathing/Showering  You may shower after you go home. Keep your incision dry for 48 hours. Do not soak in a bathtub, hot tub, or swim until the incision heals completely. You may not shower if you have a hemodialysis catheter.  Incision Care  Clean your incision with mild soap and water after 48 hours. Pat the area dry with a clean towel. You do not need a bandage unless otherwise instructed. Do not apply any ointments or creams to your incision. You may have skin glue on your incision. Do not peel it off. It will come off on its own in about one week. Your arm may swell a bit after surgery. To reduce swelling use pillows to elevate your arm so it is above your heart. Your doctor will tell you if you need to lightly wrap your arm with an ACE bandage.  Diet  Resume your normal diet. There are not special food restrictions following this procedure. In order to heal from your surgery, it is CRITICAL to get adequate nutrition. Your body requires vitamins, minerals, and protein. Vegetables are the best source of vitamins and minerals. Vegetables also provide the perfect balance of protein. Processed food has little nutritional value, so try to avoid this.  Medications  Resume taking all of your medications. If your incision is causing pain, you may take over-the counter pain relievers such as acetaminophen (Tylenol). If you were prescribed a stronger pain medication, please be aware these medications can cause nausea and constipation. Prevent  nausea by taking the medication with a snack or meal. Avoid constipation by drinking plenty of fluids and eating foods with high amount of fiber, such as fruits, vegetables, and grains.  Do not take Tylenol if you are taking prescription pain medications.  Follow up Your surgeon may want to see you in the office following your access surgery. If so, this will be arranged at the time of your surgery.  Please call us immediately for any of the following conditions:  Increased pain, redness, drainage (pus) from your incision site Fever of 101 degrees or higher Severe or worsening pain at your incision site Hand pain or numbness.  Reduce your risk of vascular disease:  Stop smoking. If you would like help, call [facility] at 1-800-QUIT-NOW (320)405-7181) or [place] at [place] your cholesterol Maintain a desired weight Control your diabetes Keep your blood pressure down  Dialysis  It will take several weeks to several months for your new dialysis access to be ready for use. Your surgeon will determine when it is okay to use it. Your nephrologist will continue to direct your dialysis. You can continue to use your Permcath until your new access is ready for use.   10/19/2018 Margaret Aguilar [id number] Feb 11, 1961  Surgeon(s): Waynetta Sandy, MD  Procedure(s): Creation of left brachiocephalic AV fistula  x Do not stick fistula for 12 weeks    If you have any questions, please call the office at [phone].

## 2018-10-19 NOTE — Op Note (Signed)
    Patient name: Margaret Aguilar MRN: [id number] DOB: 1960/06/17 Sex: female  10/19/2018 Pre-operative Diagnosis: End-stage renal disease Post-operative diagnosis:  Same Surgeon:  Erlene Quan C. Donzetta Matters, MD Assistant: Leontine Locket, PA Procedure Performed:  Left arm cephalic vein AV fistula creation  Indications: 58 year old female with chronic kidney disease.  She is in need of dialysis access appears to have suitable basilic and cephalic veins on the left and she is right-hand dominant.  Findings: Cephalic vein measure approximately 5 mm at the antecubitum.  There was minimal disease there.  At completion there was a thrill although was also minimal pulsatility but flow confirmed with Doppler and there was a palpable radial pulse the wrist.   Procedure:  The patient was identified in the holding area and taken to the operating room was placed by operative when general anesthesia induced.  She received appropriate left upper semi-usual fashion antibiotics administered timeout called.  We began with ultrasound identified a cephalic vein which was quite large throughout the upper arm.  We made a transverse incision below the antecubitum.  Dissected out the vein marked for orientation.  Dissected through the deep fascia identified the brachial artery placed a vessel loop around this.  The vein wa transected distally and clipped doubly.  We then spatulated the vein flushed with heparinized saline.  It was reclamped.  The artery was clamped this approximate opened longitudinally flushed with heparinized saline by directions.  The vein was sewn into seven 6-0 Prolene suture.  Prior to completion anastomosis without flushing all direction.  Bronchomalacia and there was good thrill with a little bit of pulsatility distally in the vein.  There is palpable radial pulse the wrist both confirmed with Doppler.  Satisfied with this we irrigated the wound obtain hemostasis closed in layers Vicryl Monocryl.  She was  awakened anesthesia having tolerated procedure without immediate complication.  All counts were correct at completion.  EBL: 20 cc   Nykeem Citro C. Donzetta Matters, MD [facility] [place] Office: [phone] Pager: [phone]

## 2018-10-19 NOTE — Transfer of Care (Signed)
Immediate Anesthesia Transfer of Care Note  Patient: Margaret Aguilar  Procedure(s) Performed: LEFT ARTERIOVENOUS (AV) FISTULA CREATION (Left )  Patient Location: PACU  Anesthesia Type:General  Level of Consciousness: awake, alert  and oriented  Airway & Oxygen Therapy: Patient Spontanous Breathing  Post-op Assessment: Report given to RN and Post -op Vital signs reviewed and stable  Post vital signs: Reviewed and stable  Last Vitals:  Vitals Value Taken Time  BP 156/80 10/19/18 1031  Temp    Pulse 49 10/19/18 1033  Resp 17 10/19/18 1033  SpO2 94 % 10/19/18 1033  Vitals shown include unvalidated device data.  Last Pain:  Vitals:   10/19/18 0657  TempSrc: Oral      Patients Stated Pain Goal: 3 (43/73/57 8978)  Complications: No apparent anesthesia complications

## 2018-10-19 NOTE — Anesthesia Postprocedure Evaluation (Signed)
Anesthesia Post Note  Patient: Margaret Aguilar  Procedure(s) Performed: LEFT ARTERIOVENOUS (AV) FISTULA CREATION (Left )     Patient location during evaluation: PACU Anesthesia Type: General Level of consciousness: sedated Pain management: pain level controlled Vital Signs Assessment: post-procedure vital signs reviewed and stable Respiratory status: spontaneous breathing and respiratory function stable Cardiovascular status: stable Postop Assessment: no apparent nausea or vomiting Anesthetic complications: no    Last Vitals:  Vitals:   10/19/18 1112 10/19/18 1127  BP: 136/63 130/77  Pulse: (!) 43 (!) 41  Resp: 19 13  Temp:    SpO2: 93% 96%    Last Pain:  Vitals:   10/19/18 1057  TempSrc:   PainSc: Asleep                 Aurea Aronov DANIEL

## 2018-10-19 NOTE — H&P (Signed)
   History and Physical Update  The patient was interviewed and re-examined.  The patient's previous History and Physical has been reviewed and is unchanged from recent office visit. Plan for left arm avf vs avg today in OR.   Margaret Aguilar C. Donzetta Matters, MD [facility] [place] Office: [phone] Pager: [phone]   10/19/2018, 7:25 AM

## 2018-10-19 NOTE — Progress Notes (Signed)
Pt states she is not currently using cpap

## 2018-10-19 NOTE — Anesthesia Procedure Notes (Signed)
Procedure Name: LMA Insertion Date/Time: 10/19/2018 9:30 AM Performed by: Marsa Aris, CRNA Pre-anesthesia Checklist: Patient identified, Emergency Drugs available, Suction available and Patient being monitored Patient Re-evaluated:Patient Re-evaluated prior to induction Oxygen Delivery Method: Circle System Utilized Preoxygenation: Pre-oxygenation with 100% oxygen Induction Type: IV induction Ventilation: Mask ventilation without difficulty LMA: LMA inserted LMA Size: 4.0 Number of attempts: 1 Airway Equipment and Method: Bite block Placement Confirmation: positive ETCO2 Tube secured with: Tape Dental Injury: Teeth and Oropharynx as per pre-operative assessment

## 2018-10-20 ENCOUNTER — Encounter (HOSPITAL_COMMUNITY): Payer: Self-pay | Admitting: Vascular Surgery

## 2018-10-20 MED FILL — METOPROLOL SUCCINATE ER 100: 100 | 30 days supply | Qty: 30 | Fill #0

## 2018-10-20 MED FILL — AMLODIPINE-BENAZEPRIL 10-20: 10-20 | 30 days supply | Qty: 30 | Fill #2

## 2018-11-05 ENCOUNTER — Other Ambulatory Visit: Payer: Self-pay | Admitting: Vascular Surgery

## 2018-11-05 DIAGNOSIS — N184 Chronic kidney disease, stage 4 (severe): Secondary | ICD-10-CM

## 2018-11-30 DIAGNOSIS — M1990 Unspecified osteoarthritis, unspecified site: Secondary | ICD-10-CM | POA: Diagnosis not present

## 2018-11-30 DIAGNOSIS — E785 Hyperlipidemia, unspecified: Secondary | ICD-10-CM | POA: Diagnosis not present

## 2018-11-30 DIAGNOSIS — N2581 Secondary hyperparathyroidism of renal origin: Secondary | ICD-10-CM | POA: Diagnosis not present

## 2018-11-30 DIAGNOSIS — I129 Hypertensive chronic kidney disease with stage 1 through stage 4 chronic kidney disease, or unspecified chronic kidney disease: Secondary | ICD-10-CM | POA: Diagnosis not present

## 2018-11-30 DIAGNOSIS — I770 Arteriovenous fistula, acquired: Secondary | ICD-10-CM | POA: Diagnosis not present

## 2018-11-30 DIAGNOSIS — E1129 Type 2 diabetes mellitus with other diabetic kidney complication: Secondary | ICD-10-CM | POA: Diagnosis not present

## 2018-11-30 DIAGNOSIS — M109 Gout, unspecified: Secondary | ICD-10-CM | POA: Diagnosis not present

## 2018-11-30 DIAGNOSIS — N184 Chronic kidney disease, stage 4 (severe): Secondary | ICD-10-CM | POA: Diagnosis not present

## 2018-11-30 DIAGNOSIS — T50905A Adverse effect of unspecified drugs, medicaments and biological substances, initial encounter: Secondary | ICD-10-CM | POA: Diagnosis not present

## 2018-11-30 DIAGNOSIS — Z1159 Encounter for screening for other viral diseases: Secondary | ICD-10-CM | POA: Diagnosis not present

## 2018-11-30 DIAGNOSIS — R001 Bradycardia, unspecified: Secondary | ICD-10-CM | POA: Diagnosis not present

## 2018-11-30 DIAGNOSIS — D631 Anemia in chronic kidney disease: Secondary | ICD-10-CM | POA: Diagnosis not present

## 2018-11-30 DIAGNOSIS — N189 Chronic kidney disease, unspecified: Secondary | ICD-10-CM | POA: Diagnosis not present

## 2018-12-03 ENCOUNTER — Ambulatory Visit (HOSPITAL_COMMUNITY): Payer: Medicaid Other

## 2018-12-10 ENCOUNTER — Encounter (HOSPITAL_COMMUNITY): Payer: Medicaid Other

## 2018-12-23 ENCOUNTER — Telehealth (HOSPITAL_COMMUNITY): Payer: Self-pay | Admitting: *Deleted

## 2018-12-23 DIAGNOSIS — N184 Chronic kidney disease, stage 4 (severe): Secondary | ICD-10-CM | POA: Diagnosis not present

## 2018-12-23 DIAGNOSIS — I129 Hypertensive chronic kidney disease with stage 1 through stage 4 chronic kidney disease, or unspecified chronic kidney disease: Secondary | ICD-10-CM | POA: Diagnosis not present

## 2018-12-23 NOTE — Telephone Encounter (Signed)
The above patient or their representative was contacted and gave the following answers to these questions:         Do you have any of the following symptoms?n Fever                    Cough                   Shortness of breath  Do  you have any of the following other symptoms? n   muscle pain         vomiting,        diarrhea        rash         weakness        red eye        abdominal pain         bruising          bruising or bleeding              joint pain           severe headache    Have you been in contact with someone who was or has been sick in the past 2 weeks?n  Yes                 Unsure                         Unable to assess   Does the person that you were in contact with have any of the following symptoms? n Cough         shortness of breath           muscle pain         vomiting,            diarrhea            rash            weakness           fever            red eye           abdominal pain           bruising  or  bleeding                joint pain                severe headache               Have you  or someone you have been in contact with traveled internationally in th last month?         If yes, which countries?   Have you  or someone you have been in contact with traveled outside [place] in th last month?         If yes, which state and city?   COMMENTS OR ACTION PLAN FOR THIS PATIENT:          

## 2018-12-24 ENCOUNTER — Ambulatory Visit (HOSPITAL_COMMUNITY)
Admission: RE | Admit: 2018-12-24 | Discharge: 2018-12-24 | Disposition: A | Discharge status: Home / Self Care | Payer: Medicaid Other | Source: Ambulatory Visit | Attending: Vascular Surgery | Admitting: Vascular Surgery

## 2018-12-24 ENCOUNTER — Ambulatory Visit (INDEPENDENT_AMBULATORY_CARE_PROVIDER_SITE_OTHER): Payer: Self-pay | Admitting: Family

## 2018-12-24 ENCOUNTER — Encounter: Payer: Self-pay | Admitting: Family

## 2018-12-24 ENCOUNTER — Other Ambulatory Visit: Payer: Self-pay

## 2018-12-24 VITALS — BP 132/70 | HR 84 | Temp 97.9°F | Resp 14 | Ht 66.0 in | Wt 238.2 lb

## 2018-12-24 DIAGNOSIS — N184 Chronic kidney disease, stage 4 (severe): Secondary | ICD-10-CM | POA: Diagnosis not present

## 2018-12-24 DIAGNOSIS — I770 Arteriovenous fistula, acquired: Secondary | ICD-10-CM

## 2018-12-24 NOTE — Progress Notes (Signed)
CC: 6 week follow up AVF duplex  History of Present Illness  Margaret Aguilar is a 58 y.o. (27-May-1960) female who is s/p Left arm cephalic vein AV fistula creation on 10-19-18 by Dr. Donzetta Matters.   She returns today for 6 weeks duplex follow up of AVF.   She reports mild occasional tingling and numbness in her left hand. She is right hand dominant. She has not yet started hemodialysis, and states that her nephrologist has not indicated that she needs to start any time soon.   She states that Dr. [name] is aware of her irregular heart rhythm and occasional bradycardia.    Past Medical History:  Diagnosis Date  . Anemia   . Anxiety   . Arthritis   . Asthma   . Bradycardia   . Diabetes mellitus without complication ([place])    x 5 yrs  . Herpes   . Hypertension   . Kidney disease    Stage 4  . 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 Pneumonia   . Sleep apnea    hasn't  gotten cpap yet    Social History Social History   Tobacco Use  . Smoking status: Never Smoker  . Smokeless tobacco: Never Used  Substance Use Topics  . Alcohol use: No  . Drug use: Yes    Types: Marijuana    Comment: occasional     Family History Family History  Problem Relation Age of Onset  . Diabetes Mother   . Hyperlipidemia Mother   . Hyperlipidemia Father   . Hypertension Father   . Stroke Brother   . Diabetes Brother     Surgical History Past Surgical History:  Procedure Laterality Date  . AV FISTULA PLACEMENT Left 10/19/2018   Procedure: LEFT ARTERIOVENOUS (AV) FISTULA CREATION;  Surgeon: Waynetta Sandy, MD;  Location: [facility];  Service: Vascular;  Laterality: Left;  . CESAREAN SECTION    . COLONOSCOPY N/A 01/12/2014   Procedure: COLONOSCOPY;  Surgeon: Rogene Houston, MD;  [facility];  Service: Endoscopy;  Laterality: N/A;  225  . TUBAL LIGATION      Allergies  Allergen Reactions  . Nutritional Supplements     walnut  .  Latex     Current Outpatient Medications  Medication Sig Dispense Refill  . acetaminophen (TYLENOL) 500 MG tablet Take 2 tablets (1,000 mg total) by mouth every 6 (six) hours as needed. 30 tablet 0  . albuterol (PROVENTIL HFA;VENTOLIN HFA) 108 (90 Base) MCG/ACT inhaler Inhale 2 puffs into the lungs every 6 (six) hours as needed for wheezing or shortness of breath. 1 Inhaler 11  . BIDIL 20-37.5 MG tablet Take 1 tablet by mouth 3 (three) times daily.    . Blood Glucose Monitoring Suppl (TRUE METRIX AIR GLUCOSE METER) w/Device KIT 1 Device by Does not apply route 2 (two) times daily at 8 am and 10 pm. 1 kit 0  . glucose blood (TRUE METRIX BLOOD GLUCOSE TEST) test strip Use as instructed to check blood sugars twice per day 100 each 6  . metoprolol tartrate (LOPRESSOR) 25 MG tablet Take 25 mg by mouth 2 (two) times daily.    . TRUEPLUS LANCETS 28G MISC Use to check blood sugar twice per day 100 each 6  . amLODipine (NORVASC) 5 MG tablet Take 1 tablet (5 mg total) by mouth daily for 30 days. 30 tablet 0  . atorvastatin (LIPITOR) 40 MG tablet Take 1  tablet (40 mg total) by mouth daily at 6 PM for 30 days. 30 tablet 0  . colchicine 0.6 MG tablet Take 0.5 tablets (0.3 mg total) by mouth daily for 30 days. 15 tablet 0   No current facility-administered medications for this visit.      REVIEW OF SYSTEMS: see HPI for pertinent positives and negatives    PHYSICAL EXAMINATION:  Vitals:   12/24/18 1440  BP: 132/70  Pulse: 84  Resp: 14  Temp: 97.9 F (36.6 C)  TempSrc: Temporal  SpO2: 98%  Weight: 238 lb 3.2 oz (108 kg)  Height: _0  (1.676 m)   Body mass index is 38.45 kg/m.   General: Obese female in NAD    HEENT:  No gross abnormalities Pulmonary: Respirations are non-labored Abdomen: Soft and non-tender Musculoskeletal: There are no major deformities.   Neurologic: No focal weakness or paresthesias are detected, bilateral hand grip strength is 5/5 Skin: There are no ulcer or rashes  noted. Psychiatric: The patient has normal affect. Cardiovascular: There is an irregular rhythm with controlled rate Left radial pulse is faintly palpable  Left upper arm AVF with noted thrill and bruit distally, is palpable more proximally   Non-Invasive Vascular Imaging  Left Upper Arm AVF Duplex (12-24-18): Findings: +--------------------+----------+-----------------+--------+ AVF                 PSV (cm/s)Flow Vol (mL/min)Comments +--------------------+----------+-----------------+--------+ Native artery inflow   179          1626                +--------------------+----------+-----------------+--------+ AVF Anastomosis        281                              +--------------------+----------+-----------------+--------+    +------------+----------+-------------+----------+------------------+ OUTFLOW VEINPSV (cm/s)Diameter (cm)Depth (cm)     Describe      +------------+----------+-------------+----------+------------------+ Shoulder       132        0.69        2.67                      +------------+----------+-------------+----------+------------------+ Prox UA        366        0.60        1.59   change in Diameter +------------+----------+-------------+----------+------------------+ Mid UA         142        0.90        0.77    competing branch  +------------+----------+-------------+----------+------------------+ Dist UA        208        0.97        0.42                      +------------+----------+-------------+----------+------------------+ AC Fossa       263        1.33        0.44                      +------------+----------+-------------+----------+------------------+ Outflow vein becomes tortuous in the proximal-mid upper arm.  Summary: Patent left brachiocephalic fistula with no obvious evidence of stenosis.    Medical Decision Making  Margaret Aguilar is a 58 y.o. female who is s/p Left arm cephalic vein  AV fistula creation on 10-19-18 by Dr. Donzetta Matters.   She has not yet started hemodialysis,  and it is not imminent.   AVF duplex today shows large enough diameters, but is too deep proximally and mid upper arm. I discussed with Lennie Muckle PA-C.  Will schedule superficialization of left upper arm AVF sometime soon, by Dr. Donzetta Matters.      Clemon Chambers, RN, MSN, FNP-C [facility] [place] Office: [phone]  12/24/2018, 3:03 PM  Clinic MD: Donzetta Matters on call

## 2019-01-04 DIAGNOSIS — I10 Essential (primary) hypertension: Secondary | ICD-10-CM | POA: Diagnosis not present

## 2019-01-04 DIAGNOSIS — E119 Type 2 diabetes mellitus without complications: Secondary | ICD-10-CM | POA: Diagnosis not present

## 2019-01-04 DIAGNOSIS — G4733 Obstructive sleep apnea (adult) (pediatric): Secondary | ICD-10-CM | POA: Diagnosis not present

## 2019-01-04 DIAGNOSIS — E785 Hyperlipidemia, unspecified: Secondary | ICD-10-CM | POA: Diagnosis not present

## 2019-01-04 DIAGNOSIS — N189 Chronic kidney disease, unspecified: Secondary | ICD-10-CM | POA: Diagnosis not present

## 2019-01-14 ENCOUNTER — Encounter (INDEPENDENT_AMBULATORY_CARE_PROVIDER_SITE_OTHER): Payer: Self-pay | Admitting: *Deleted

## 2019-02-18 ENCOUNTER — Ambulatory Visit: Payer: Self-pay | Admitting: Registered Nurse

## 2019-02-22 ENCOUNTER — Encounter: Payer: Self-pay | Admitting: Registered Nurse

## 2019-02-24 DIAGNOSIS — Z23 Encounter for immunization: Secondary | ICD-10-CM | POA: Diagnosis not present

## 2019-03-03 DIAGNOSIS — N2581 Secondary hyperparathyroidism of renal origin: Secondary | ICD-10-CM | POA: Diagnosis not present

## 2019-03-03 DIAGNOSIS — I129 Hypertensive chronic kidney disease with stage 1 through stage 4 chronic kidney disease, or unspecified chronic kidney disease: Secondary | ICD-10-CM | POA: Diagnosis not present

## 2019-03-03 DIAGNOSIS — M1990 Unspecified osteoarthritis, unspecified site: Secondary | ICD-10-CM | POA: Diagnosis not present

## 2019-03-03 DIAGNOSIS — R001 Bradycardia, unspecified: Secondary | ICD-10-CM | POA: Diagnosis not present

## 2019-03-03 DIAGNOSIS — T50905A Adverse effect of unspecified drugs, medicaments and biological substances, initial encounter: Secondary | ICD-10-CM | POA: Diagnosis not present

## 2019-03-03 DIAGNOSIS — I770 Arteriovenous fistula, acquired: Secondary | ICD-10-CM | POA: Diagnosis not present

## 2019-03-03 DIAGNOSIS — N184 Chronic kidney disease, stage 4 (severe): Secondary | ICD-10-CM | POA: Diagnosis not present

## 2019-03-03 DIAGNOSIS — M109 Gout, unspecified: Secondary | ICD-10-CM | POA: Diagnosis not present

## 2019-03-03 DIAGNOSIS — E785 Hyperlipidemia, unspecified: Secondary | ICD-10-CM | POA: Diagnosis not present

## 2019-03-03 DIAGNOSIS — Z6841 Body Mass Index (BMI) 40.0 and over, adult: Secondary | ICD-10-CM | POA: Diagnosis not present

## 2019-03-03 DIAGNOSIS — E1129 Type 2 diabetes mellitus with other diabetic kidney complication: Secondary | ICD-10-CM | POA: Diagnosis not present

## 2019-03-03 DIAGNOSIS — N189 Chronic kidney disease, unspecified: Secondary | ICD-10-CM | POA: Diagnosis not present

## 2019-03-03 DIAGNOSIS — D631 Anemia in chronic kidney disease: Secondary | ICD-10-CM | POA: Diagnosis not present

## 2019-03-28 ENCOUNTER — Other Ambulatory Visit: Payer: Self-pay

## 2019-03-28 ENCOUNTER — Telehealth: Payer: Self-pay | Admitting: Emergency Medicine

## 2019-03-28 ENCOUNTER — Ambulatory Visit
Admission: EM | Admit: 2019-03-28 | Discharge: 2019-03-28 | Disposition: A | Discharge status: Home / Self Care | Payer: Medicaid Other | Attending: Emergency Medicine | Admitting: Emergency Medicine

## 2019-03-28 DIAGNOSIS — R12 Heartburn: Secondary | ICD-10-CM

## 2019-03-28 DIAGNOSIS — R110 Nausea: Secondary | ICD-10-CM | POA: Diagnosis not present

## 2019-03-28 DIAGNOSIS — R059 Cough, unspecified: Secondary | ICD-10-CM

## 2019-03-28 DIAGNOSIS — R05 Cough: Secondary | ICD-10-CM

## 2019-03-28 DIAGNOSIS — Z20828 Contact with and (suspected) exposure to other viral communicable diseases: Secondary | ICD-10-CM | POA: Diagnosis not present

## 2019-03-28 DIAGNOSIS — Z20822 Contact with and (suspected) exposure to covid-19: Secondary | ICD-10-CM

## 2019-03-28 MED ORDER — ALBUTEROL SULFATE HFA 108 (90 BASE) MCG/ACT IN AERS: 1.0000 | INHALATION_SPRAY | Freq: Four times a day (QID) | RESPIRATORY_TRACT | 0 refills | Status: DC | PRN

## 2019-03-28 MED ORDER — ONDANSETRON HCL 4 MG PO TABS: 4.0000 mg | ORAL_TABLET | Freq: Three times a day (TID) | ORAL | 0 refills | Status: DC | PRN

## 2019-03-28 MED ORDER — BENZONATATE 100 MG PO CAPS: 100.0000 mg | ORAL_CAPSULE | Freq: Three times a day (TID) | ORAL | 0 refills | Status: DC

## 2019-03-28 MED ORDER — PANTOPRAZOLE SODIUM 40 MG PO TBEC: 40.0000 mg | DELAYED_RELEASE_TABLET | Freq: Every day | ORAL | 0 refills | Status: DC

## 2019-03-28 NOTE — Telephone Encounter (Signed)
Patient requesting Zofran as pharmacy did not have it. Meds was resent

## 2019-03-28 NOTE — ED Provider Notes (Signed)
[facility]    CSN: [id number] Arrival date & time: 03/28/19  1311      History   Chief Complaint Chief Complaint  Patient presents with  . Shortness of Breath  . Nausea    HPI Margaret Aguilar is a 58 y.o. female.   58 years old female with history of asthma, and gout presented to the urgent care with a complaint of Cough, mild shortness of breath, nausea and heartburn x 2 weeks.  Denies sick exposure to COVID, flu or strep.  Denies recent travel.  Denies aggravating or alleviating symptoms.  Denies previous COVID infection.   Denies fever, chills, fatigue, nasal congestion, rhinorrhea, sore throat, cough, SOB, wheezing, chest pain, nausea, vomiting, changes in bowel or bladder habits.     The history is provided by the patient. No language interpreter was used.    Past Medical History:  Diagnosis Date  . Anemia   . Anxiety   . Arthritis   . Asthma   . Bradycardia   . Diabetes mellitus without complication ([place])    x 5 yrs  . Herpes   . Hypertension   . Kidney disease    Stage 4  . 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 Pneumonia   . Sleep apnea    hasn't  gotten cpap yet    Patient Active Problem List   Diagnosis Date Noted  . Bradycardia 08/16/2018  . Gout attack 08/09/2018  . Acute on chronic renal failure ([place]) 08/08/2018  . CKD (chronic kidney disease) stage 4, GFR 15-29 ml/min (HCC) 01/09/2017  . Spondylosis of lumbosacral region 10/09/2016  . Type 2 diabetes mellitus, without long-term current use of insulin ([place]) 09/18/2016  . IDA (iron deficiency anemia) 12/14/2013  . Guaiac + stool 12/14/2013  . Hypertension 09/28/2013  . Diabetes ([place]) 09/28/2013  . Renal insufficiency 09/28/2013  . Morbid obesity ([place]) 09/28/2013  . Obstructive sleep apnea on CPAP 06/05/2011  . Hyperlipidemia 06/05/2011  . Gastroesophageal reflux disease 06/05/2011  . Arthritis 06/05/2011    Past Surgical History:   Procedure Laterality Date  . AV FISTULA PLACEMENT Left 10/19/2018   Procedure: LEFT ARTERIOVENOUS (AV) FISTULA CREATION;  Surgeon: Waynetta Sandy, MD;  Location: [facility];  Service: Vascular;  Laterality: Left;  . CESAREAN SECTION    . COLONOSCOPY N/A 01/12/2014   Procedure: COLONOSCOPY;  Surgeon: Rogene Houston, MD;  [facility];  Service: Endoscopy;  Laterality: N/A;  225  . TUBAL LIGATION      OB History   No obstetric history on file.      Home Medications    Prior to Admission medications   Medication Sig Start Date End Date Taking? Authorizing Provider  acetaminophen (TYLENOL) 500 MG tablet Take 2 tablets (1,000 mg total) by mouth every 6 (six) hours as needed. 08/11/18   Johnson, Clanford L, MD  albuterol (VENTOLIN HFA) 108 (90 Base) MCG/ACT inhaler Inhale 1-2 puffs into the lungs every 6 (six) hours as needed for wheezing or shortness of breath. 03/28/19   Shakhia Gramajo, Darrelyn Hillock, FNP  amLODipine (NORVASC) 5 MG tablet Take 1 tablet (5 mg total) by mouth daily for 30 days. 08/17/18 09/16/18  Johnson, Clanford L, MD  atorvastatin (LIPITOR) 40 MG tablet Take 1 tablet (40 mg total) by mouth daily at 6 PM for 30 days. 08/17/18 09/16/18  Johnson, Clanford L, MD  benzonatate (TESSALON) 100 MG capsule Take 1 capsule (100 mg total) by  mouth every 8 (eight) hours. 03/28/19   Jeree Delcid, Darrelyn Hillock, FNP  BIDIL 20-37.5 MG tablet Take 1 tablet by mouth 3 (three) times daily. 09/28/18   [provider]  Blood Glucose Monitoring Suppl (TRUE METRIX AIR GLUCOSE METER) w/Device KIT 1 Device by Does not apply route 2 (two) times daily at 8 am and 10 pm. 01/12/18   Fulp, Cammie, MD  colchicine 0.6 MG tablet Take 0.5 tablets (0.3 mg total) by mouth daily for 30 days. 08/12/18 09/11/18  Johnson, Clanford L, MD  glucose blood (TRUE METRIX BLOOD GLUCOSE TEST) test strip Use as instructed to check blood sugars twice per day 01/12/18   Fulp, Cammie, MD  metoprolol tartrate (LOPRESSOR) 25 MG tablet  Take 25 mg by mouth 2 (two) times daily.    [provider]  ondansetron (ZOFRAN) 4 MG tablet Take 1 tablet (4 mg total) by mouth every 8 (eight) hours as needed for nausea or vomiting. 03/28/19   Chaquana Nichols, Darrelyn Hillock, FNP  pantoprazole (PROTONIX) 40 MG tablet Take 1 tablet (40 mg total) by mouth daily. 03/28/19   Taleyah Hillman, Darrelyn Hillock, FNP  TRUEPLUS LANCETS 28G MISC Use to check blood sugar twice per day 01/12/18   Antony Blackbird, MD    Family History Family History  Problem Relation Age of Onset  . Diabetes Mother   . Hyperlipidemia Mother   . Hyperlipidemia Father   . Hypertension Father   . Stroke Brother   . Diabetes Brother     Social History Social History   Tobacco Use  . Smoking status: Never Smoker  . Smokeless tobacco: Never Used  Substance Use Topics  . Alcohol use: No  . Drug use: Yes    Types: Marijuana    Comment: occasional      Allergies   Nutritional supplements and Latex   Review of Systems Review of Systems  Constitutional: Negative.   HENT: Negative.   Respiratory: Positive for cough and shortness of breath.   Cardiovascular: Negative.   Gastrointestinal: Positive for nausea.  Neurological: Negative.   ROS: All other are negatives  Physical Exam Triage Vital Signs ED Triage Vitals  Enc Vitals Group     BP 03/28/19 1421 (!) 158/104     Pulse Rate 03/28/19 1421 60     Resp 03/28/19 1421 18     Temp 03/28/19 1421 98.3 F (36.8 C)     Temp src --      SpO2 03/28/19 1421 98 %     Weight --      Height --      Head Circumference --      Peak Flow --      Pain Score 03/28/19 1419 0     Pain Loc --      Pain Edu? --      Excl. in [place]? --    No data found.  Updated Vital Signs BP (!) 158/104   Pulse 60   Temp 98.3 F (36.8 C)   Resp 18   SpO2 98%   Visual Acuity Right Eye Distance:   Left Eye Distance:   Bilateral Distance:    Right Eye Near:   Left Eye Near:    Bilateral Near:     Physical Exam Vitals and nursing note  reviewed.  Constitutional:      General: She is not in acute distress.    Appearance: Normal appearance. She is normal weight. She is not ill-appearing or toxic-appearing.  HENT:  Head: Normocephalic.     Right Ear: Tympanic membrane, ear canal and external ear normal. There is no impacted cerumen.     Left Ear: Ear canal and external ear normal. There is no impacted cerumen.     Nose: Nose normal. No congestion.     Mouth/Throat:     Mouth: Mucous membranes are moist.     Pharynx: No oropharyngeal exudate or posterior oropharyngeal erythema.  Cardiovascular:     Rate and Rhythm: Normal rate and regular rhythm.     Pulses: Normal pulses.     Heart sounds: Normal heart sounds. No murmur.  Pulmonary:     Effort: Pulmonary effort is normal. No respiratory distress.     Breath sounds: No wheezing, rhonchi or rales.  Chest:     Chest wall: No tenderness.  Abdominal:     General: Abdomen is flat. Bowel sounds are normal. There is no distension.     Palpations: There is no mass.     Tenderness: There is no abdominal tenderness.     Hernia: No hernia is present.  Skin:    Capillary Refill: Capillary refill takes less than 2 seconds.  Neurological:     Mental Status: She is alert and oriented to person, place, and time.      UC Treatments / Results  Labs (all labs ordered are listed, but only abnormal results are displayed) Labs Reviewed  NOVEL CORONAVIRUS, NAA    EKG   Radiology No results found.  Procedures Procedures (including critical care time)  Medications Ordered in UC Medications - No data to display  Initial Impression / Assessment and Plan / UC Course  I have reviewed the triage vital signs and the nursing notes.  Pertinent labs & imaging results that were available during my care of the patient were reviewed by me and considered in my medical decision making (see chart for details).    Patient is stable for discharge.  Benign physical exam.  Covid test  was ordered and will call patient if result is abnormal.  Tessalon will be prescribed for cough, Zofran for nausea, proair for shortness of breath and Protonix for heartburn.  Advised patient to follow-up with primary care or to return if symptoms get worse. Final Clinical Impressions(s) / UC Diagnoses   Final diagnoses:  Suspected COVID-19 virus infection  Nausea without vomiting  Heartburn  Cough     Discharge Instructions     COVID testing ordered.  It will take between 2-7 days for test results.  Someone will contact you regarding abnormal results.    In the meantime: You should remain isolated in your home for 10 days from symptom onset Get plenty of rest and push fluids Tessalon Perles prescribed for cough Zofran for nausea Pro-Air for Shortness of breath Protonix for heartburn Use medications daily for symptom relief Use OTC medications like ibuprofen or tylenol as needed fever or pain Call or go to the ED if you have any new or worsening symptoms such as fever, worsening cough, shortness of breath, chest tightness, chest pain, turning blue, changes in mental status, etc...     ED Prescriptions    Medication Sig Dispense Auth. Provider   benzonatate (TESSALON) 100 MG capsule Take 1 capsule (100 mg total) by mouth every 8 (eight) hours. 21 capsule Jacqueli Pangallo S, FNP   albuterol (VENTOLIN HFA) 108 (90 Base) MCG/ACT inhaler Inhale 1-2 puffs into the lungs every 6 (six) hours as needed for wheezing or shortness of breath.  18 g Khi Mcmillen S, FNP   pantoprazole (PROTONIX) 40 MG tablet Take 1 tablet (40 mg total) by mouth daily. 30 tablet Cira Deyoe S, FNP   ondansetron (ZOFRAN) 4 MG tablet Take 1 tablet (4 mg total) by mouth every 8 (eight) hours as needed for nausea or vomiting. 4 tablet Caylea Foronda, Darrelyn Hillock, FNP     PDMP not reviewed this encounter.   Emerson Monte, [place] 03/28/19 1515

## 2019-03-28 NOTE — ED Triage Notes (Signed)
Pt presents with cough and mild SOB , pt also states that she has had vomiting after she eats for past 2 weeks , 8 lb weight loss in 2 weeks

## 2019-03-28 NOTE — Discharge Instructions (Addendum)
COVID testing ordered.  It will take between 2-7 days for test results.  Someone will contact you regarding abnormal results.    In the meantime: You should remain isolated in your home for 10 days from symptom onset Get plenty of rest and push fluids Tessalon Perles prescribed for cough Zofran for nausea Pro-Air for Shortness of breath Protonix for heartburn Use medications daily for symptom relief Use OTC medications like ibuprofen or tylenol as needed fever or pain Call or go to the ED if you have any new or worsening symptoms such as fever, worsening cough, shortness of breath, chest tightness, chest pain, turning blue, changes in mental status, etc..[name]

## 2019-03-30 LAB — NOVEL CORONAVIRUS, NAA: SARS-CoV-2, NAA: NOT DETECTED

## 2019-05-20 DIAGNOSIS — D631 Anemia in chronic kidney disease: Secondary | ICD-10-CM | POA: Diagnosis not present

## 2019-05-20 DIAGNOSIS — M1990 Unspecified osteoarthritis, unspecified site: Secondary | ICD-10-CM | POA: Diagnosis not present

## 2019-05-20 DIAGNOSIS — N184 Chronic kidney disease, stage 4 (severe): Secondary | ICD-10-CM | POA: Diagnosis not present

## 2019-05-20 DIAGNOSIS — E785 Hyperlipidemia, unspecified: Secondary | ICD-10-CM | POA: Diagnosis not present

## 2019-05-20 DIAGNOSIS — I129 Hypertensive chronic kidney disease with stage 1 through stage 4 chronic kidney disease, or unspecified chronic kidney disease: Secondary | ICD-10-CM | POA: Diagnosis not present

## 2019-05-20 DIAGNOSIS — M109 Gout, unspecified: Secondary | ICD-10-CM | POA: Diagnosis not present

## 2019-05-20 DIAGNOSIS — N189 Chronic kidney disease, unspecified: Secondary | ICD-10-CM | POA: Diagnosis not present

## 2019-05-20 DIAGNOSIS — E1129 Type 2 diabetes mellitus with other diabetic kidney complication: Secondary | ICD-10-CM | POA: Diagnosis not present

## 2019-05-20 DIAGNOSIS — N2581 Secondary hyperparathyroidism of renal origin: Secondary | ICD-10-CM | POA: Diagnosis not present

## 2019-05-20 DIAGNOSIS — Z6841 Body Mass Index (BMI) 40.0 and over, adult: Secondary | ICD-10-CM | POA: Diagnosis not present

## 2019-05-20 DIAGNOSIS — T50905A Adverse effect of unspecified drugs, medicaments and biological substances, initial encounter: Secondary | ICD-10-CM | POA: Diagnosis not present

## 2019-05-20 DIAGNOSIS — R001 Bradycardia, unspecified: Secondary | ICD-10-CM | POA: Diagnosis not present

## 2019-06-14 ENCOUNTER — Other Ambulatory Visit: Payer: Self-pay

## 2019-06-16 DIAGNOSIS — I770 Arteriovenous fistula, acquired: Secondary | ICD-10-CM | POA: Diagnosis not present

## 2019-06-16 DIAGNOSIS — E1129 Type 2 diabetes mellitus with other diabetic kidney complication: Secondary | ICD-10-CM | POA: Diagnosis not present

## 2019-06-16 DIAGNOSIS — M109 Gout, unspecified: Secondary | ICD-10-CM | POA: Diagnosis not present

## 2019-06-16 DIAGNOSIS — N189 Chronic kidney disease, unspecified: Secondary | ICD-10-CM | POA: Diagnosis not present

## 2019-06-16 DIAGNOSIS — N184 Chronic kidney disease, stage 4 (severe): Secondary | ICD-10-CM | POA: Diagnosis not present

## 2019-06-16 DIAGNOSIS — Z6841 Body Mass Index (BMI) 40.0 and over, adult: Secondary | ICD-10-CM | POA: Diagnosis not present

## 2019-06-16 DIAGNOSIS — D631 Anemia in chronic kidney disease: Secondary | ICD-10-CM | POA: Diagnosis not present

## 2019-06-22 ENCOUNTER — Other Ambulatory Visit (HOSPITAL_COMMUNITY): Payer: Self-pay

## 2019-06-23 ENCOUNTER — Other Ambulatory Visit: Payer: Self-pay

## 2019-06-23 ENCOUNTER — Encounter (HOSPITAL_COMMUNITY)
Admission: RE | Admit: 2019-06-23 | Discharge: 2019-06-23 | Disposition: A | Discharge status: Home / Self Care | Payer: Medicaid Other | Source: Ambulatory Visit | Attending: Nephrology | Admitting: Nephrology

## 2019-06-23 DIAGNOSIS — N189 Chronic kidney disease, unspecified: Secondary | ICD-10-CM | POA: Insufficient documentation

## 2019-06-23 DIAGNOSIS — D631 Anemia in chronic kidney disease: Secondary | ICD-10-CM | POA: Diagnosis not present

## 2019-06-23 MED ORDER — SODIUM CHLORIDE 0.9 % IV SOLN
510.0000 mg | INTRAVENOUS | Status: DC
  Administered 2019-06-23: 510 mg via INTRAVENOUS
  Filled 2019-06-23: qty 510

## 2019-06-23 NOTE — Discharge Instructions (Signed)

## 2019-06-25 ENCOUNTER — Other Ambulatory Visit (HOSPITAL_COMMUNITY): Payer: Medicaid Other

## 2019-06-27 ENCOUNTER — Other Ambulatory Visit (HOSPITAL_COMMUNITY)
Admission: RE | Admit: 2019-06-27 | Discharge: 2019-06-27 | Disposition: A | Discharge status: Home / Self Care | Payer: Medicaid Other | Source: Ambulatory Visit | Attending: Vascular Surgery | Admitting: Vascular Surgery

## 2019-06-27 DIAGNOSIS — Z20822 Contact with and (suspected) exposure to covid-19: Secondary | ICD-10-CM | POA: Diagnosis not present

## 2019-06-27 DIAGNOSIS — Z01812 Encounter for preprocedural laboratory examination: Secondary | ICD-10-CM | POA: Diagnosis not present

## 2019-06-27 LAB — SARS CORONAVIRUS 2 (TAT 6-24 HRS): SARS Coronavirus 2: NEGATIVE

## 2019-06-28 ENCOUNTER — Other Ambulatory Visit: Payer: Self-pay

## 2019-06-28 ENCOUNTER — Encounter (HOSPITAL_COMMUNITY)
Admission: RE | Admit: 2019-06-28 | Discharge: 2019-06-28 | Disposition: A | Discharge status: Home / Self Care | Payer: Medicaid Other | Source: Ambulatory Visit | Attending: Nephrology | Admitting: Nephrology

## 2019-06-28 DIAGNOSIS — D631 Anemia in chronic kidney disease: Secondary | ICD-10-CM | POA: Diagnosis not present

## 2019-06-28 DIAGNOSIS — N189 Chronic kidney disease, unspecified: Secondary | ICD-10-CM | POA: Diagnosis not present

## 2019-06-28 MED ORDER — SODIUM CHLORIDE 0.9 % IV SOLN
510.0000 mg | INTRAVENOUS | Status: AC
  Administered 2019-06-28: 510 mg via INTRAVENOUS
  Filled 2019-06-28: qty 510

## 2019-06-29 ENCOUNTER — Other Ambulatory Visit: Payer: Self-pay

## 2019-06-29 ENCOUNTER — Encounter (HOSPITAL_COMMUNITY): Payer: Self-pay | Admitting: Vascular Surgery

## 2019-06-29 NOTE — Progress Notes (Signed)
Patient denies shortness of breath, fever, cough and chest pain.  PCP - None Cardiologist - Dr [name] Nephrologist - Dr Donato Heinz  Chest x-ray - denies EKG - 08/17/18 Stress Test - Cardiolite 11/17/2002 ECHO - 08/17/18 Cardiac Cath - 11/17/2002  Sleep Study - Yes CPAP - No CPAP Machine.    Fasting Blood Sugar - Unknown Checks Blood Sugar _0_ times a day.  Patient checks blood sugar occasionally. Patient is not taking any diabetes medications. Diet controlled per patient.  Anesthesia review: Yes  STOP now taking any Aspirin (unless otherwise instructed by your surgeon), Aleve, Naproxen, Ibuprofen, Motrin, Advil, Goody's, BC's, all herbal medications, fish oil, and all vitamins.   Coronavirus Screening Covid test on 06/27/19 was negative.  Patient verbalized understanding of instructions that were given via phone.

## 2019-06-29 NOTE — Progress Notes (Signed)
Anesthesia Chart Review:  Pt is a same day work up    Case: [id number] Date/Time: 06/30/19 0715   Procedure: FISTULA SUPERFICIALIZATION (Left )   Anesthesia type: Monitor Anesthesia Care   Pre-op diagnosis: CHRONIC KIDNEY DISEASE STAGE 4   Location: [facility] ROOM 11 / [facility]   Surgeons: Waynetta Sandy, MD      DISCUSSION:  Patient is a 59 year old female scheduled for the above procedure.   History includes never smoker, DM2, HTN , asthma, bradycardia (improved off b-blocker), CKD stage IV, anemia.   - Admitted 08/16/18-08/17/18 for bradycardia. Vitals by HHPT showed a HR in the 30's, and patient had already taken metoprolol that morning. HR 33-38 bpm in ED, SBP 140-180's. Cardiology consulted, seen by Dorris Carnes, MD, [facility] primary cardiologist is [name], MD. Metoprolol was discontinued and amlodipine added with out-patient follow-up with Dr. [name]. (HR 78 bpm at 09/27/18 follow-up.)   - Admitted 08/08/18-08/11/18 for gouty arthritis involving the left ankle and AKI/CKD stage IV (presumed ATN). Nephrology consulted. acute on chronic kidney disease. Renal US with increased echogenicity and without obstruction. 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 ACE-I and HCTZ placed on hold. Out-patient nephrology follow-up recommended.    PROVIDERS: [name], MD is PCP/cardiologist.  Donato Heinz, MD is nephrologist.   LABS: She is for labs on arrival.    EKG: 08/17/18: Marked SB at 38 BPM. Possible LAE. (Metoprolol has since been discontinued.)   CV: Echo 08/17/18: IMPRESSIONS 1. The left ventricle has normal systolic function, with an ejection fraction of 60-65%. The cavity size was normal. There is mildly increased left ventricular wall thickness. Left ventricular diastolic parameters were normal. 2. The right ventricle has normal systolc function. The cavity was normal. There is no  increase in right ventricular wall thickness. 3. Left atrial size was moderately dilated. 4. Mild thickening of the mitral valve leaflet. 5. The aortic valve is tricuspid Mild thickening of the aortic valve Mild calcification of the aortic valve. Aortic valve regurgitation is mild by color flow Doppler. 6. Mild pulmonic stenosis. 7. The aortic root is normal in size and structure.  Cardiac cath 11/17/02 (following abnormal stress test):  FINDINGS: LV showed good LV systolic function, LVH, EF of 60-65%. Left main was patent. LAD has 10% ostial stenosis and 10-20% mid and distal stenosis. Diagonal 1 is large which is patent. Left circumflex is large which is patent. OM 1 is very, very small which is less than 1 mm. OM 2 is large which is patent which gives off one branch in the mid portion, therefore it bifurcates into superior and inferior branch. RCA is small which is nondominant which is patent.   Past Medical History:  Diagnosis Date  . Anemia   . Anxiety   . Arthritis   . Asthma   . Bradycardia   . Depression   . Diabetes mellitus without complication ([place])    x 5 yrs  . GERD (gastroesophageal reflux disease)   . Gout   . Herpes   . HLD (hyperlipidemia)   . Hypertension   . Kidney disease    Stage 4  . 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 Pneumonia    several  . Sleep apnea    hasn't gotten cpap yet    Past Surgical History:  Procedure Laterality Date  . AV FISTULA PLACEMENT Left 10/19/2018  Procedure: LEFT ARTERIOVENOUS (AV) FISTULA CREATION;  Surgeon: Waynetta Sandy, MD;  Location: [facility];  Service: Vascular;  Laterality: Left;  . CESAREAN SECTION     x 1  . COLONOSCOPY N/A 01/12/2014   Procedure: COLONOSCOPY;  Surgeon: Rogene Houston, MD;  [facility];  Service: Endoscopy;  Laterality: N/A;  225  . TUBAL LIGATION      MEDICATIONS: No current facility-administered medications for this  encounter.   [name] acetaminophen (TYLENOL) 500 MG tablet  . albuterol (VENTOLIN HFA) 108 (90 Base) MCG/ACT inhaler  . allopurinol (ZYLOPRIM) 100 MG tablet  . amLODipine (NORVASC) 5 MG tablet  . BIDIL 20-37.5 MG tablet  . metoprolol succinate (TOPROL-XL) 100 MG 24 hr tablet  . amoxicillin (AMOXIL) 500 MG tablet  . atorvastatin (LIPITOR) 40 MG tablet  . benzonatate (TESSALON) 100 MG capsule  . Blood Glucose Monitoring Suppl (TRUE METRIX AIR GLUCOSE METER) w/Device KIT  . colchicine 0.6 MG tablet  . glucose blood (TRUE METRIX BLOOD GLUCOSE TEST) test strip  . ondansetron (ZOFRAN) 4 MG tablet  . pantoprazole (PROTONIX) 40 MG tablet  . TRUEPLUS LANCETS 28G MISC    If labs acceptable day of surgery, I anticipate pt can proceed with surgery as scheduled.  Willeen Cass, FNP-BC [facility]/Anesthesiology Phone: [phone] 06/29/2019 3:26 PM

## 2019-06-30 ENCOUNTER — Ambulatory Visit (HOSPITAL_COMMUNITY)
Admission: RE | Admit: 2019-06-30 | Discharge: 2019-06-30 | Disposition: A | Discharge status: Home / Self Care | Payer: Medicaid Other | Attending: Vascular Surgery | Admitting: Vascular Surgery

## 2019-06-30 ENCOUNTER — Encounter (HOSPITAL_COMMUNITY): Payer: Self-pay | Admitting: Vascular Surgery

## 2019-06-30 ENCOUNTER — Ambulatory Visit (HOSPITAL_COMMUNITY): Payer: Medicaid Other | Admitting: Certified Registered Nurse Anesthetist

## 2019-06-30 ENCOUNTER — Encounter (HOSPITAL_COMMUNITY)
Admission: RE | Disposition: A | Discharge status: Home / Self Care | Payer: Self-pay | Source: Home / Self Care | Attending: Vascular Surgery

## 2019-06-30 DIAGNOSIS — I129 Hypertensive chronic kidney disease with stage 1 through stage 4 chronic kidney disease, or unspecified chronic kidney disease: Secondary | ICD-10-CM | POA: Diagnosis not present

## 2019-06-30 DIAGNOSIS — N179 Acute kidney failure, unspecified: Secondary | ICD-10-CM | POA: Diagnosis not present

## 2019-06-30 DIAGNOSIS — Z79899 Other long term (current) drug therapy: Secondary | ICD-10-CM | POA: Diagnosis not present

## 2019-06-30 DIAGNOSIS — G4730 Sleep apnea, unspecified: Secondary | ICD-10-CM | POA: Diagnosis not present

## 2019-06-30 DIAGNOSIS — R001 Bradycardia, unspecified: Secondary | ICD-10-CM | POA: Diagnosis not present

## 2019-06-30 DIAGNOSIS — T82898A Other specified complication of vascular prosthetic devices, implants and grafts, initial encounter: Secondary | ICD-10-CM | POA: Diagnosis not present

## 2019-06-30 DIAGNOSIS — E669 Obesity, unspecified: Secondary | ICD-10-CM | POA: Insufficient documentation

## 2019-06-30 DIAGNOSIS — F419 Anxiety disorder, unspecified: Secondary | ICD-10-CM | POA: Insufficient documentation

## 2019-06-30 DIAGNOSIS — N184 Chronic kidney disease, stage 4 (severe): Secondary | ICD-10-CM | POA: Insufficient documentation

## 2019-06-30 DIAGNOSIS — E1122 Type 2 diabetes mellitus with diabetic chronic kidney disease: Secondary | ICD-10-CM | POA: Diagnosis not present

## 2019-06-30 DIAGNOSIS — Z6838 Body mass index (BMI) 38.0-38.9, adult: Secondary | ICD-10-CM | POA: Diagnosis not present

## 2019-06-30 DIAGNOSIS — I120 Hypertensive chronic kidney disease with stage 5 chronic kidney disease or end stage renal disease: Secondary | ICD-10-CM | POA: Diagnosis not present

## 2019-06-30 DIAGNOSIS — J45909 Unspecified asthma, uncomplicated: Secondary | ICD-10-CM | POA: Diagnosis not present

## 2019-06-30 DIAGNOSIS — N185 Chronic kidney disease, stage 5: Secondary | ICD-10-CM | POA: Diagnosis not present

## 2019-06-30 DIAGNOSIS — N186 End stage renal disease: Secondary | ICD-10-CM | POA: Diagnosis not present

## 2019-06-30 HISTORY — DX: Depression, unspecified: F32.A

## 2019-06-30 HISTORY — DX: Hyperlipidemia, unspecified: E78.5

## 2019-06-30 HISTORY — DX: Gout, unspecified: M10.9

## 2019-06-30 HISTORY — PX: BASCILIC VEIN TRANSPOSITION: SHX5742

## 2019-06-30 HISTORY — DX: Gastro-esophageal reflux disease without esophagitis: K21.9

## 2019-06-30 LAB — POCT I-STAT, CHEM 8
BUN: 36 mg/dL — ABNORMAL HIGH (ref 6–20)
Calcium, Ion: 1.22 mmol/L (ref 1.15–1.40)
Chloride: 108 mmol/L (ref 98–111)
Creatinine, Ser: 3.7 mg/dL — ABNORMAL HIGH (ref 0.44–1.00)
Glucose, Bld: 100 mg/dL — ABNORMAL HIGH (ref 70–99)
HCT: 38 % (ref 36.0–46.0)
Hemoglobin: 12.9 g/dL (ref 12.0–15.0)
Potassium: 3.2 mmol/L — ABNORMAL LOW (ref 3.5–5.1)
Sodium: 143 mmol/L (ref 135–145)
TCO2: 25 mmol/L (ref 22–32)

## 2019-06-30 LAB — GLUCOSE, CAPILLARY
Glucose-Capillary: 104 mg/dL — ABNORMAL HIGH (ref 70–99)
Glucose-Capillary: 125 mg/dL — ABNORMAL HIGH (ref 70–99)

## 2019-06-30 SURGERY — TRANSPOSITION, VEIN, BASILIC
Anesthesia: General | Site: Arm Upper | Laterality: Left

## 2019-06-30 MED ORDER — ACETAMINOPHEN 500 MG PO TABS: 1000.0000 mg | ORAL_TABLET | Freq: Once | ORAL | Status: DC | PRN

## 2019-06-30 MED ORDER — SODIUM CHLORIDE 0.9 % IV SOLN: INTRAVENOUS | Status: DC

## 2019-06-30 MED ORDER — EPHEDRINE SULFATE-NACL 50-0.9 MG/10ML-% IV SOSY
PREFILLED_SYRINGE | INTRAVENOUS | Status: DC | PRN
  Administered 2019-06-30: 10 mg via INTRAVENOUS
  Administered 2019-06-30 (×2): 5 mg via INTRAVENOUS
  Administered 2019-06-30: 10 mg via INTRAVENOUS
  Administered 2019-06-30 (×2): 5 mg via INTRAVENOUS
  Administered 2019-06-30: 10 mg via INTRAVENOUS

## 2019-06-30 MED ORDER — PROPOFOL 10 MG/ML IV BOLUS
INTRAVENOUS | Status: AC
  Filled 2019-06-30: qty 40

## 2019-06-30 MED ORDER — MIDAZOLAM HCL 5 MG/5ML IJ SOLN
INTRAMUSCULAR | Status: DC | PRN
  Administered 2019-06-30 (×2): 1 mg via INTRAVENOUS

## 2019-06-30 MED ORDER — ONDANSETRON HCL 4 MG/2ML IJ SOLN
INTRAMUSCULAR | Status: DC | PRN
  Administered 2019-06-30: 4 mg via INTRAVENOUS

## 2019-06-30 MED ORDER — ROCURONIUM BROMIDE 100 MG/10ML IV SOLN
INTRAVENOUS | Status: DC | PRN
  Administered 2019-06-30: 30 mg via INTRAVENOUS

## 2019-06-30 MED ORDER — 0.9 % SODIUM CHLORIDE (POUR BTL) OPTIME
TOPICAL | Status: DC | PRN
  Administered 2019-06-30: 08:00:00 1000 mL

## 2019-06-30 MED ORDER — ACETAMINOPHEN 160 MG/5ML PO SOLN: 1000.0000 mg | Freq: Once | ORAL | Status: DC | PRN

## 2019-06-30 MED ORDER — EPHEDRINE 5 MG/ML INJ
INTRAVENOUS | Status: AC
  Filled 2019-06-30: qty 10

## 2019-06-30 MED ORDER — OXYCODONE HCL 5 MG PO TABS
5.0000 mg | ORAL_TABLET | Freq: Once | ORAL | Status: AC | PRN
  Administered 2019-06-30: 5 mg via ORAL

## 2019-06-30 MED ORDER — LIDOCAINE 2% (20 MG/ML) 5 ML SYRINGE
INTRAMUSCULAR | Status: AC
  Filled 2019-06-30: qty 5

## 2019-06-30 MED ORDER — SODIUM CHLORIDE 0.9 % IV SOLN
INTRAVENOUS | Status: AC
  Filled 2019-06-30: qty 1.2

## 2019-06-30 MED ORDER — DEXAMETHASONE SODIUM PHOSPHATE 10 MG/ML IJ SOLN
INTRAMUSCULAR | Status: AC
  Filled 2019-06-30: qty 1

## 2019-06-30 MED ORDER — ACETAMINOPHEN 10 MG/ML IV SOLN
1000.0000 mg | Freq: Once | INTRAVENOUS | Status: DC | PRN
  Administered 2019-06-30: 1000 mg via INTRAVENOUS

## 2019-06-30 MED ORDER — PROPOFOL 10 MG/ML IV BOLUS
INTRAVENOUS | Status: DC | PRN
  Administered 2019-06-30: 20 mg via INTRAVENOUS
  Administered 2019-06-30: 40 mg via INTRAVENOUS
  Administered 2019-06-30: 30 mg via INTRAVENOUS
  Administered 2019-06-30: 140 mg via INTRAVENOUS

## 2019-06-30 MED ORDER — CEFAZOLIN SODIUM-DEXTROSE 2-4 GM/100ML-% IV SOLN
2.0000 g | INTRAVENOUS | Status: AC
  Administered 2019-06-30: 2 g via INTRAVENOUS

## 2019-06-30 MED ORDER — ONDANSETRON HCL 4 MG/2ML IJ SOLN
INTRAMUSCULAR | Status: AC
  Filled 2019-06-30: qty 2

## 2019-06-30 MED ORDER — SUCCINYLCHOLINE CHLORIDE 200 MG/10ML IV SOSY
PREFILLED_SYRINGE | INTRAVENOUS | Status: AC
  Filled 2019-06-30: qty 10

## 2019-06-30 MED ORDER — MIDAZOLAM HCL 2 MG/2ML IJ SOLN
INTRAMUSCULAR | Status: AC
  Filled 2019-06-30: qty 2

## 2019-06-30 MED ORDER — SUCCINYLCHOLINE CHLORIDE 20 MG/ML IJ SOLN
INTRAMUSCULAR | Status: DC | PRN
  Administered 2019-06-30: 140 mg via INTRAVENOUS

## 2019-06-30 MED ORDER — LIDOCAINE HCL (PF) 1 % IJ SOLN
INTRAMUSCULAR | Status: DC | PRN
  Administered 2019-06-30: 30 mL

## 2019-06-30 MED ORDER — OXYCODONE-ACETAMINOPHEN 5-325 MG PO TABS: 1.0000 | ORAL_TABLET | ORAL | 0 refills | Status: DC | PRN

## 2019-06-30 MED ORDER — DEXAMETHASONE SODIUM PHOSPHATE 10 MG/ML IJ SOLN
INTRAMUSCULAR | Status: DC | PRN
  Administered 2019-06-30: 4 mg via INTRAVENOUS

## 2019-06-30 MED ORDER — SUGAMMADEX SODIUM 200 MG/2ML IV SOLN
INTRAVENOUS | Status: DC | PRN
  Administered 2019-06-30: 50 mg via INTRAVENOUS
  Administered 2019-06-30: 250 mg via INTRAVENOUS

## 2019-06-30 MED ORDER — OXYCODONE HCL 5 MG/5ML PO SOLN: 5.0000 mg | Freq: Once | ORAL | Status: AC | PRN

## 2019-06-30 MED ORDER — FENTANYL CITRATE (PF) 250 MCG/5ML IJ SOLN
INTRAMUSCULAR | Status: DC | PRN
  Administered 2019-06-30 (×2): 25 ug via INTRAVENOUS
  Administered 2019-06-30: 50 ug via INTRAVENOUS

## 2019-06-30 MED ORDER — LIDOCAINE HCL (PF) 1 % IJ SOLN
INTRAMUSCULAR | Status: AC
  Filled 2019-06-30: qty 30

## 2019-06-30 MED ORDER — CHLORHEXIDINE GLUCONATE 4 % EX LIQD: 60.0000 mL | Freq: Once | CUTANEOUS | Status: DC

## 2019-06-30 MED ORDER — FENTANYL CITRATE (PF) 250 MCG/5ML IJ SOLN
INTRAMUSCULAR | Status: AC
  Filled 2019-06-30: qty 5

## 2019-06-30 MED ORDER — SODIUM CHLORIDE 0.9 % IV SOLN: INTRAVENOUS | Status: DC | PRN

## 2019-06-30 MED ORDER — SODIUM CHLORIDE 0.9 % IV SOLN
INTRAVENOUS | Status: DC | PRN
  Administered 2019-06-30: 500 mL

## 2019-06-30 MED ORDER — FENTANYL CITRATE (PF) 100 MCG/2ML IJ SOLN: 25.0000 ug | INTRAMUSCULAR | Status: DC | PRN

## 2019-06-30 MED ORDER — ALBUTEROL SULFATE HFA 108 (90 BASE) MCG/ACT IN AERS
INHALATION_SPRAY | RESPIRATORY_TRACT | Status: DC | PRN
  Administered 2019-06-30: 3 via RESPIRATORY_TRACT

## 2019-06-30 MED ORDER — PHENYLEPHRINE 40 MCG/ML (10ML) SYRINGE FOR IV PUSH (FOR BLOOD PRESSURE SUPPORT)
PREFILLED_SYRINGE | INTRAVENOUS | Status: DC | PRN
  Administered 2019-06-30 (×3): 40 ug via INTRAVENOUS

## 2019-06-30 MED ORDER — LIDOCAINE 2% (20 MG/ML) 5 ML SYRINGE
INTRAMUSCULAR | Status: DC | PRN
  Administered 2019-06-30: 60 mg via INTRAVENOUS

## 2019-06-30 MED ORDER — OXYCODONE HCL 5 MG PO TABS
ORAL_TABLET | ORAL | Status: AC
  Filled 2019-06-30: qty 1

## 2019-06-30 MED ORDER — CEFAZOLIN SODIUM-DEXTROSE 2-4 GM/100ML-% IV SOLN
INTRAVENOUS | Status: AC
  Filled 2019-06-30: qty 100

## 2019-06-30 MED ORDER — ACETAMINOPHEN 10 MG/ML IV SOLN
INTRAVENOUS | Status: AC
  Filled 2019-06-30: qty 100

## 2019-06-30 MED ORDER — PROPOFOL 10 MG/ML IV BOLUS
INTRAVENOUS | Status: AC
  Filled 2019-06-30: qty 20

## 2019-06-30 SURGICAL SUPPLY — 38 items
ADH SKN CLS APL DERMABOND .7 (GAUZE/BANDAGES/DRESSINGS) ×2
ARMBAND PINK RESTRICT EXTREMIT (MISCELLANEOUS) ×4 IMPLANT
CANISTER SUCT 3000ML PPV (MISCELLANEOUS) ×4 IMPLANT
CLIP VESOCCLUDE MED 6/CT (CLIP) ×4 IMPLANT
CLIP VESOCCLUDE SM WIDE 24/CT (CLIP) ×3 IMPLANT
CLIP VESOCCLUDE SM WIDE 6/CT (CLIP) ×4 IMPLANT
COVER PROBE W GEL 5X96 (DRAPES) ×4 IMPLANT
COVER WAND RF STERILE (DRAPES) ×4 IMPLANT
DECANTER SPIKE VIAL GLASS SM (MISCELLANEOUS) ×3 IMPLANT
DERMABOND ADVANCED (GAUZE/BANDAGES/DRESSINGS) ×2
DERMABOND ADVANCED .7 DNX12 (GAUZE/BANDAGES/DRESSINGS) ×2 IMPLANT
ELECT REM PT RETURN 9FT ADLT (ELECTROSURGICAL) ×4
ELECTRODE REM PT RTRN 9FT ADLT (ELECTROSURGICAL) ×2 IMPLANT
GAUZE 4X4 16PLY RFD (DISPOSABLE) ×3 IMPLANT
GLOVE BIO SURGEON STRL SZ7.5 (GLOVE) ×4 IMPLANT
GLOVE BIOGEL PI IND STRL 6.5 (GLOVE) ×2 IMPLANT
GLOVE BIOGEL PI INDICATOR 6.5 (GLOVE) ×4
GLOVE SURG SS PI 6.5 STRL IVOR (GLOVE) ×3 IMPLANT
GLOVE SURG SS PI 7.0 STRL IVOR (GLOVE) ×3 IMPLANT
GLOVE SURG SS PI 7.5 STRL IVOR (GLOVE) ×3 IMPLANT
GOWN STRL REUS W/ TWL LRG LVL3 (GOWN DISPOSABLE) ×4 IMPLANT
GOWN STRL REUS W/ TWL XL LVL3 (GOWN DISPOSABLE) ×2 IMPLANT
GOWN STRL REUS W/TWL LRG LVL3 (GOWN DISPOSABLE) ×8
GOWN STRL REUS W/TWL XL LVL3 (GOWN DISPOSABLE) ×4
KIT BASIN OR (CUSTOM PROCEDURE TRAY) ×4 IMPLANT
KIT TURNOVER KIT B (KITS) ×4 IMPLANT
NS IRRIG 1000ML POUR BTL (IV SOLUTION) ×4 IMPLANT
PACK CV ACCESS (CUSTOM PROCEDURE TRAY) ×4 IMPLANT
PAD ARMBOARD 7.5X6 YLW CONV (MISCELLANEOUS) ×8 IMPLANT
SUT MNCRL AB 4-0 PS2 18 (SUTURE) ×4 IMPLANT
SUT PROLENE 5 0 C 1 24 (SUTURE) ×3 IMPLANT
SUT PROLENE 6 0 BV (SUTURE) ×4 IMPLANT
SUT SILK 2 0 SH (SUTURE) ×3 IMPLANT
SUT VIC AB 3-0 SH 27 (SUTURE) ×4
SUT VIC AB 3-0 SH 27X BRD (SUTURE) ×2 IMPLANT
TOWEL GREEN STERILE (TOWEL DISPOSABLE) ×4 IMPLANT
UNDERPAD 30X30 (UNDERPADS AND DIAPERS) ×4 IMPLANT
WATER STERILE IRR 1000ML POUR (IV SOLUTION) ×4 IMPLANT

## 2019-06-30 NOTE — Transfer of Care (Signed)
Immediate Anesthesia Transfer of Care Note  Patient: Margaret Aguilar  Procedure(s) Performed: Bascilic Vein Transposition (Left Arm Upper)  Patient Location: PACU  Anesthesia Type:General  Level of Consciousness: drowsy  Airway & Oxygen Therapy: Patient Spontanous Breathing and Patient connected to face mask oxygen  Post-op Assessment: Report given to RN and Post -op Vital signs reviewed and stable  Post vital signs: Reviewed  Last Vitals:  Vitals Value Taken Time  BP 120/74 06/30/19 0911  Temp    Pulse 61 06/30/19 0914  Resp 19 06/30/19 0914  SpO2 100 % 06/30/19 0914  Vitals shown include unvalidated device data.  Last Pain: There were no vitals filed for this visit.    Patients Stated Pain Goal: 5 (23/55/73 2202)  Complications: No apparent anesthesia complications

## 2019-06-30 NOTE — Discharge Instructions (Signed)
° °  [facility] ° °Discharge Instructions ° °AV Fistula or Graft Surgery for Dialysis Access ° °Please refer to the following instructions for your post-procedure care. Your surgeon or physician assistant will discuss any changes with you. ° °Activity ° °You may drive the day following your surgery, if you are comfortable and no longer taking prescription pain medication. Resume full activity as the soreness in your incision resolves. ° °Bathing/Showering ° °You may shower after you go home. Keep your incision dry for 48 hours. Do not soak in a bathtub, hot tub, or swim until the incision heals completely. You may not shower if you have a hemodialysis catheter. ° °Incision Care ° °Clean your incision with mild soap and water after 48 hours. Pat the area dry with a clean towel. You do not need a bandage unless otherwise instructed. Do not apply any ointments or creams to your incision. You may have skin glue on your incision. Do not peel it off. It will come off on its own in about one week. Your arm may swell a bit after surgery. To reduce swelling use pillows to elevate your arm so it is above your heart. Your doctor will tell you if you need to lightly wrap your arm with an ACE bandage. ° °Diet ° °Resume your normal diet. There are not special food restrictions following this procedure. In order to heal from your surgery, it is CRITICAL to get adequate nutrition. Your body requires vitamins, minerals, and protein. Vegetables are the best source of vitamins and minerals. Vegetables also provide the perfect balance of protein. Processed food has little nutritional value, so try to avoid this. ° °Medications ° °Resume taking all of your medications. If your incision is causing pain, you may take over-the counter pain relievers such as acetaminophen (Tylenol). If you were prescribed a stronger pain medication, please be aware these medications can cause nausea and constipation. Prevent  nausea by taking the medication with a snack or meal. Avoid constipation by drinking plenty of fluids and eating foods with high amount of fiber, such as fruits, vegetables, and grains. Do not take Tylenol if you are taking prescription pain medications. ° ° ° ° °Follow up °Your surgeon may want to see you in the office following your access surgery. If so, this will be arranged at the time of your surgery. ° °Please call us immediately for any of the following conditions: ° °Increased pain, redness, drainage (pus) from your incision site °Fever of 101 degrees or higher °Severe or worsening pain at your incision site °Hand pain or numbness. ° °Reduce your risk of vascular disease: ° °Stop smoking. If you would like help, call [facility] at 1-800-QUIT-NOW (1-800-784-8669) or [place] at [phone] ° °Manage your cholesterol °Maintain a desired weight °Control your diabetes °Keep your blood pressure down ° °Dialysis ° °It will take several weeks to several months for your new dialysis access to be ready for use. Your surgeon will determine when it is OK to use it. Your nephrologist will continue to direct your dialysis. You can continue to use your Permcath until your new access is ready for use. ° °If you have any questions, please call the office at [phone]. ° °

## 2019-06-30 NOTE — H&P (Signed)
History of Present Illness  Margaret Aguilar is a 59 y.o. (11-Sep-1960) female who is s/p Left arm cephalic vein AV fistula creation on 10-19-18 by Dr. Donzetta Matters.  She returns today for 6 weeks duplex follow up of AVF.  She reports mild occasional tingling and numbness in her left hand.  She is right hand dominant.  She has not yet started hemodialysis, and states that her nephrologist has not indicated that she needs to start any time soon.  She states that Dr. [name] is aware of her irregular heart rhythm and occasional bradycardia.      Past Medical History:  Diagnosis Date  . Anemia   . Anxiety   . Arthritis   . Asthma   . Bradycardia   . Diabetes mellitus without complication ([place])    x 5 yrs  . Herpes   . Hypertension   . Kidney disease    Stage 4  . 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 Pneumonia   . Sleep apnea    hasn't gotten cpap yet   Social History  Social History        Tobacco Use  . Smoking status: Never Smoker  . Smokeless tobacco: Never Used  Substance Use Topics  . Alcohol use: No  . Drug use: Yes    Types: Marijuana    Comment: occasional    Family History       Family History  Problem Relation Age of Onset  . Diabetes Mother   . Hyperlipidemia Mother   . Hyperlipidemia Father   . Hypertension Father   . Stroke Brother   . Diabetes Brother    Surgical History       Past Surgical History:  Procedure Laterality Date  . AV FISTULA PLACEMENT Left 10/19/2018   Procedure: LEFT ARTERIOVENOUS (AV) FISTULA CREATION; Surgeon: Waynetta Sandy, MD; Location: [place]; Service: Vascular; Laterality: Left;  . CESAREAN SECTION    . COLONOSCOPY N/A 01/12/2014   Procedure: COLONOSCOPY; Surgeon: Rogene Houston, MD; [facility]; Service: Endoscopy; Laterality: N/A; 225  . TUBAL LIGATION          Allergies  Allergen Reactions  . Nutritional Supplements     walnut  . Latex          Current  Outpatient Medications  Medication Sig Dispense Refill  . acetaminophen (TYLENOL) 500 MG tablet Take 2 tablets (1,000 mg total) by mouth every 6 (six) hours as needed. 30 tablet 0  . albuterol (PROVENTIL HFA;VENTOLIN HFA) 108 (90 Base) MCG/ACT inhaler Inhale 2 puffs into the lungs every 6 (six) hours as needed for wheezing or shortness of breath. 1 Inhaler 11  . BIDIL 20-37.5 MG tablet Take 1 tablet by mouth 3 (three) times daily.    . Blood Glucose Monitoring Suppl (TRUE METRIX AIR GLUCOSE METER) w/Device KIT 1 Device by Does not apply route 2 (two) times daily at 8 am and 10 pm. 1 kit 0  . glucose blood (TRUE METRIX BLOOD GLUCOSE TEST) test strip Use as instructed to check blood sugars twice per day 100 each 6  . metoprolol tartrate (LOPRESSOR) 25 MG tablet Take 25 mg by mouth 2 (two) times daily.    . TRUEPLUS LANCETS 28G MISC Use to check blood sugar twice per day 100 each 6  . amLODipine (NORVASC) 5 MG tablet Take 1 tablet (5 mg total) by mouth daily for 30 days. 30 tablet 0  . atorvastatin (  LIPITOR) 40 MG tablet Take 1 tablet (40 mg total) by mouth daily at 6 PM for 30 days. 30 tablet 0  . colchicine 0.6 MG tablet Take 0.5 tablets (0.3 mg total) by mouth daily for 30 days. 15 tablet 0   No current facility-administered medications for this visit.    REVIEW OF SYSTEMS: see HPI for pertinent positives and negatives   Vitals:   06/30/19 0556  BP: 140/75  Pulse: (!) 58  Resp: 20  Temp: 99.1 F (37.3 C)  SpO2: 100%    Body mass index is 38.45 kg/m.  General: Obese female in NAD  HEENT: No gross abnormalities  Pulmonary: Respirations are non-labored  Abdomen: Soft and non-tender  Musculoskeletal: There are no major deformities.  Neurologic: No focal weakness or paresthesias are detected, bilateral hand grip strength is 5/5  Skin: There are no ulcer or rashes noted.  Psychiatric: The patient has normal affect.  Cardiovascular: There is an irregular rhythm with controlled rate    Left radial pulse is faintly palpable  Left upper arm AVF with noted thrill and bruit distally, is palpable more proximally  Non-Invasive Vascular Imaging  Left Upper Arm AVF Duplex (12-24-18):  Findings:  +--------------------+----------+-----------------+--------+  AVF PSV (cm/s)Flow Vol (mL/min)Comments  +--------------------+----------+-----------------+--------+  Native artery inflow 179  1626    +--------------------+----------+-----------------+--------+  AVF Anastomosis  281     +--------------------+----------+-----------------+--------+  +------------+----------+-------------+----------+------------------+  OUTFLOW VEINPSV (cm/s)Diameter (cm)Depth (cm) Describe   +------------+----------+-------------+----------+------------------+  Shoulder  132  0.69  2.67    +------------+----------+-------------+----------+------------------+  Prox UA  366  0.60  1.59 change in Diameter  +------------+----------+-------------+----------+------------------+  Mid UA  142  0.90  0.77  competing branch   +------------+----------+-------------+----------+------------------+  Dist UA  208  0.97  0.42    +------------+----------+-------------+----------+------------------+  AC Fossa  263  1.33  0.44    +------------+----------+-------------+----------+------------------+  Outflow vein becomes tortuous in the proximal-mid upper arm.  Summary:  Patent left brachiocephalic fistula with no obvious evidence of stenosis.    A/P Margaret Aguilar is a 59 y.o. female who is s/p Left arm cephalic vein AV fistula creation on 10-19-18 by Dr. Donzetta Matters.  She has not yet started hemodialysis, and it is not imminent.  AVF duplex today shows large enough diameters, but is too deep proximally and mid upper arm.   Plan for transposition vs superficialization.   Olufemi Mofield C. Donzetta Matters, MD [facility] Office: [phone] Pager:  [phone]

## 2019-06-30 NOTE — Anesthesia Procedure Notes (Signed)
Procedure Name: LMA Insertion Date/Time: 06/30/2019 7:38 AM Performed by: Janene [name], CRNA Pre-anesthesia Checklist: Patient identified, Emergency Drugs available, Suction available and Patient being monitored Patient Re-evaluated:Patient Re-evaluated prior to induction Oxygen Delivery Method: Circle system utilized Preoxygenation: Pre-oxygenation with 100% oxygen Induction Type: IV induction LMA: LMA inserted LMA Size: 4.0 Dental Injury: Teeth and Oropharynx as per pre-operative assessment

## 2019-06-30 NOTE — Anesthesia Procedure Notes (Signed)
Procedure Name: Intubation Date/Time: 06/30/2019 7:50 AM Performed by: Janene [name], CRNA Pre-anesthesia Checklist: Patient identified, Emergency Drugs available, Suction available and Patient being monitored Patient Re-evaluated:Patient Re-evaluated prior to induction Oxygen Delivery Method: Circle system utilized Preoxygenation: Pre-oxygenation with 100% oxygen Induction Type: IV induction Laryngoscope Size: Mac and 4 Grade View: Grade I Tube type: Oral Tube size: 7.5 mm Number of attempts: 1 Airway Equipment and Method: Stylet and Oral airway Placement Confirmation: ETT inserted through vocal cords under direct vision,  positive ETCO2 and breath sounds checked- equal and bilateral Secured at: 22 cm Tube secured with: Tape Dental Injury: Teeth and Oropharynx as per pre-operative assessment

## 2019-06-30 NOTE — Op Note (Signed)
    Patient name: Margaret Aguilar MRN: [id number] DOB: 08-10-1960 Sex: female  06/30/2019 Pre-operative Diagnosis: Chronic kidney disease Post-operative diagnosis:  Same Surgeon:  Eda Paschal. Donzetta Matters, MD Assistant: Paulo Fruit, PA Procedure Performed:  Revision of left arm AV fistula with transposition of cephalic vein   Indications: 59 year old female with history of chronic kidney disease.  She has had a cephalic vein fistula placed in the left upper extremity this is too deep and tortuous for use.  She is now indicated for revision.  Findings: Fistula self measured greater than a centimeter throughout its course.  It was quite tortuous with a few very tight bands.  In the upper arm it is approximately 3 cm deep.  After transposition there is a strong thrill and her hand was well-perfused.   Procedure:  The patient was identified in the holding area and taken to the operating where she was put supine upper table and general anesthesia was induced.  She was sterilely prepped draped in the left upper extremity usual fashion antibiotics were minister timeout was called.  Ultrasound was used to mark out the fistula as well as branches on the skin.  There is anesthetized 1% lidocaine.  2 transverse incisions were made.  We dissected down to the fascia.  Branches were taken between clips and ties.  We had mobilized the entire fistula we clamped it near the antecubitum.  We transected 1 area that was quite tortuous.  We then marked for orientation flushed with heparinized saline.  We tunneled laterally.  Again flushed with heparinized saline and clamped.  We spatulated both ends and then sewed the fistula and and with 6-0 Prolene suture.  Prior to completion we allowed flushing all directions.  Upon completion there was a very strong thrill.  There is a palpable brachial artery pulse distal to the fistula in her forearm.  Satisfied with this we obtain hemostasis irrigated the wounds closed in layers of  Vicryl and Monocryl.  Dermabond is placed at the level of the skin.  She was awakened from anesthesia having tolerated procedure well any complication.  All counts were correct at completion.  EBL: 50 cc    Elis Rawlinson C. Donzetta Matters, MD [facility] Office: [phone] Pager: [phone]

## 2019-06-30 NOTE — Anesthesia Preprocedure Evaluation (Signed)
Anesthesia Evaluation  Patient identified by MRN, date of birth, ID band Patient awake    Reviewed: Allergy & Precautions, H&P , NPO status , Patient's Chart, lab work & pertinent test results, reviewed documented beta blocker date and time   History of Anesthesia Complications Negative for: history of anesthetic complications  Airway Mallampati: II  TM Distance: >3 FB Neck ROM: Full    Dental  (+) Poor Dentition, Partial Upper, Dental Advisory Given   Pulmonary neg pulmonary ROS, asthma , sleep apnea ,    breath sounds clear to auscultation       Cardiovascular Exercise Tolerance: Good hypertension, Pt. on medications  Rhythm:Regular  1. The left ventricle has normal systolic function, with an ejection  fraction of 60-65%. The cavity size was normal. There is mildly increased  left ventricular wall thickness. Left ventricular diastolic parameters  were normal.  2. The right ventricle has normal systolc function. The cavity was  normal. There is no increase in right ventricular wall thickness.  3. Left atrial size was moderately dilated.  4. Mild thickening of the mitral valve leaflet.  5. The aortic valve is tricuspid Mild thickening of the aortic valve Mild  calcification of the aortic valve. Aortic valve regurgitation is mild by  color flow Doppler.  6. Mild pulmonic stenosis.  7. The aortic root is normal in size and structure.    Neuro/Psych PSYCHIATRIC DISORDERS Anxiety Depression negative neurological ROS     GI/Hepatic Neg liver ROS, GERD  ,  Endo/Other  diabetesMorbid obesity  Renal/GU ESRFRenal disease  negative genitourinary   Musculoskeletal  (+) Arthritis , Osteoarthritis,    Abdominal   Peds  Hematology  (+) Blood dyscrasia, anemia ,   Anesthesia Other Findings   Reproductive/Obstetrics negative OB ROS                             Anesthesia Physical Anesthesia  Plan  ASA: III  Anesthesia Plan: General   Post-op Pain Management:    Induction: Intravenous  PONV Risk Score and Plan: 3 and Ondansetron and Dexamethasone  Airway Management Planned: LMA  Additional Equipment: None  Intra-op Plan:   Post-operative Plan: Extubation in OR  Informed Consent: I have reviewed the patients History and Physical, chart, labs and discussed the procedure including the risks, benefits and alternatives for the proposed anesthesia with the patient or authorized representative who has indicated his/her understanding and acceptance.     Dental advisory given  Plan Discussed with: CRNA and Surgeon  Anesthesia Plan Comments:         Anesthesia Quick Evaluation

## 2019-07-04 NOTE — Anesthesia Postprocedure Evaluation (Signed)
Anesthesia Post Note  Patient: Margaret Aguilar  Procedure(s) Performed: Bascilic Vein Transposition (Left Arm Upper)     Patient location during evaluation: PACU Anesthesia Type: General Level of consciousness: awake and alert Pain management: pain level controlled Vital Signs Assessment: post-procedure vital signs reviewed and stable Respiratory status: spontaneous breathing, nonlabored ventilation, respiratory function stable and patient connected to nasal cannula oxygen Cardiovascular status: blood pressure returned to baseline and stable Postop Assessment: no apparent nausea or vomiting Anesthetic complications: no    Last Vitals:  Vitals:   06/30/19 0925 06/30/19 0940  BP: 115/75 124/76  Pulse: 60 61  Resp: 14 20  Temp:  36.6 C  SpO2: 96% 95%    Last Pain:  Vitals:   06/30/19 0940  PainSc: Asleep   Pain Goal: Patients Stated Pain Goal: 3 (06/30/19 0925)                 Samyah Bilbo

## 2019-07-20 ENCOUNTER — Telehealth (HOSPITAL_COMMUNITY): Payer: Self-pay

## 2019-07-20 NOTE — Telephone Encounter (Signed)

## 2019-07-21 DIAGNOSIS — M109 Gout, unspecified: Secondary | ICD-10-CM | POA: Diagnosis not present

## 2019-07-21 DIAGNOSIS — I129 Hypertensive chronic kidney disease with stage 1 through stage 4 chronic kidney disease, or unspecified chronic kidney disease: Secondary | ICD-10-CM | POA: Diagnosis not present

## 2019-07-21 DIAGNOSIS — N189 Chronic kidney disease, unspecified: Secondary | ICD-10-CM | POA: Diagnosis not present

## 2019-07-21 DIAGNOSIS — N2581 Secondary hyperparathyroidism of renal origin: Secondary | ICD-10-CM | POA: Diagnosis not present

## 2019-07-21 DIAGNOSIS — N184 Chronic kidney disease, stage 4 (severe): Secondary | ICD-10-CM | POA: Diagnosis not present

## 2019-07-21 DIAGNOSIS — E1129 Type 2 diabetes mellitus with other diabetic kidney complication: Secondary | ICD-10-CM | POA: Diagnosis not present

## 2019-07-22 NOTE — Progress Notes (Deleted)
  POST OPERATIVE OFFICE NOTE    CC:  F/u for surgery  HPI:  This is a 59 y.o. female who is s/p Basilic fistula transposition.  She is here today for f/u exam.  She denise pain, loss of sensation and loss of motor in the left hand.   She has not started HD yet.  Allergies  Allergen Reactions  . Nutritional Supplements     Walnut >> UNSPECIFIED REACTION  Severity from [place]  . Latex Swelling and Rash    SWELLING REACTION UNSPECIFIED     Current Outpatient Medications  Medication Sig Dispense Refill  . acetaminophen (TYLENOL) 500 MG tablet Take 2 tablets (1,000 mg total) by mouth every 6 (six) hours as needed. (Patient taking differently: Take 1,000 mg by mouth every 6 (six) hours as needed for moderate pain. ) 30 tablet 0  . albuterol (VENTOLIN HFA) 108 (90 Base) MCG/ACT inhaler Inhale 1-2 puffs into the lungs every 6 (six) hours as needed for wheezing or shortness of breath. 18 g 0  . allopurinol (ZYLOPRIM) 100 MG tablet Take 100 mg by mouth daily.    [name] amLODipine (NORVASC) 5 MG tablet Take 1 tablet (5 mg total) by mouth daily for 30 days. (Patient taking differently: Take 10 mg by mouth daily. ) 30 tablet 0  . amoxicillin (AMOXIL) 500 MG tablet Take 500 mg by mouth every 8 (eight) hours as needed.    [name] atorvastatin (LIPITOR) 40 MG tablet Take 1 tablet (40 mg total) by mouth daily at 6 PM for 30 days. 30 tablet 0  . BIDIL 20-37.5 MG tablet Take 1 tablet by mouth in the morning and at bedtime.     . Blood Glucose Monitoring Suppl (TRUE METRIX AIR GLUCOSE METER) w/Device KIT 1 Device by Does not apply route 2 (two) times daily at 8 am and 10 pm. 1 kit 0  . glucose blood (TRUE METRIX BLOOD GLUCOSE TEST) test strip Use as instructed to check blood sugars twice per day 100 each 6  . metoprolol succinate (TOPROL-XL) 100 MG 24 hr tablet Take 50 mg by mouth daily. Take with or immediately following a meal.    . oxyCODONE-acetaminophen (PERCOCET) 5-325 MG tablet Take 1 tablet by mouth every 4  (four) hours as needed for severe pain. 20 tablet 0  . TRUEPLUS LANCETS 28G MISC Use to check blood sugar twice per day 100 each 6   No current facility-administered medications for this visit.     ROS:  See HPI  Physical Exam:  ***  Incision:  *** Extremities:  *** Neuro: *** Abdomen:  ***  Assessment/Plan:  This is a 59 y.o. female who is s/p: ***  -***   *** [facility] [phone]  Clinic MD:  ***

## 2019-07-24 ENCOUNTER — Encounter (HOSPITAL_COMMUNITY): Payer: Self-pay | Admitting: Emergency Medicine

## 2019-07-24 ENCOUNTER — Other Ambulatory Visit: Payer: Self-pay

## 2019-07-24 ENCOUNTER — Emergency Department (HOSPITAL_COMMUNITY)
Admission: EM | Admit: 2019-07-24 | Discharge: 2019-07-24 | Disposition: A | Discharge status: Home / Self Care | Payer: Medicaid Other | Attending: Emergency Medicine | Admitting: Emergency Medicine

## 2019-07-24 DIAGNOSIS — Z91040 Latex allergy status: Secondary | ICD-10-CM | POA: Diagnosis not present

## 2019-07-24 DIAGNOSIS — I129 Hypertensive chronic kidney disease with stage 1 through stage 4 chronic kidney disease, or unspecified chronic kidney disease: Secondary | ICD-10-CM | POA: Insufficient documentation

## 2019-07-24 DIAGNOSIS — Y929 Unspecified place or not applicable: Secondary | ICD-10-CM | POA: Diagnosis not present

## 2019-07-24 DIAGNOSIS — Y999 Unspecified external cause status: Secondary | ICD-10-CM | POA: Diagnosis not present

## 2019-07-24 DIAGNOSIS — S90862A Insect bite (nonvenomous), left foot, initial encounter: Secondary | ICD-10-CM | POA: Diagnosis not present

## 2019-07-24 DIAGNOSIS — Z79899 Other long term (current) drug therapy: Secondary | ICD-10-CM | POA: Insufficient documentation

## 2019-07-24 DIAGNOSIS — W57XXXA Bitten or stung by nonvenomous insect and other nonvenomous arthropods, initial encounter: Secondary | ICD-10-CM | POA: Diagnosis not present

## 2019-07-24 DIAGNOSIS — Y939 Activity, unspecified: Secondary | ICD-10-CM | POA: Insufficient documentation

## 2019-07-24 DIAGNOSIS — E1122 Type 2 diabetes mellitus with diabetic chronic kidney disease: Secondary | ICD-10-CM | POA: Insufficient documentation

## 2019-07-24 DIAGNOSIS — N184 Chronic kidney disease, stage 4 (severe): Secondary | ICD-10-CM | POA: Diagnosis not present

## 2019-07-24 MED ORDER — LORATADINE 10 MG PO TABS: 10.0000 mg | ORAL_TABLET | Freq: Every day | ORAL | 0 refills | Status: DC

## 2019-07-24 MED ORDER — DIPHENHYDRAMINE HCL 25 MG PO CAPS: 25.0000 mg | ORAL_CAPSULE | Freq: Once | ORAL | Status: DC

## 2019-07-24 MED ORDER — LORATADINE 10 MG PO TABS
10.0000 mg | ORAL_TABLET | Freq: Once | ORAL | Status: AC
  Administered 2019-07-24: 10 mg via ORAL
  Filled 2019-07-24: qty 1

## 2019-07-24 MED ORDER — DEXAMETHASONE SODIUM PHOSPHATE 4 MG/ML IJ SOLN: 4.0000 mg | Freq: Once | INTRAMUSCULAR | Status: DC

## 2019-07-24 MED ORDER — PREDNISONE 20 MG PO TABS
60.0000 mg | ORAL_TABLET | Freq: Once | ORAL | Status: AC
  Administered 2019-07-24: 60 mg via ORAL
  Filled 2019-07-24: qty 3

## 2019-07-24 MED ORDER — PREDNISONE 20 MG PO TABS: ORAL_TABLET | ORAL | 0 refills | Status: DC

## 2019-07-24 MED ORDER — FAMOTIDINE 20 MG PO TABS
20.0000 mg | ORAL_TABLET | Freq: Once | ORAL | Status: AC
  Administered 2019-07-24: 20 mg via ORAL
  Filled 2019-07-24: qty 1

## 2019-07-24 MED ORDER — ACETAMINOPHEN 500 MG PO TABS
1000.0000 mg | ORAL_TABLET | Freq: Once | ORAL | Status: AC
  Administered 2019-07-24: 1000 mg via ORAL
  Filled 2019-07-24: qty 2

## 2019-07-24 NOTE — ED Triage Notes (Signed)
Pt reports being stung by wasp or hornet @ 45 min ago, pt c/o swelling,itching and pain to L foot.

## 2019-07-24 NOTE — ED Provider Notes (Signed)
[facility] EMERGENCY DEPARTMENT Provider Note   CSN: [id number] Arrival date & time: 07/24/19  0086     History Chief Complaint  Patient presents with  . Insect Bite    Margaret [facility] is a 59 y.o. female.  The history is provided by the patient.  Animal Bite Contact animal:  Insect Location:  Foot Foot injury location:  Sole of L foot Pain details:    Quality:  Aching   Severity:  Severe   Timing:  Constant   Progression:  Unchanged Incident location:  Home Provoked: unprovoked   Notifications:  None Animal in possession: no   Relieved by:  Nothing Worsened by:  Nothing Ineffective treatments:  None tried Associated symptoms: no fever and no rash   States she stepped on a bee on her carpet.      Past Medical History:  Diagnosis Date  . Anemia   . Anxiety   . Arthritis   . Asthma   . Bradycardia   . Depression   . Diabetes mellitus without complication ([place])    x 5 yrs  . GERD (gastroesophageal reflux disease)   . Gout   . Herpes   . HLD (hyperlipidemia)   . Hypertension   . Kidney disease    Stage 4  . 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 Pneumonia    several  . Sleep apnea    hasn't gotten cpap yet    Patient Active Problem List   Diagnosis Date Noted  . Bradycardia 08/16/2018  . Gout attack 08/09/2018  . Acute on chronic renal failure ([place]) 08/08/2018  . CKD (chronic kidney disease) stage 4, GFR 15-29 ml/min (HCC) 01/09/2017  . Spondylosis of lumbosacral region 10/09/2016  . Type 2 diabetes mellitus, without long-term current use of insulin ([place]) 09/18/2016  . IDA (iron deficiency anemia) 12/14/2013  . Guaiac + stool 12/14/2013  . Hypertension 09/28/2013  . Diabetes ([place]) 09/28/2013  . Renal insufficiency 09/28/2013  . Morbid obesity ([place]) 09/28/2013  . Obstructive sleep apnea on CPAP 06/05/2011  . Hyperlipidemia 06/05/2011  . Gastroesophageal reflux disease 06/05/2011  .  Arthritis 06/05/2011    Past Surgical History:  Procedure Laterality Date  . AV FISTULA PLACEMENT Left 10/19/2018   Procedure: LEFT ARTERIOVENOUS (AV) FISTULA CREATION;  Surgeon: Waynetta Sandy, MD;  Location: [facility];  Service: Vascular;  Laterality: Left;  . BASCILIC VEIN TRANSPOSITION Left 06/30/2019   Procedure: Bascilic Vein Transposition;  Surgeon: Waynetta Sandy, MD;  Location: [place];  Service: Vascular;  Laterality: Left;  . CESAREAN SECTION     x 1  . COLONOSCOPY N/A 01/12/2014   Procedure: COLONOSCOPY;  Surgeon: Rogene Houston, MD;  [facility];  Service: Endoscopy;  Laterality: N/A;  225  . TUBAL LIGATION       OB History   No obstetric history on file.     Family History  Problem Relation Age of Onset  . Diabetes Mother   . Hyperlipidemia Mother   . Hyperlipidemia Father   . Hypertension Father   . Stroke Brother   . Diabetes Brother     Social History   Tobacco Use  . Smoking status: Never Smoker  . Smokeless tobacco: Never Used  Substance Use Topics  . Alcohol use: No  . Drug use: Yes    Types: Marijuana    Home Medications Prior to Admission medications   Medication Sig Start Date End Date  Taking? Authorizing Provider  acetaminophen (TYLENOL) 500 MG tablet Take 2 tablets (1,000 mg total) by mouth every 6 (six) hours as needed. Patient taking differently: Take 1,000 mg by mouth every 6 (six) hours as needed for moderate pain.  08/11/18   Johnson, Clanford L, MD  albuterol (VENTOLIN HFA) 108 (90 Base) MCG/ACT inhaler Inhale 1-2 puffs into the lungs every 6 (six) hours as needed for wheezing or shortness of breath. 03/28/19   Avegno, Darrelyn Hillock, FNP  allopurinol (ZYLOPRIM) 100 MG tablet Take 100 mg by mouth daily.    [provider]  amLODipine (NORVASC) 5 MG tablet Take 1 tablet (5 mg total) by mouth daily for 30 days. Patient taking differently: Take 10 mg by mouth daily.  08/17/18 06/28/19  Johnson, Clanford L, MD   amoxicillin (AMOXIL) 500 MG tablet Take 500 mg by mouth every 8 (eight) hours as needed.    [provider]  atorvastatin (LIPITOR) 40 MG tablet Take 1 tablet (40 mg total) by mouth daily at 6 PM for 30 days. 08/17/18 09/16/18  Johnson, Clanford L, MD  BIDIL 20-37.5 MG tablet Take 1 tablet by mouth in the morning and at bedtime.  09/28/18   [provider]  Blood Glucose Monitoring Suppl (TRUE METRIX AIR GLUCOSE METER) w/Device KIT 1 Device by Does not apply route 2 (two) times daily at 8 am and 10 pm. 01/12/18   Fulp, Cammie, MD  glucose blood (TRUE METRIX BLOOD GLUCOSE TEST) test strip Use as instructed to check blood sugars twice per day 01/12/18   Fulp, Cammie, MD  metoprolol succinate (TOPROL-XL) 100 MG 24 hr tablet Take 50 mg by mouth daily. Take with or immediately following a meal.    [provider]  oxyCODONE-acetaminophen (PERCOCET) 5-325 MG tablet Take 1 tablet by mouth every 4 (four) hours as needed for severe pain. 06/30/19 06/29/20  Karoline Caldwell, PA-C  TRUEPLUS LANCETS 28G MISC Use to check blood sugar twice per day 01/12/18   Antony Blackbird, MD    Allergies    Nutritional supplements and Latex  Review of Systems   Review of Systems  Constitutional: Negative for fever.  HENT: Negative for congestion, drooling and facial swelling.   Eyes: Negative for visual disturbance.  Respiratory: Negative for shortness of breath.   Cardiovascular: Negative for chest pain.  Gastrointestinal: Negative for abdominal pain.  Genitourinary: Negative for difficulty urinating.  Musculoskeletal: Negative for back pain.  Skin: Negative for rash and wound.  Neurological: Negative for dizziness.  Psychiatric/Behavioral: Negative for agitation.  All other systems reviewed and are negative.   Physical Exam Updated Vital Signs BP (!) 194/89 (BP Location: Right Arm)   Pulse 68   Temp 98 F (36.7 C) (Oral)   Resp 20   LMP  (LMP Unknown)   SpO2 96%   Physical Exam Vitals  and nursing note reviewed.  Constitutional:      General: She is not in acute distress.    Appearance: Normal appearance.  HENT:     Head: Normocephalic and atraumatic.     Nose: Nose normal.  Eyes:     Conjunctiva/sclera: Conjunctivae normal.     Pupils: Pupils are equal, round, and reactive to light.  Cardiovascular:     Rate and Rhythm: Normal rate and regular rhythm.     Pulses: Normal pulses.     Heart sounds: Normal heart sounds.  Pulmonary:     Effort: Pulmonary effort is normal.     Breath sounds: Normal breath  sounds. No stridor.  Abdominal:     General: Abdomen is flat. Bowel sounds are normal.     Tenderness: There is no abdominal tenderness.  Musculoskeletal:        General: No swelling or tenderness. Normal range of motion.     Cervical back: Normal range of motion and neck supple.  Skin:    General: Skin is warm and dry.     Capillary Refill: Capillary refill takes less than 2 seconds.     Findings: No rash.     Comments: No swelling of the foot no signs of envenomation foot cleaned thoroughly with iodine  Neurological:     General: No focal deficit present.     Mental Status: She is alert and oriented to person, place, and time.  Psychiatric:        Mood and Affect: Mood normal.        Behavior: Behavior normal.     ED Results / Procedures / Treatments   Labs (all labs ordered are listed, but only abnormal results are displayed) Labs Reviewed - No data to display  EKG None  Radiology No results found.  Procedures Procedures (including critical care time)  Medications Ordered in ED Medications  famotidine (PEPCID) tablet 20 mg (has no administration in time range)  acetaminophen (TYLENOL) tablet 1,000 mg (has no administration in time range)  predniSONE (DELTASONE) tablet 60 mg (has no administration in time range)  loratadine (CLARITIN) tablet 10 mg (has no administration in time range)    ED Course  I have reviewed the triage vital signs and  the nursing notes.  Pertinent labs & imaging results that were available during my care of the patient were reviewed by me and considered in my medical decision making (see chart for details).  Will start steroids and claritin.  No signs of envenomation nor swelling. Tylenol for pain.    Jorja Empie Ballew was evaluated in Emergency Department on 07/24/2019 for the symptoms described in the history of present illness. She was evaluated in the context of the global COVID-19 pandemic, which necessitated consideration that the patient might be at risk for infection with the SARS-CoV-2 virus that causes COVID-19. Institutional protocols and algorithms that pertain to the evaluation of patients at risk for COVID-19 are in a state of rapid change based on information released by regulatory bodies including the CDC and federal and state organizations. These policies and algorithms were followed during the patient's care in the ED.  Final Clinical Impression(s) / ED Diagnoses  Return for weakness, numbness, changes in vision or speech, fevers >100.4 unrelieved by medication, shortness of breath, intractable vomiting, or diarrhea, abdominal pain, Inability to tolerate liquids or food, cough, altered mental status or any concerns. No signs of systemic illness or infection. The patient is nontoxic-appearing on exam and vital signs are within normal limits.   I have reviewed the triage vital signs and the nursing notes. Pertinent labs &imaging results that were available during my care of the patient were reviewed by me and considered in my medical decision making (see chart for details).  After history, exam, and medical workup I feel the patient has been appropriately medically screened and is safe for discharge home. Pertinent diagnoses were discussed with the patient. Patient was givenstrictreturn precautions.    Ervin Hensley, MD 07/24/19 [phone]

## 2019-08-03 DIAGNOSIS — N189 Chronic kidney disease, unspecified: Secondary | ICD-10-CM | POA: Diagnosis not present

## 2019-08-03 DIAGNOSIS — G4733 Obstructive sleep apnea (adult) (pediatric): Secondary | ICD-10-CM | POA: Diagnosis not present

## 2019-08-03 DIAGNOSIS — I10 Essential (primary) hypertension: Secondary | ICD-10-CM | POA: Diagnosis not present

## 2019-08-03 DIAGNOSIS — E785 Hyperlipidemia, unspecified: Secondary | ICD-10-CM | POA: Diagnosis not present

## 2019-08-03 DIAGNOSIS — E119 Type 2 diabetes mellitus without complications: Secondary | ICD-10-CM | POA: Diagnosis not present

## 2019-08-22 DIAGNOSIS — D631 Anemia in chronic kidney disease: Secondary | ICD-10-CM | POA: Diagnosis not present

## 2019-08-22 DIAGNOSIS — D519 Vitamin B12 deficiency anemia, unspecified: Secondary | ICD-10-CM | POA: Diagnosis not present

## 2019-08-22 DIAGNOSIS — M109 Gout, unspecified: Secondary | ICD-10-CM | POA: Diagnosis not present

## 2019-08-22 DIAGNOSIS — N189 Chronic kidney disease, unspecified: Secondary | ICD-10-CM | POA: Diagnosis not present

## 2019-08-22 DIAGNOSIS — I129 Hypertensive chronic kidney disease with stage 1 through stage 4 chronic kidney disease, or unspecified chronic kidney disease: Secondary | ICD-10-CM | POA: Diagnosis not present

## 2019-08-22 DIAGNOSIS — I770 Arteriovenous fistula, acquired: Secondary | ICD-10-CM | POA: Diagnosis not present

## 2019-08-22 DIAGNOSIS — E785 Hyperlipidemia, unspecified: Secondary | ICD-10-CM | POA: Diagnosis not present

## 2019-08-22 DIAGNOSIS — N2581 Secondary hyperparathyroidism of renal origin: Secondary | ICD-10-CM | POA: Diagnosis not present

## 2019-08-22 DIAGNOSIS — Z6841 Body Mass Index (BMI) 40.0 and over, adult: Secondary | ICD-10-CM | POA: Diagnosis not present

## 2019-08-22 DIAGNOSIS — N184 Chronic kidney disease, stage 4 (severe): Secondary | ICD-10-CM | POA: Diagnosis not present

## 2019-09-29 ENCOUNTER — Ambulatory Visit (INDEPENDENT_AMBULATORY_CARE_PROVIDER_SITE_OTHER): Payer: Self-pay | Admitting: Physician Assistant

## 2019-09-29 ENCOUNTER — Other Ambulatory Visit: Payer: Self-pay

## 2019-09-29 VITALS — BP 116/75 | HR 61 | Temp 97.7°F | Resp 20 | Ht 62.0 in | Wt 225.8 lb

## 2019-09-29 DIAGNOSIS — N184 Chronic kidney disease, stage 4 (severe): Secondary | ICD-10-CM

## 2019-09-29 NOTE — Progress Notes (Addendum)
  POST OPERATIVE OFFICE NOTE    CC:  F/u for surgery; 3+ months post-op  HPI:  This is a 59 y.o. female who is s/p Revision of left arm AV fistula with transposition of cephalic vein by Dr. Donzetta Matters on 06/20/2019.  She is not yet requiring HD. No hand pain.  Left arm cephalic vein AV fistula creation 10/19/2018  Nephrologist: [name]  Allergies  Allergen Reactions  . Nutritional Supplements     Walnut >> UNSPECIFIED REACTION  Severity from [place]  . Latex Swelling and Rash    SWELLING REACTION UNSPECIFIED     Current Outpatient Medications  Medication Sig Dispense Refill  . acetaminophen (TYLENOL) 500 MG tablet Take 2 tablets (1,000 mg total) by mouth every 6 (six) hours as needed. (Patient taking differently: Take 1,000 mg by mouth every 6 (six) hours as needed for moderate pain. ) 30 tablet 0  . albuterol (VENTOLIN HFA) 108 (90 Base) MCG/ACT inhaler Inhale 1-2 puffs into the lungs every 6 (six) hours as needed for wheezing or shortness of breath. 18 g 0  . allopurinol (ZYLOPRIM) 100 MG tablet Take 100 mg by mouth daily.    [name] amoxicillin (AMOXIL) 500 MG tablet Take 500 mg by mouth every 8 (eight) hours as needed.    [name] BIDIL 20-37.5 MG tablet Take 1 tablet by mouth in the morning and at bedtime.     . Blood Glucose Monitoring Suppl (TRUE METRIX AIR GLUCOSE METER) w/Device KIT 1 Device by Does not apply route 2 (two) times daily at 8 am and 10 pm. 1 kit 0  . glucose blood (TRUE METRIX BLOOD GLUCOSE TEST) test strip Use as instructed to check blood sugars twice per day 100 each 6  . loratadine (CLARITIN) 10 MG tablet Take 1 tablet (10 mg total) by mouth daily. 5 tablet 0  . metoprolol succinate (TOPROL-XL) 100 MG 24 hr tablet Take 50 mg by mouth daily. Take with or immediately following a meal.    . TRUEPLUS LANCETS 28G MISC Use to check blood sugar twice per day 100 each 6  . amLODipine (NORVASC) 5 MG tablet Take 1 tablet (5 mg total) by mouth daily for 30 days. (Patient taking  differently: Take 10 mg by mouth daily. ) 30 tablet 0   No current facility-administered medications for this visit.     ROS:  See HPI  LMP  (LMP Unknown)   Physical Exam:  General appearance:WD, WN female in NAD Cardiac:RRR Respiratory:non-labored Incision:  Well healed left upper arm incisions Extremities:  5/5 grip strength. Intact sensation. Good bruit and thrill in fistula. Easily palpated along its course Neuro: A and O X 4   Assessment/Plan:  This is a 59 y.o. female who is s/p: LUE basilic vein AVF transposition. Adequately healed for canalization   Risa Grill, PA-C [facility] [phone]  Clinic MD:  Scot Dock

## 2019-10-21 DIAGNOSIS — N184 Chronic kidney disease, stage 4 (severe): Secondary | ICD-10-CM | POA: Diagnosis not present

## 2019-10-21 DIAGNOSIS — D631 Anemia in chronic kidney disease: Secondary | ICD-10-CM | POA: Diagnosis not present

## 2019-10-21 DIAGNOSIS — N2581 Secondary hyperparathyroidism of renal origin: Secondary | ICD-10-CM | POA: Diagnosis not present

## 2019-10-21 DIAGNOSIS — N185 Chronic kidney disease, stage 5: Secondary | ICD-10-CM | POA: Diagnosis not present

## 2019-10-21 DIAGNOSIS — I120 Hypertensive chronic kidney disease with stage 5 chronic kidney disease or end stage renal disease: Secondary | ICD-10-CM | POA: Diagnosis not present

## 2019-11-02 DIAGNOSIS — I10 Essential (primary) hypertension: Secondary | ICD-10-CM | POA: Diagnosis not present

## 2019-11-02 DIAGNOSIS — R52 Pain, unspecified: Secondary | ICD-10-CM | POA: Diagnosis not present

## 2019-11-03 ENCOUNTER — Encounter (HOSPITAL_COMMUNITY): Payer: Self-pay | Admitting: Emergency Medicine

## 2019-11-03 ENCOUNTER — Emergency Department (HOSPITAL_COMMUNITY): Payer: Medicaid Other

## 2019-11-03 ENCOUNTER — Emergency Department (HOSPITAL_COMMUNITY)
Admission: EM | Admit: 2019-11-03 | Discharge: 2019-11-03 | Disposition: A | Discharge status: Home / Self Care | Payer: Medicaid Other | Attending: Emergency Medicine | Admitting: Emergency Medicine

## 2019-11-03 ENCOUNTER — Other Ambulatory Visit: Payer: Self-pay

## 2019-11-03 DIAGNOSIS — Z91040 Latex allergy status: Secondary | ICD-10-CM | POA: Insufficient documentation

## 2019-11-03 DIAGNOSIS — S99921A Unspecified injury of right foot, initial encounter: Secondary | ICD-10-CM

## 2019-11-03 DIAGNOSIS — J45909 Unspecified asthma, uncomplicated: Secondary | ICD-10-CM | POA: Diagnosis not present

## 2019-11-03 DIAGNOSIS — S90931A Unspecified superficial injury of right great toe, initial encounter: Secondary | ICD-10-CM | POA: Insufficient documentation

## 2019-11-03 DIAGNOSIS — Z79899 Other long term (current) drug therapy: Secondary | ICD-10-CM | POA: Diagnosis not present

## 2019-11-03 DIAGNOSIS — W228XXA Striking against or struck by other objects, initial encounter: Secondary | ICD-10-CM | POA: Diagnosis not present

## 2019-11-03 DIAGNOSIS — E119 Type 2 diabetes mellitus without complications: Secondary | ICD-10-CM | POA: Insufficient documentation

## 2019-11-03 DIAGNOSIS — Y939 Activity, unspecified: Secondary | ICD-10-CM | POA: Insufficient documentation

## 2019-11-03 DIAGNOSIS — M79674 Pain in right toe(s): Secondary | ICD-10-CM | POA: Diagnosis not present

## 2019-11-03 DIAGNOSIS — Y999 Unspecified external cause status: Secondary | ICD-10-CM | POA: Diagnosis not present

## 2019-11-03 DIAGNOSIS — F1590 Other stimulant use, unspecified, uncomplicated: Secondary | ICD-10-CM | POA: Insufficient documentation

## 2019-11-03 DIAGNOSIS — N184 Chronic kidney disease, stage 4 (severe): Secondary | ICD-10-CM | POA: Diagnosis not present

## 2019-11-03 DIAGNOSIS — Y929 Unspecified place or not applicable: Secondary | ICD-10-CM | POA: Insufficient documentation

## 2019-11-03 DIAGNOSIS — I129 Hypertensive chronic kidney disease with stage 1 through stage 4 chronic kidney disease, or unspecified chronic kidney disease: Secondary | ICD-10-CM | POA: Diagnosis not present

## 2019-11-03 MED ORDER — DICLOFENAC SODIUM 1 % EX GEL: 2.0000 g | Freq: Four times a day (QID) | CUTANEOUS | 0 refills | Status: DC

## 2019-11-03 NOTE — Discharge Instructions (Addendum)
  You have been seen today for a toe injury. There were no acute abnormalities on the x-rays, including no sign of fracture or dislocation, however, there could be injuries to the soft tissues, such as the ligaments or tendons that are not seen on xrays. There could also be what are called occult fractures that are small fractures not seen on xray. Acetaminophen: May take acetaminophen (generic for Tylenol), as needed, for pain. Your daily total maximum amount of acetaminophen from all sources should be limited to 4000mg /day for persons without liver problems, or 2000mg /day for those with liver problems. Diclofenac gel: This is a topical anti-inflammatory medication and can be applied directly to the painful region.  Do not use on the face or genitals.  This medication may be used as an alternative to oral anti-inflammatory medications, such as ibuprofen or naproxen. Ice: May apply ice to the area over the next 24 hours for 15 minutes at a time to reduce swelling. Elevation: Keep the extremity elevated as often as possible to reduce pain and inflammation. Follow up: If symptoms are improving, you may follow up with your primary care provider for any continued management. If symptoms are not starting to improve within a week, you should follow up with the foot specialist within two weeks. Return: Return to the ED for numbness, weakness, increasing pain, overall worsening symptoms, loss of function, or if symptoms are not improving, you have tried to follow up with the orthopedic specialist, and have been unable to do so.  For prescription assistance, may try using prescription discount sites or apps, such as goodrx.com or Good Rx smart phone app.  Your blood pressure was elevated today.  Be sure to take your home blood pressure medications, as prescribed.

## 2019-11-03 NOTE — ED Triage Notes (Signed)
Patient arrived with PTAR from home injured her right 3rd toe 2 days ago ( hit against metal safe) reports pain radiating to right foot/heel, hypertensive at triage she has not taken her antihypertensive medication.

## 2019-11-03 NOTE — ED Provider Notes (Signed)
[facility] Provider Note   CSN: [id number] Arrival date & time: 11/03/19  0024     History Chief Complaint  Patient presents with  . Toe Injury    Hypertensive    Margaret Aguilar is a 59 y.o. female.  HPI      Margaret Aguilar is a 59 y.o. female, with a history of anemia, anxiety, asthma, DM, GERD, gout, hyperlipidemia, HTN, chronic kidney disease, presenting to the ED with right third toe injury that occurred 2 days ago. Patient states she hit her toe against a hard object getting out of bed.  She has had a throbbing, moderate, nonradiating pain since the incident. She has not tried any medications for her injury. Denies numbness, weakness, other injuries.  Patient was brought in by EMS.  She was noted to be hypertensive.  Patient states she has has been forgetting to take her hypertension medications due to being very busy.  She states she does have adequate supply of these medications. She denies headache, dizziness, syncope, epistaxis, chest pain, shortness of breath, or any other complaints.    Past Medical History:  Diagnosis Date  . Anemia   . Anxiety   . Arthritis   . Asthma   . Bradycardia   . Depression   . Diabetes mellitus without complication ([place])    x 5 yrs  . GERD (gastroesophageal reflux disease)   . Gout   . Herpes   . HLD (hyperlipidemia)   . Hypertension   . Kidney disease    Stage 4  . 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 Pneumonia    several  . Sleep apnea    hasn't gotten cpap yet    Patient Active Problem List   Diagnosis Date Noted  . Bradycardia 08/16/2018  . Gout attack 08/09/2018  . Acute on chronic renal failure ([place]) 08/08/2018  . CKD (chronic kidney disease) stage 4, GFR 15-29 ml/min (HCC) 01/09/2017  . Spondylosis of lumbosacral region 10/09/2016  . Type 2 diabetes mellitus, without long-term current use of insulin ([place]) 09/18/2016  . IDA  (iron deficiency anemia) 12/14/2013  . Guaiac + stool 12/14/2013  . Hypertension 09/28/2013  . Diabetes ([place]) 09/28/2013  . Renal insufficiency 09/28/2013  . Morbid obesity ([place]) 09/28/2013  . Obstructive sleep apnea on CPAP 06/05/2011  . Hyperlipidemia 06/05/2011  . Gastroesophageal reflux disease 06/05/2011  . Arthritis 06/05/2011    Past Surgical History:  Procedure Laterality Date  . AV FISTULA PLACEMENT Left 10/19/2018   Procedure: LEFT ARTERIOVENOUS (AV) FISTULA CREATION;  Surgeon: Waynetta Sandy, MD;  Location: [facility];  Service: Vascular;  Laterality: Left;  . BASCILIC VEIN TRANSPOSITION Left 06/30/2019   Procedure: Bascilic Vein Transposition;  Surgeon: Waynetta Sandy, MD;  Location: [facility];  Service: Vascular;  Laterality: Left;  . CESAREAN SECTION     x 1  . COLONOSCOPY N/A 01/12/2014   Procedure: COLONOSCOPY;  Surgeon: Rogene Houston, MD;  [facility];  Service: Endoscopy;  Laterality: N/A;  225  . TUBAL LIGATION       OB History   No obstetric history on file.     Family History  Problem Relation Age of Onset  . Diabetes Mother   . Hyperlipidemia Mother   . Hyperlipidemia Father   . Hypertension Father   . Stroke Brother   . Diabetes Brother     Social History   Tobacco Use  .  Smoking status: Never Smoker  . Smokeless tobacco: Never Used  Vaping Use  . Vaping Use: Never used  Substance Use Topics  . Alcohol use: No  . Drug use: Yes    Types: Marijuana    Home Medications Prior to Admission medications   Medication Sig Start Date End Date Taking? Authorizing Provider  acetaminophen (TYLENOL) 500 MG tablet Take 2 tablets (1,000 mg total) by mouth every 6 (six) hours as needed. Patient taking differently: Take 1,000 mg by mouth every 6 (six) hours as needed for moderate pain.  08/11/18   Johnson, Clanford L, MD  albuterol (VENTOLIN HFA) 108 (90 Base) MCG/ACT inhaler Inhale 1-2 puffs into the lungs every 6 (six) hours as  needed for wheezing or shortness of breath. 03/28/19   Avegno, Darrelyn Hillock, FNP  allopurinol (ZYLOPRIM) 100 MG tablet Take 100 mg by mouth daily.    [provider]  amLODipine (NORVASC) 5 MG tablet Take 1 tablet (5 mg total) by mouth daily for 30 days. Patient taking differently: Take 10 mg by mouth daily.  08/17/18 06/28/19  Johnson, Clanford L, MD  amoxicillin (AMOXIL) 500 MG tablet Take 500 mg by mouth every 8 (eight) hours as needed.    [provider]  BIDIL 20-37.5 MG tablet Take 1 tablet by mouth in the morning and at bedtime.  09/28/18   [provider]  Blood Glucose Monitoring Suppl (TRUE METRIX AIR GLUCOSE METER) w/Device KIT 1 Device by Does not apply route 2 (two) times daily at 8 am and 10 pm. 01/12/18   Fulp, Cammie, MD  diclofenac Sodium (VOLTAREN) 1 % GEL Apply 2 g topically 4 (four) times daily. 11/03/19   T[place]ica Provencio C, PA-C  glucose blood (TRUE METRIX BLOOD GLUCOSE TEST) test strip Use as instructed to check blood sugars twice per day 01/12/18   Fulp, Cammie, MD  loratadine (CLARITIN) 10 MG tablet Take 1 tablet (10 mg total) by mouth daily. 07/24/19   Palumbo, April, MD  metoprolol succinate (TOPROL-XL) 100 MG 24 hr tablet Take 50 mg by mouth daily. Take with or immediately following a meal.    [provider]  TRUEPLUS LANCETS 28G MISC Use to check blood sugar twice per day 01/12/18   Antony Blackbird, MD    Allergies    Nutritional supplements and Latex  Review of Systems   Review of Systems  Respiratory: Negative for shortness of breath.   Cardiovascular: Negative for chest pain.  Gastrointestinal: Negative for abdominal pain, nausea and vomiting.  Musculoskeletal: Positive for arthralgias.  Neurological: Negative for dizziness, weakness, numbness and headaches.    Physical Exam Updated Vital Signs BP (!) 190/87 (BP Location: Right Arm)   Pulse 77   Temp 98.9 F (37.2 C) (Oral)   Resp 16   Ht '5\' 2"'  (1.575 m)   Wt 110 kg   LMP  (LMP  Unknown)   SpO2 97%   BMI 44.35 kg/m   Physical Exam Vitals and nursing note reviewed.  Constitutional:      General: She is not in acute distress.    Appearance: She is well-developed. She is not diaphoretic.  HENT:     Head: Normocephalic and atraumatic.  Eyes:     Conjunctiva/sclera: Conjunctivae normal.  Cardiovascular:     Rate and Rhythm: Normal rate and regular rhythm.     Pulses:          Dorsalis pedis pulses are 2+ on the right side.  Posterior tibial pulses are 2+ on the right side.  Pulmonary:     Effort: Pulmonary effort is normal.  Musculoskeletal:     Cervical back: Neck supple.     Comments: Tenderness to the right third toe without swelling, deformity, or color abnormality.  Skin:    General: Skin is warm and dry.     Capillary Refill: Capillary refill takes less than 2 seconds.     Coloration: Skin is not pale.  Neurological:     Mental Status: She is alert.     Comments: Sensation to light touch grossly intact in the right foot and toes. Appropriate motor function intact in the toes of the right foot.  Psychiatric:        Behavior: Behavior normal.     ED Results / Procedures / Treatments   Labs (all labs ordered are listed, but only abnormal results are displayed) Labs Reviewed - No data to display  EKG None  Radiology DG Toe 3rd Right  Result Date: 11/03/2019 CLINICAL DATA:  Pain EXAM: RIGHT THIRD TOE COMPARISON:  None. FINDINGS: There is no evidence of fracture or dislocation. There is no evidence of arthropathy or other focal bone abnormality. Soft tissues are unremarkable. IMPRESSION: Negative. Electronically Signed   By: Constance Holster M.D.   On: 11/03/2019 00:57    Procedures Procedures (including critical care time)  Medications Ordered in ED Medications - No data to display  ED Course  I have reviewed the triage vital signs and the nursing notes.  Pertinent labs & imaging results that were available during my care of the  patient were reviewed by me and considered in my medical decision making (see chart for details).    MDM Rules/Calculators/A&P                          Patient presents for evaluation of a right third toe injury.  No evidence of neurovascular compromise. I personally reviewed and interpreted the patient's imaging study. No acute fracture or other osseous abnormalities on x-ray. PCP versus podiatry follow-up, as needed. The patient was given instructions for home care as well as return precautions. Patient voices understanding of these instructions, accepts the plan, and is comfortable with discharge.   Patient noted to be hypertensive.  She is asymptomatic to it at this time.  Doubt hypertensive emergency.  I discussed this hypertension with the patient and advised her to regularly take her prescribed medications.   Vitals:   11/03/19 0030 11/03/19 0034 11/03/19 0450  BP:  (!) 190/87 (!) 158/102  Pulse:  77 78  Resp:  16 16  Temp:  98.9 F (37.2 C) 98.6 F (37 C)  TempSrc:  Oral Oral  SpO2:  97% 100%  Weight: 110 kg    Height: '5\' 2"'  (1.575 m)       Final Clinical Impression(s) / ED Diagnoses Final diagnoses:  Injury of toe on right foot, initial encounter    Rx / DC Orders ED Discharge Orders         Ordered    diclofenac Sodium (VOLTAREN) 1 % GEL  4 times daily     Discontinue  Reprint     11/03/19 [place], Ilijah Doucet C, PA-C 11/03/19 0505    Fatima Blank, MD 11/04/19 [phone]

## 2019-11-04 ENCOUNTER — Telehealth: Payer: Self-pay | Admitting: *Deleted

## 2019-11-04 NOTE — Telephone Encounter (Signed)
2nd attempt pt contact pt to complete transitiono care assessment; left message on voicemail.  Lenor Coffin, RN, BSN, [place] [place] [phone]

## 2019-11-04 NOTE — Telephone Encounter (Signed)
Attempted to contact pt to complete transition of care assessment; message states voicemail is full; unable to leave message.  Katrice Toleen Lachapelle, RN, BSN, CCRN [facility] [phone]  

## 2019-11-23 ENCOUNTER — Other Ambulatory Visit: Payer: Self-pay

## 2019-11-23 ENCOUNTER — Encounter (INDEPENDENT_AMBULATORY_CARE_PROVIDER_SITE_OTHER): Payer: Self-pay | Admitting: Bariatrics

## 2019-11-23 ENCOUNTER — Ambulatory Visit (INDEPENDENT_AMBULATORY_CARE_PROVIDER_SITE_OTHER): Payer: Medicaid Other | Admitting: Bariatrics

## 2019-11-23 VITALS — BP 110/67 | HR 60 | Temp 98.1°F | Ht 61.0 in | Wt 221.0 lb

## 2019-11-23 DIAGNOSIS — Z9989 Dependence on other enabling machines and devices: Secondary | ICD-10-CM

## 2019-11-23 DIAGNOSIS — N184 Chronic kidney disease, stage 4 (severe): Secondary | ICD-10-CM | POA: Diagnosis not present

## 2019-11-23 DIAGNOSIS — G4733 Obstructive sleep apnea (adult) (pediatric): Secondary | ICD-10-CM

## 2019-11-23 DIAGNOSIS — R0602 Shortness of breath: Secondary | ICD-10-CM

## 2019-11-23 DIAGNOSIS — R5383 Other fatigue: Secondary | ICD-10-CM | POA: Diagnosis not present

## 2019-11-23 DIAGNOSIS — F3289 Other specified depressive episodes: Secondary | ICD-10-CM | POA: Diagnosis not present

## 2019-11-23 DIAGNOSIS — E119 Type 2 diabetes mellitus without complications: Secondary | ICD-10-CM

## 2019-11-23 DIAGNOSIS — Z6841 Body Mass Index (BMI) 40.0 and over, adult: Secondary | ICD-10-CM

## 2019-11-23 DIAGNOSIS — D508 Other iron deficiency anemias: Secondary | ICD-10-CM

## 2019-11-23 DIAGNOSIS — I129 Hypertensive chronic kidney disease with stage 1 through stage 4 chronic kidney disease, or unspecified chronic kidney disease: Secondary | ICD-10-CM

## 2019-11-23 DIAGNOSIS — Z0289 Encounter for other administrative examinations: Secondary | ICD-10-CM

## 2019-11-23 DIAGNOSIS — E7849 Other hyperlipidemia: Secondary | ICD-10-CM | POA: Diagnosis not present

## 2019-11-24 ENCOUNTER — Encounter (INDEPENDENT_AMBULATORY_CARE_PROVIDER_SITE_OTHER): Payer: Self-pay | Admitting: Bariatrics

## 2019-11-24 DIAGNOSIS — E559 Vitamin D deficiency, unspecified: Secondary | ICD-10-CM | POA: Insufficient documentation

## 2019-11-24 LAB — LIPID PANEL WITH LDL/HDL RATIO
Cholesterol, Total: 215 mg/dL — ABNORMAL HIGH (ref 100–199)
HDL: 46 mg/dL (ref >39)
LDL Chol Calc (NIH): 143 mg/dL — ABNORMAL HIGH (ref 0–99)
LDL/HDL Ratio: 3.1 ratio (ref 0.0–3.2)
Triglycerides: 146 mg/dL (ref 0–149)
VLDL Cholesterol Cal: 26 mg/dL (ref 5–40)

## 2019-11-24 LAB — VITAMIN D 25 HYDROXY (VIT D DEFICIENCY, FRACTURES): Vit D, 25-Hydroxy: 9 ng/mL — ABNORMAL LOW (ref 30.0–100.0)

## 2019-11-24 LAB — T4, FREE: Free T4: 1.28 ng/dL (ref 0.82–1.77)

## 2019-11-24 LAB — INSULIN, RANDOM: INSULIN: 10 u[IU]/mL (ref 2.6–24.9)

## 2019-11-24 LAB — HEMOGLOBIN A1C
Est. average glucose Bld gHb Est-mCnc: 131 mg/dL
Hgb A1c MFr Bld: 6.2 % — ABNORMAL HIGH (ref 4.8–5.6)

## 2019-11-24 LAB — T3: T3, Total: 108 ng/dL (ref 71–180)

## 2019-11-24 LAB — TSH: TSH: 2.97 u[IU]/mL (ref 0.450–4.500)

## 2019-11-28 ENCOUNTER — Encounter (INDEPENDENT_AMBULATORY_CARE_PROVIDER_SITE_OTHER): Payer: Self-pay | Admitting: Bariatrics

## 2019-11-28 NOTE — Progress Notes (Signed)
Dear Margaret Fairly, NP,   Thank you for referring Margaret Aguilar to our clinic. The following note includes my evaluation and treatment recommendations.  Chief Complaint:   OBESITY Margaret Aguilar (MR# [id number]) is a 59 y.o. female who presents for evaluation and treatment of obesity and related comorbidities. Current BMI is Body mass index is 41.76 kg/m.[name] Margaret Aguilar has been struggling with her weight for many years and has been unsuccessful in either losing weight, maintaining weight loss, or reaching her healthy weight goal.  Margaret Aguilar is currently in the action stage of change and ready to dedicate time achieving and maintaining a healthier weight. Margaret Aguilar is interested in becoming our patient and working on intensive lifestyle modifications including (but not limited to) diet and exercise for weight loss.  Margaret Aguilar does like to cook, but notes energy and cleanup as obstacles. She craves carbohydrates. She skips breakfast.  Margaret Aguilar's habits were reviewed today and are as follows: Her family eats meals together, her desired weight loss is 41 lbs, she has been heavy most of her life, she started gaining weight around the age of 53 or 67, her heaviest weight ever was 270 pounds, she is a picky eater and doesn't like to eat healthier foods, she craves ice cream and breads, she snacks frequently in the evenings, she skips breakfast 3 or 4 days a week, she frequently makes poor food choices, she has problems with excessive hunger, she frequently eats larger portions than normal, she has binge eating behaviors and she struggles with emotional eating.  Depression Screen Margaret Aguilar's Food and Mood (modified PHQ-9) score was 21.  Depression screen PHQ 2/9 11/23/2019  Decreased Interest 2  Down, Depressed, Hopeless 3  PHQ - 2 Score 5  Altered sleeping 2  Tired, decreased energy 3  Change in appetite 3  Feeling bad or failure about yourself  3  Trouble concentrating 2  Moving slowly or fidgety/restless  2  Suicidal thoughts 1  PHQ-9 Score 21  Difficult doing work/chores Very difficult   Subjective:   Other fatigue. Margaret Aguilar admits to daytime somnolence and admits to waking up still tired. Patent has a history of symptoms of daytime fatigue, Epworth sleepiness scale and morning headache. Akiyah generally gets 5 hours of sleep per night, and states that she does not sleep well most nights. Snoring is present. Apneic episodes are not present. Epworth Sleepiness Score is 15.  SOB (shortness of breath) on exertion. Margaret Aguilar notes increasing shortness of breath with certain activities and seems to be worsening over time with weight gain. She notes getting out of breath sooner with activity than she used to. This has gotten worse recently. Gargi denies shortness of breath at rest or orthopnea.  Benign hypertension with CKD (chronic kidney disease) stage IV ([place]). Margaret Aguilar is taking Toprol and Norvasc. Blood pressure is controlled.  BP Readings from Last 3 Encounters:  11/23/19 110/67  11/03/19 (!) 158/102  09/29/19 116/75   Lab Results  Component Value Date   CREATININE 3.70 (H) 06/30/2019   CREATININE 3.29 (H) 08/16/2018   CREATININE 4.00 (H) 08/11/2018   CKD (chronic kidney disease), stage IV ([place]). Margaret Aguilar is followed at the [facility] in [place]. She is having fistula prepping for kidney dialysis.  Other iron deficiency anemia. Hemoglobin and hematocrit were normal on 06/30/2019.  CBC Latest Ref Rng & Units 06/30/2019 10/19/2018 08/17/2018  WBC 4.0 - 10.5 K/uL - - 18.0(H)  Hemoglobin 12.0 - 15.0 g/dL 12.9 14.3 11.4(L)  Hematocrit 36 - 46 %  38.0 42.0 37.6  Platelets 150 - 400 K/uL - - 450(H)   No results found for: IRON, TIBC, FERRITIN No results found for: VITAMINB12  Type 2 diabetes mellitus without complication, without long-term current use of insulin ([place]). Margaret Aguilar has a history of gestational diabetes. She is on no medication (diet controlled).  Lab Results  Component Value Date    HGBA1C 6.6 01/12/2018   Lab Results  Component Value Date   CREATININE 3.70 (H) 06/30/2019   OSA on CPAP. Margaret Aguilar is not currently using CPAP.  Other hyperlipidemia. Margaret Aguilar is on no medication for cholesterol at this time.   Lab Results  Component Value Date   CHOL 215 (H) 11/23/2019   HDL 46 11/23/2019   LDLCALC 143 (H) 11/23/2019   TRIG 146 11/23/2019   CHOLHDL 5.2 (H) 01/12/2018   Lab Results  Component Value Date   ALT 49 (H) 08/16/2018   AST 22 08/16/2018   ALKPHOS 88 08/16/2018   BILITOT 0.3 08/16/2018   The 10-year ASCVD risk score Mikey Bussing DC Jr., et al., 2013) is: 9.9%   Values used to calculate the score:     Age: 33 years     Sex: Female     Is Non-Hispanic African American: Yes     Diabetic: Yes     Tobacco smoker: No     Systolic Blood Pressure: 650 mmHg     Is BP treated: Yes     HDL Cholesterol: 46 mg/dL     Total Cholesterol: 215 mg/dL  Other depression, with emotional eating. Margaret Aguilar is struggling with emotional eating and using food for comfort to the extent that it is negatively impacting her health. She has been working on behavior modification techniques to help reduce her emotional eating and has been somewhat successful. She shows no sign of suicidal or homicidal ideations. Margaret Aguilar reports emotional/stress eating.  Assessment/Plan:   Other fatigue. Margaret Aguilar does feel that her weight is causing her energy to be lower than it should be. Fatigue may be related to obesity, depression or many other causes. Labs will be ordered, and in the meanwhile, Margaret Aguilar will focus on self care including making healthy food choices, increasing physical activity and focusing on stress reduction. EKG 12-Lead, VITAMIN D 25 Hydroxy (Vit-D Deficiency, Fractures), T3, T4, free, TSH testing ordered today.  SOB (shortness of breath) on exertion. Margaret Aguilar does feel that she gets out of breath more easily that she used to when she exercises. Margaret Aguilar's shortness of breath appears to be obesity  related and exercise induced. She has agreed to work on weight loss and gradually increase exercise to treat her exercise induced shortness of breath. Will continue to monitor closely. Lipid Panel With LDL/HDL Ratio labs will be checked today.   Benign hypertension with CKD (chronic kidney disease) stage IV ([place]). Margaret Aguilar is working on healthy weight loss and exercise to improve blood pressure control. We will watch for signs of hypotension as she continues her lifestyle modifications. She will continue her medications as directed.   CKD (chronic kidney disease), stage IV ([place]). Margaret Aguilar will follow-up with the [facility] in [place] as scheduled and as directed.   Other iron deficiency anemia. Orders and follow up as documented in patient record. PCP will follow.  Counseling . Iron is essential for our bodies to make red blood cells.  Reasons that someone may be deficient include: an iron-deficient diet (more likely in those following vegan or vegetarian diets), women with heavy menses, patients with GI disorders or  poor absorption, patients that have had bariatric surgery, frequent blood donors, patients with cancer, and patients with heart disease.   [name] An iron supplement has been recommended. This is found over-the-counter.   Margaret Aguilar foods include dark leafy greens, red and white meats, eggs, seafood, and beans.   . Certain foods and drinks prevent your body from absorbing iron properly. Avoid eating these foods in the same meal as iron-rich foods or with iron supplements. These foods include: coffee, black tea, and red wine; milk, dairy products, and foods that are high in calcium; beans and soybeans; whole grains.  . Constipation can be a side effect of iron supplementation. Increased water and fiber intake are helpful. Water goal: > 2 liters/day. Fiber goal: > 25 grams/day.  Type 2 diabetes mellitus without complication, without long-term current use of insulin ([place]). Good blood sugar  control is important to decrease the likelihood of diabetic complications such as nephropathy, neuropathy, limb loss, blindness, coronary artery disease, and death. Intensive lifestyle modification including diet, exercise and weight loss are the first line of treatment for diabetes. Hemoglobin A1c, Insulin, random levels will be checked today.  OSA on CPAP. Intensive lifestyle modifications are the first line treatment for this issue. We discussed several lifestyle modifications today and she will continue to work on diet, exercise and weight loss efforts. We will continue to monitor. Orders and follow up as documented in patient record. We discussed the importance of wearing CPAP.  Counseling  Sleep apnea is a condition in which breathing pauses or becomes shallow during sleep. This happens over and over during the night. This disrupts your sleep and keeps your body from getting the rest that it needs, which can cause tiredness and lack of energy (fatigue) during the day.  Sleep apnea treatment: If you were given a device to open your airway while you sleep, USE IT!  Sleep hygiene:   Limit or avoid alcohol, caffeinated beverages, and cigarettes, especially close to bedtime.   Do not eat a large meal or eat spicy foods right before bedtime. This can lead to digestive discomfort that can make it hard for you to sleep.  Keep a sleep diary to help you and your health care provider figure out what could be causing your insomnia.  . Make your bedroom a dark, comfortable place where it is easy to fall asleep. ? Put up shades or blackout curtains to block light from outside. ? Use a white noise machine to block noise. ? Keep the temperature cool. . Limit screen use before bedtime. This includes: ? Watching TV. ? Using your smartphone, tablet, or computer. . Stick to a routine that includes going to bed and waking up at the same times every day and night. This can help you fall asleep faster.  Consider making a quiet activity, such as reading, part of your nighttime routine. . Try to avoid taking naps during the day so that you sleep better at night. . Get out of bed if you are still awake after 15 minutes of trying to sleep. Keep the lights down, but try reading or doing a quiet activity. When you feel sleepy, go back to bed.  Other hyperlipidemia. Cardiovascular risk and specific lipid/LDL goals reviewed.  We discussed several lifestyle modifications today and Zhaniya will continue to work on diet, exercise and weight loss efforts. Orders and follow up as documented in patient record. Lipid Panel With LDL/HDL Ratio will be checked today.  Counseling Intensive lifestyle modifications are the  first line treatment for this issue. . Dietary changes: Increase soluble fiber. Decrease simple carbohydrates. . Exercise changes: Moderate to vigorous-intensity aerobic activity 150 minutes per week if tolerated. . Lipid-lowering medications: see documented in medical record.   Other depression, with emotional eating. Behavior modification techniques were discussed today to help Kiaraliz deal with her emotional/non-hunger eating behaviors.  Orders and follow up as documented in patient record. Margaret Aguilar will be referred to a therapist in [place]. We discussed strategies for stress eating.  Class 3 severe obesity with serious comorbidity and body mass index (BMI) of 40.0 to 44.9 in adult, unspecified obesity type ([place]).  Margaret Aguilar is currently in the action stage of change and her goal is to continue with weight loss efforts. I recommend Margaret Aguilar begin the structured treatment plan as follows:  She has agreed to the Category 2 Plan.  She will work on meal planning, intentional eating, and will stop all sugary drinks.  We reviewed with the patient labs from 06/30/2019 including chemistry profile, CBC, and glucose.  Exercise goals: All adults should avoid inactivity. Some physical activity is better than  none, and adults who participate in any amount of physical activity gain some health benefits.   Behavioral modification strategies: increasing lean protein intake, decreasing simple carbohydrates, increasing vegetables, increasing water intake, decreasing eating out, no skipping meals, meal planning and cooking strategies, keeping healthy foods in the home and planning for success.  She was informed of the importance of frequent follow-up visits to maximize her success with intensive lifestyle modifications for her multiple health conditions. She was informed we would discuss her lab results at her next visit unless there is a critical issue that needs to be addressed sooner. Margaret Aguilar agreed to keep her next visit at the agreed upon time to discuss these results.  Objective:   Blood pressure 110/67, pulse 60, temperature 98.1 F (36.7 C), height 5\' 1"  (1.549 m), weight 221 lb (100.2 kg), SpO2 97 %. Body mass index is 41.76 kg/m.  EKG: Sinus  Rhythm with a rate of 68 BPM. Left atrial enlargement. Nonspecific T-abnormality.  Otherwise normal  Indirect Calorimeter completed today shows a VO2 of 242 and a REE of 1684.  Her calculated basal metabolic rate is 4696 thus her basal metabolic rate is better than expected.  General: Cooperative, alert, well developed, in no acute distress. HEENT: Conjunctivae and lids unremarkable. Cardiovascular: Regular rhythm.  Lungs: Normal work of breathing. Neurologic: No focal deficits.   Lab Results  Component Value Date   CREATININE 3.70 (H) 06/30/2019   BUN 36 (H) 06/30/2019   NA 143 06/30/2019   K 3.2 (L) 06/30/2019   CL 108 06/30/2019   CO2 22 08/16/2018   Lab Results  Component Value Date   ALT 49 (H) 08/16/2018   AST 22 08/16/2018   ALKPHOS 88 08/16/2018   BILITOT 0.3 08/16/2018   Lab Results  Component Value Date   HGBA1C 6.2 (H) 11/23/2019   HGBA1C 6.6 01/12/2018   Lab Results  Component Value Date   INSULIN 10.0 11/23/2019   Lab  Results  Component Value Date   TSH 2.970 11/23/2019   Lab Results  Component Value Date   CHOL 215 (H) 11/23/2019   HDL 46 11/23/2019   LDLCALC 143 (H) 11/23/2019   TRIG 146 11/23/2019   CHOLHDL 5.2 (H) 01/12/2018   Lab Results  Component Value Date   WBC 18.0 (H) 08/17/2018   HGB 12.9 06/30/2019   HCT 38.0 06/30/2019   MCV  77.0 (L) 08/17/2018   PLT 450 (H) 08/17/2018   No results found for: IRON, TIBC, FERRITIN  Attestation Statements:   Reviewed by clinician on day of visit: allergies, medications, problem list, medical history, surgical history, family history, social history, and previous encounter notes.  Time spent on visit including pre-visit chart review and post-visit charting and care was 60 minutes.   Migdalia Dk, am acting as Location manager for [employer], DO   I have reviewed the above documentation for accuracy and completeness, and I agree with the above. Jearld Lesch, DO

## 2019-12-01 ENCOUNTER — Telehealth: Payer: Self-pay | Admitting: General Practice

## 2019-12-01 ENCOUNTER — Other Ambulatory Visit: Payer: Self-pay | Admitting: *Deleted

## 2019-12-01 ENCOUNTER — Other Ambulatory Visit: Payer: Self-pay

## 2019-12-01 NOTE — Telephone Encounter (Signed)
Pt was contacted to resched 8/20 appt. Unable to contact pt and unable to lvm

## 2019-12-01 NOTE — Patient Instructions (Signed)
Visit Information  Ms. Ohanesian was given information about Medicaid Managed Care team care coordination services and consented to engagement with the [facility] Managed Care team.   Goals Addressed              This Visit's Progress   .  "I need help getting a primary care doctor" (pt-stated)        [place] Medicaid Managed Care (see longitudinal plan of care for additional care plan information)  Current Barriers:  . Patient does not have PCP  Nurse Case Manager Clinical Goal(s):  [name] Over the next 7 days, patient will verbalize understanding of plan for new PCP appointment  Interventions:  . Inter-disciplinary care team collaboration (see longitudinal plan of care) . Collaborated with  [facility]) care guide regarding need for PCP referral. Patient willing to take first available appointment.   Patient Self Care Activities:  . Patient verbalizes understanding of plan to anticipate call regarding new patient appointment with PCP.  [name] Unable to independently procure PCP appointment.   Initial goal documentation     .  "I want to talk to someone about my breathing and sleeping" (pt-stated)        [place] Medicaid Managed Care (see longitudinal plan of care for additional care plan information)  Current Barriers:  [name] Knowledge Deficits related to appropriate evaluations for sleep study, respiratory support; patient reports her husband observes periods of "not breathing" during the night; patient had sleep study in 2015 but was unable to afford CPAP at the time; wishes to be re-evaluated; also asks about getting a Ventolin inhaler as she is concerned about breathing problems since the advent of COVID  Nurse Case Manager Clinical Goal(s):  [name] Over the next 14 days, patient will verbalize understanding of plan for referral to new PCP and appropriate evaluations thereafter  Interventions:  . Inter-disciplinary care team collaboration (see longitudinal plan of  care) . Collaborated with [facility]  regarding connecting patient to PCP; when new PCP established, MM team will forward notes and requests to PCP  Patient Self Care Activities:  . Patient verbalizes understanding of plan to be connected to new PCP  Initial goal documentation     .  "I would like to talk to someone about counseling" (pt-stated)        [place] (see longitudinal plan of care for additional care plan information)  Current Barriers:  . Care Coordination needs related to counseling resources available in a patient with stated emotional health needs  Nurse Case Manager Clinical Goal(s):  [name] Over the next 14 days, patient will verbalize understanding of plan for counseling support . Over the next 7 days, patient will work with CM clinical social worker to address counseling support needs  Interventions:  . Inter-disciplinary care team collaboration (see longitudinal plan of care) . Evaluation of current treatment plan related to emotional health and patient's adherence to plan as established by provider. Patient states that she desires assistance with mental/emotional health and is not currently connected to a therapist.  . Collaborated with LCSW regarding emotional/mental health needs.  . Discussed plans with patient for ongoing care management follow up and provided patient with direct contact information for care management team  Patient Self Care Activities:  . Patient verbalizes understanding of plan to work with LCSW for mental/emotional health support . Unable to independently procure long term counseling/therapy support  Initial goal documentation        Patient verbalizes  understanding of instructions provided today.   The Managed Medicaid care management team will reach out to the patient again over the next 7 days.   [place] Management Coordinator Direct Dial:   [phone]  Fax: [phone]

## 2019-12-01 NOTE — Patient Outreach (Signed)
Care Coordination - Case Manager  12/01/2019  Tinya Cadogan [facility] Mar 12, 1961 [id number]  Subjective:  Margaret Aguilar is an 59 y.o. year old female who is a primary patient of Patient, No Pcp Per.  Ms. Bohnsack was given information about Medicaid Managed Care team care coordination services today. Coralee North Tuohy agreed to services and verbal consent obtained  Review of patient status, laboratory and other test data was performed as part of evaluation for provision of services.  SDOH: SDOH Screenings   Alcohol Screen:   . Last Alcohol Screening Score (AUDIT): Not on file  Depression (PHQ2-9): Medium Risk  . PHQ-2 Score: 21  Financial Resource Strain:   . Difficulty of Paying Living Expenses: Not on file  Food Insecurity:   . Worried About Charity fundraiser in the Last Year: Not on file  . Ran Out of Food in the Last Year: Not on file  Housing:   . Last Housing Risk Score: Not on file  Physical Activity:   . Days of Exercise per Week: Not on file  . Minutes of Exercise per Session: Not on file  Social Connections:   . Frequency of Communication with Friends and Family: Not on file  . Frequency of Social Gatherings with Friends and Family: Not on file  . Attends Religious Services: Not on file  . Active Member of Clubs or Organizations: Not on file  . Attends Archivist Meetings: Not on file  . Marital Status: Not on file  Stress:   . Feeling of Stress : Not on file  Tobacco Use: Low Risk   . Smoking Tobacco Use: Never Smoker  . Smokeless Tobacco Use: Never Used  Transportation Needs:   . Film/video editor (Medical): Not on file  . Lack of Transportation (Non-Medical): Not on file   SDOH Interventions     Most Recent Value  SDOH Interventions  SDOH Interventions for the Following Domains Depression  Depression Interventions/Treatment  Counseling  [referred to MM LCSW for assessment/counseling]      Objective:    Allergies  Allergen  Reactions  . Nutritional Supplements     Walnut >> UNSPECIFIED REACTION  Severity from [place]  . Latex Swelling and Rash    SWELLING REACTION UNSPECIFIED     Medications:    Medications Reviewed Today    Reviewed by Clerance Lav, RN (Registered Nurse) on 12/01/19 at [place] List Status: <None>  Medication Order Taking? Sig Documenting Provider Last Dose Status Informant  allopurinol (ZYLOPRIM) 100 MG tablet [id number] Yes Take 100 mg by mouth daily. [provider] Taking Active Self  amLODipine (NORVASC) 5 MG tablet [id number]  Take 1 tablet (5 mg total) by mouth daily for 30 days.  Patient taking differently: Take 10 mg by mouth daily.    [name], MD  Expired 06/28/19 2359 Self           Med Note [name] Dec 01, 2019  2:27 PM) Has on hand; 10mg   isosorbide-hydrALAZINE (BIDIL) 20-37.5 MG tablet [id number] Yes Take by mouth 2 (two) times daily. [provider] Taking Active   metoprolol succinate (TOPROL-XL) 100 MG 24 hr tablet [id number] Yes Take 50 mg by mouth daily. Take with or immediately following a meal. [provider] Taking Active Self          Assessment:   Goals Addressed              This  Visit's Progress   .  "I need help getting a primary care doctor" (pt-stated)        [place] Medicaid Managed Care (see longitudinal plan of care for additional care plan information)  Current Barriers:  . Patient does not have PCP  Nurse Case Manager Clinical Goal(s):  [name] Over the next 7 days, patient will verbalize understanding of plan for new PCP appointment  Interventions:  . Inter-disciplinary care team collaboration (see longitudinal plan of care) . Collaborated with  [facility]) care guide regarding need for PCP referral. Patient willing to take first available appointment.   Patient Self Care Activities:  . Patient verbalizes understanding of plan to anticipate call regarding new patient appointment with  PCP.  [name] Unable to independently procure PCP appointment.   Initial goal documentation     .  "I want to talk to someone about my breathing and sleeping" (pt-stated)        [place] Medicaid Managed Care (see longitudinal plan of care for additional care plan information)  Current Barriers:  [name] Knowledge Deficits related to appropriate evaluations for sleep study, respiratory support; patient reports her husband observes periods of "not breathing" during the night; patient had sleep study in 2015 but was unable to afford CPAP at the time; wishes to be re-evaluated; also asks about getting a Ventolin inhaler as she is concerned about breathing problems since the advent of COVID  Nurse Case Manager Clinical Goal(s):  [name] Over the next 14 days, patient will verbalize understanding of plan for referral to new PCP and appropriate evaluations thereafter  Interventions:  . Inter-disciplinary care team collaboration (see longitudinal plan of care) . Collaborated with PEC  regarding connecting patient to PCP; when new PCP established, MM team will forward notes and requests to PCP  Patient Self Care Activities:  . Patient verbalizes understanding of plan to be connected to new PCP  Initial goal documentation     .  "I would like to talk to someone about counseling" (pt-stated)        [place] (see longitudinal plan of care for additional care plan information)  Current Barriers:  . Care Coordination needs related to counseling resources available in a patient with stated emotional health needs  Nurse Case Manager Clinical Goal(s):  [name] Over the next 14 days, patient will verbalize understanding of plan for counseling support . Over the next 7 days, patient will work with CM clinical social worker to address counseling support needs  Interventions:  . Inter-disciplinary care team collaboration (see longitudinal plan of care) . Evaluation of current  treatment plan related to emotional health and patient's adherence to plan as established by provider. Patient states that she desires assistance with mental/emotional health and is not currently connected to a therapist.  . Collaborated with LCSW regarding emotional/mental health needs.  . Discussed plans with patient for ongoing care management follow up and provided patient with direct contact information for care management team  Patient Self Care Activities:  . Patient verbalizes understanding of plan to work with LCSW for mental/emotional health support . Unable to independently procure long term counseling/therapy support  Initial goal documentation        Plan: PEC will outreach on behalf of patient to schedule appointment with new PCP. LCSW on MM team has scheduled appointment with patient next week. RNCM will follow up with patient within 14 days. Patient provided with contact information for MM CM team.  [place] Management Coordinator Direct Dial:  [phone]  Fax: [phone]

## 2019-12-02 ENCOUNTER — Ambulatory Visit: Payer: Medicaid Other

## 2019-12-05 ENCOUNTER — Other Ambulatory Visit: Payer: Self-pay

## 2019-12-05 ENCOUNTER — Ambulatory Visit: Payer: Self-pay

## 2019-12-05 DIAGNOSIS — I10 Essential (primary) hypertension: Secondary | ICD-10-CM

## 2019-12-05 DIAGNOSIS — F329 Major depressive disorder, single episode, unspecified: Secondary | ICD-10-CM

## 2019-12-05 NOTE — Patient Outreach (Addendum)
Care Coordination- Social Work  12/05/2019  Margaret Aguilar [facility] 04/21/1960 [id number]  Subjective:    Margaret Aguilar is an 59 y.o. year old female who is a primary patient of Patient, No Pcp Per.    [name] was given information about Medicaid Managed Care team care coordination services today. Margaret Aguilar agreed to services and verbal consent obtained  Review of patient status, laboratory and other test data was performed as part of evaluation for provision of services.  SDOH:   SDOH Screenings   Alcohol Screen:   . Last Alcohol Screening Score (AUDIT): Not on file  Depression (PHQ2-9): Medium Risk  . PHQ-2 Score: 21  Financial Resource Strain:   . Difficulty of Paying Living Expenses: Not on file  Food Insecurity:   . Worried About Charity fundraiser in the Last Year: Not on file  . Ran Out of Food in the Last Year: Not on file  Housing:   . Last Housing Risk Score: Not on file  Physical Activity:   . Days of Exercise per Week: Not on file  . Minutes of Exercise per Session: Not on file  Social Connections:   . Frequency of Communication with Friends and Family: Not on file  . Frequency of Social Gatherings with Friends and Family: Not on file  . Attends Religious Services: Not on file  . Active Member of Clubs or Organizations: Not on file  . Attends Archivist Meetings: Not on file  . Marital Status: Not on file  Stress:   . Feeling of Stress : Not on file  Tobacco Use: Low Risk   . Smoking Tobacco Use: Never Smoker  . Smokeless Tobacco Use: Never Used  Transportation Needs:   . Film/video editor (Medical): Not on file  . Lack of Transportation (Non-Medical): Not on file     Objective:    Medications:  Medications Reviewed Today    Reviewed by Margaret Lav, RN (Registered Nurse) on 12/01/19 at [place] List Status: <None>  Medication Order Taking? Sig Documenting Provider Last Dose Status Informant  allopurinol  (ZYLOPRIM) 100 MG tablet [id number] Yes Take 100 mg by mouth daily. [provider] Taking Active Self  amLODipine (NORVASC) 5 MG tablet [id number]  Take 1 tablet (5 mg total) by mouth daily for 30 days.  Patient taking differently: Take 10 mg by mouth daily.    Margaret Iba, MD  Expired 06/28/19 2359 Self           Med Note [name] Dec 01, 2019  2:27 PM) Has on hand; 10mg   isosorbide-hydrALAZINE (BIDIL) 20-37.5 MG tablet [id number] Yes Take by mouth 2 (two) times daily. [provider] Taking Active   metoprolol succinate (TOPROL-XL) 100 MG 24 hr tablet [id number] Yes Take 50 mg by mouth daily. Take with or immediately following a meal. [provider] Taking Active Self          Fall/Depression Screening:  Fall Risk  01/12/2018  Falls in the past year? No   PHQ 2/9 Scores 11/23/2019 01/12/2018  PHQ - 2 Score 5 2  PHQ- 9 Score 21 10    Assessment:  Goals Addressed              This Visit's Progress   .  "I need help getting a primary care doctor" (pt-stated)   On track     Margaret Aguilar (see longitudinal plan of  care for additional care plan information)  Current Barriers:  . Patient does not have PCP  Nurse Case Manager Clinical Goal(s):  Margaret Aguilar Kitchen Over the next 7 days, patient will verbalize understanding of plan for new PCP appointment  Interventions:  . Inter-disciplinary care team collaboration (see longitudinal plan of care) . Collaborated with  Margaret Aguilar) care guide regarding need for PCP referral. Patient willing to take first available appointment.   Patient Self Care Activities:  . Patient verbalizes understanding of plan to anticipate call regarding new patient appointment with PCP.  Margaret Aguilar Kitchen Unable to independently procure PCP appointment.   Initial goal documentation  12/05/2019 Update:  Patient is scheduled with Margaret Due, NP on 12/07/2019.    Margaret Aguilar Kitchen  "I would like to talk to someone about counseling"  (pt-stated)   On track     Margaret Aguilar (see longitudinal plan of care for additional care plan information)  Current Barriers:  . Care Coordination needs related to counseling resources available in a patient with stated emotional health needs  Nurse Case Manager Clinical Goal(s):  Margaret Aguilar Kitchen Over the next 14 days, patient will verbalize understanding of plan for counseling support . Over the next 7 days, patient will work with CM clinical social worker to address counseling support needs  Interventions:  . Inter-disciplinary care team collaboration (see longitudinal plan of care) . Evaluation of current treatment plan related to emotional health and patient's adherence to plan as established by provider. Patient states that she desires assistance with mental/emotional health and is not currently connected to a therapist.  . Collaborated with Margaret Aguilar regarding emotional/mental health needs.  . Discussed plans with patient for ongoing care management follow up and provided patient with direct contact information for care management team  Patient Self Care Activities:  . Patient verbalizes understanding of plan to work with Margaret Aguilar for mental/emotional health support . Unable to independently procure long term counseling/therapy support  Initial goal documentation   12/05/2019 Update: Patient verbalized need for outpatient therapy. She discussed her situation at home (she is caring for her nephew's three young children: 57 yo and 28 yo twins, and she has been caring for them for the past 2 years Aguilar to CPS involvement). 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  She discussed what she feels led up to her current health issues (her sister passed away 5 years ago when she ws 59 years old. Patient stated she didn't really deal with her death but she also started gaining weight.) Patient  discussed other life events that she feels also contributed to her current feelings of depression. She acknowledged that she needs help to work through her issues. Margaret Aguilar explained that a referral will be made for her to begin therapy at a local agency. Patient verbalized understanding and was agreeable.        Plan:  The care management team will reach out to the patient again over the next 7 days. A referral will be made for patient to be scheduled for outpatient therapy.  Telephone follow up appointment with [facility] scheduled for 12/12/2019.  Margaret Aguilar will follow-up with patient in 30 days to discuss progress.   Netta Neat, MSW, Margaret Aguilar Managed Medicaid Social Work Case Freight forwarder

## 2019-12-07 ENCOUNTER — Ambulatory Visit (INDEPENDENT_AMBULATORY_CARE_PROVIDER_SITE_OTHER): Payer: Medicaid Other | Admitting: Bariatrics

## 2019-12-12 ENCOUNTER — Other Ambulatory Visit: Payer: Self-pay

## 2019-12-12 ENCOUNTER — Other Ambulatory Visit: Payer: Self-pay | Admitting: *Deleted

## 2019-12-12 NOTE — Patient Outreach (Signed)
Care Coordination - Case Manager  12/12/2019  Stacy Sailer [facility] 1960-11-08 [id number]  Subjective:  Margaret Aguilar is an 59 y.o. year old female who is a primary patient of Patient, No Pcp Per.  Ms. Byus was given information about Medicaid Managed Care team care coordination services today. Margaret Aguilar agreed to services and verbal consent obtained  Review of patient status, laboratory and other test data was performed as part of evaluation for provision of services.  SDOH: SDOH Screenings   Alcohol Screen:   . Last Alcohol Screening Score (AUDIT): Not on file  Depression (PHQ2-9): Medium Risk  . PHQ-2 Score: 21  Financial Resource Strain:   . Difficulty of Paying Living Expenses: Not on file  Food Insecurity: No Food Insecurity  . Worried About Charity fundraiser in the Last Year: Never true  . Ran Out of Food in the Last Year: Never true  Housing:   . Last Housing Risk Score: Not on file  Physical Activity:   . Days of Exercise per Week: Not on file  . Minutes of Exercise per Session: Not on file  Social Connections:   . Frequency of Communication with Friends and Family: Not on file  . Frequency of Social Gatherings with Friends and Family: Not on file  . Attends Religious Services: Not on file  . Active Member of Clubs or Organizations: Not on file  . Attends Archivist Meetings: Not on file  . Marital Status: Not on file  Stress:   . Feeling of Stress : Not on file  Tobacco Use: Low Risk   . Smoking Tobacco Use: Never Smoker  . Smokeless Tobacco Use: Never Used  Transportation Needs:   . Film/video editor (Medical): Not on file  . Lack of Transportation (Non-Medical): Not on file   SDOH Interventions     Most Recent Value  SDOH Interventions  Food Insecurity Interventions Intervention Not Indicated      Objective:    Allergies  Allergen Reactions  . Nutritional Supplements     Walnut >> UNSPECIFIED REACTION    Severity from [place]  . Latex Swelling and Rash    SWELLING REACTION UNSPECIFIED     Medications:    Medications Reviewed Today    Reviewed by Melissa Montane, RN (Registered Nurse) on 12/12/19 at [place] List Status: <None>  Medication Order Taking? Sig Documenting Provider Last Dose Status Informant  allopurinol (ZYLOPRIM) 100 MG tablet [id number] Yes Take 100 mg by mouth daily. [provider] Taking Active Self  amLODipine (NORVASC) 5 MG tablet [id number] Yes Take 1 tablet (5 mg total) by mouth daily for 30 days.  Patient taking differently: Take 10 mg by mouth daily.    [name], MD Taking Active Self           Med Note [name] Dec 01, 2019  2:27 PM) Has on hand; 10mg   isosorbide-hydrALAZINE (BIDIL) 20-37.5 MG tablet [id number] Yes Take by mouth 2 (two) times daily. [provider] Taking Active   metoprolol succinate (TOPROL-XL) 100 MG 24 hr tablet [id number] Yes Take 50 mg by mouth daily. Take with or immediately following a meal. [provider] Taking Active Self          Assessment:   Goals Addressed              This Visit's Progress   .  "I need help getting a primary care doctor" (  pt-stated)        CARE PLAN ENTRY Medicaid Managed Care (see longitudinal plan of care for additional care plan information)  Current Barriers:  . Patient does not have PCP  Nurse Case Manager Clinical Goal(s):  [name] Over the next 7 days, patient will verbalize understanding of plan for new PCP appointment  Interventions:  . Inter-disciplinary care team collaboration (see longitudinal plan of care) . Collaborated with  [facility]) care guide regarding need for PCP referral. Patient willing to take first available appointment.  12/12/19-Pt missed scheduled PCP appt, rescheduled for 12/14/19 @ 10:15  Patient Self Care Activities:  . Patient verbalizes understanding of plan to keep reschedule PCP appt on 12/14/19 @ 10:15. . Unable to  independently procure PCP appointment.   Initial goal documentation  12/05/2019 Update:  Patient is scheduled with Tally Due, NP on 12/07/2019. 12/12/19: Patient plans to keep rescheduled PCP appt on 12/14/19. Patient would like to follow up with RNCM on 12/15/19.    [name]  "I want to talk to someone about my breathing and sleeping" (pt-stated)        [place] Medicaid Managed Care (see longitudinal plan of care for additional care plan information)  Current Barriers:  [name] Knowledge Deficits related to appropriate evaluations for sleep study, respiratory support; patient reports her husband observes periods of "not breathing" during the night; patient had sleep study in 2015 but was unable to afford CPAP at the time; wishes to be re-evaluated; also asks about getting a Ventolin inhaler as she is concerned about breathing problems since the advent of COVID  Nurse Case Manager Clinical Goal(s):  [name] Over the next 14 days, patient will verbalize understanding of plan for referral to new PCP and appropriate evaluations thereafter  Interventions:  . Inter-disciplinary care team collaboration (see longitudinal plan of care) . Collaborated with P[facility]  regarding connecting patient to PCP; when new PCP established, MM team will forward notes and requests to PCP  Patient Self Care Activities:  . Patient verbalizes understanding of plan to be connected to new PCP  Initial goal documentation  Update 12/12/19: RNCM encouraged patient to keep scheduled appt with PCP and discuss any health related issues with the provider. Encouraged patient to use relaxation techniques.      [name]  "I would like to talk to someone about counseling" (pt-stated)        [place] (see longitudinal plan of care for additional care plan information)  Current Barriers:  . Care Coordination needs related to counseling resources available in a patient with stated emotional health needs  Nurse Case  Manager Clinical Goal(s):  [name] Over the next 14 days, patient will verbalize understanding of plan for counseling support . Over the next 7 days, patient will work with CM clinical social worker to address counseling support needs  Interventions:  . Inter-disciplinary care team collaboration (see longitudinal plan of care) . Evaluation of current treatment plan related to emotional health and patient's adherence to plan as established by provider. Patient states that she desires assistance with mental/emotional health and is not currently connected to a therapist.  . Collaborated with LCSW regarding emotional/mental health needs.  . Discussed plans with patient for ongoing care management follow up and provided patient with direct contact information for care management team  Patient Self Care Activities:  . Patient verbalizes understanding of plan to work with LCSW for mental/emotional health support . Unable to independently procure long term counseling/therapy support  Initial goal documentation   12/05/2019 Update: Patient verbalized need for outpatient therapy. She discussed her situation at home (she is caring for her nephew's three young children: 33 yo and 7 yo twins, and she has been caring for them for the past 2 years due to CPS involvement). 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  She discussed what she feels led up to her current health issues (her sister passed away 5 years ago when she ws 59 years old. Patient stated she didn't really deal with her death but she also started gaining weight.) Patient discussed other life events that she feels also contributed to her current feelings of depression. She acknowledged that she needs help to work through her issues. LCSW explained that a referral will be made for her to begin therapy at a local agency. Patient verbalized understanding and was  agreeable.  12/12/19: RN CM collaborated with LCSW regarding Outpatient therapy. Referral made on 12/05/19.       Plan: Follow up telephone visit on 12/15/19 @ 1 pm, per patients request to verify that she kept PCP appt.

## 2019-12-12 NOTE — Patient Instructions (Signed)
Ms. Bowe,  It was so nice talking to you today. I will call you again on Thursday and we can discuss your upcoming appointment on 12/14/19. Please remember to take some time for yourself.  Mindfulness-Based Stress Reduction Mindfulness-based stress reduction (MBSR) is a program that helps people learn to practice mindfulness. Mindfulness is the practice of intentionally paying attention to the present moment. It can be learned and practiced through techniques such as education, breathing exercises, meditation, and yoga. MBSR includes several mindfulness techniques in one program. MBSR works best when you understand the treatment, are willing to try new things, and can commit to spending time practicing what you learn. MBSR training may include learning about:  How your emotions, thoughts, and reactions affect your body.  New ways to respond to things that cause negative thoughts to start (triggers).  How to notice your thoughts and let go of them.  Practicing awareness of everyday things that you normally do without thinking.  The techniques and goals of different types of meditation. What are the benefits of MBSR? MBSR can have many benefits, which include helping you to:  Develop self-awareness. This refers to knowing and understanding yourself.  Learn skills and attitudes that help you to participate in your own health care.  Learn new ways to care for yourself.  Be more accepting about how things are, and let things go.  Be less judgmental and approach things with an open mind.  Be patient with yourself and trust yourself more. MBSR has also been shown to:  Reduce negative emotions, such as depression and anxiety.  Improve memory and focus.  Change how you sense and approach pain.  Boost your body's ability to fight infections.  Help you connect better with other people.  Improve your sense of well-being. Follow these instructions at home:   Find a local in-person  or online MBSR program.  Set aside some time regularly for mindfulness practice.  Find a mindfulness practice that works best for you. This may include one or more of the following: ? Meditation. Meditation involves focusing your mind on a certain thought or activity. ? Breathing awareness exercises. These help you to stay present by focusing on your breath. ? Body scan. For this practice, you lie down and pay attention to each part of your body from head to toe. You can identify tension and soreness and intentionally relax parts of your body. ? Yoga. Yoga involves stretching and breathing, and it can improve your ability to move and be flexible. It can also provide an experience of testing your body's limits, which can help you release stress. ? Mindful eating. This way of eating involves focusing on the taste, texture, color, and smell of each bite of food. Because this slows down eating and helps you feel full sooner, it can be an important part of a weight-loss plan.  Find a podcast or recording that provides guidance for breathing awareness, body scan, or meditation exercises. You can listen to these any time when you have a free moment to rest without distractions.  Follow your treatment plan as told by your health care provider. This may include taking regular medicines and making changes to your diet or lifestyle as recommended. How to practice mindfulness To do a basic awareness exercise:  Find a comfortable place to sit.  Pay attention to the present moment. Observe your thoughts, feelings, and surroundings just as they are.  Avoid placing judgment on yourself, your feelings, or your surroundings. Make note  of any judgment that comes up, and let it go.  Your mind may wander, and that is okay. Make note of when your thoughts drift, and return your attention to the present moment. To do basic mindfulness meditation:  Find a comfortable place to sit. This may include a stable chair  or a firm floor cushion. ? Sit upright with your back straight. Let your arms fall next to your side with your hands resting on your legs. ? If sitting in a chair, rest your feet flat on the floor. ? If sitting on a cushion, cross your legs in front of you.  Keep your head in a neutral position with your chin dropped slightly. Relax your jaw and rest the tip of your tongue on the roof of your mouth. Drop your gaze to the floor. You can close your eyes if you like.  Breathe normally and pay attention to your breath. Feel the air moving in and out of your nose. Feel your belly expanding and relaxing with each breath.  Your mind may wander, and that is okay. Make note of when your thoughts drift, and return your attention to your breath.  Avoid placing judgment on yourself, your feelings, or your surroundings. Make note of any judgment or feelings that come up, let them go, and bring your attention back to your breath.  When you are ready, lift your gaze or open your eyes. Pay attention to how your body feels after the meditation. Where to find more information You can find more information about MBSR from:  Your health care provider.  Community-based meditation centers or programs.  Programs offered near you. Summary  Mindfulness-based stress reduction (MBSR) is a program that teaches you how to intentionally pay attention to the present moment. It is used with other treatments to help you cope better with daily stress, emotions, and pain.  MBSR focuses on developing self-awareness, which allows you to respond to life stress without judgment or negative emotions.  MBSR programs may involve learning different mindfulness practices, such as breathing exercises, meditation, yoga, body scan, or mindful eating. Find a mindfulness practice that works best for you, and set aside time for it on a regular basis. This information is not intended to replace advice given to you by your health care  provider. Make sure you discuss any questions you have with your health care provider. Document Revised: 03/13/2017 Document Reviewed: 08/07/2016 Elsevier Patient Education  2020 [place]  Ms. Dise was given information about Medicaid Managed Care team care coordination services and consented to engagement with the [facility] Managed Care team.   Goals Addressed              This Visit's Progress   .  "I need help getting a primary care doctor" (pt-stated)        [place] Medicaid Managed Care (see longitudinal plan of care for additional care plan information)  Current Barriers:  . Patient does not have PCP  Nurse Case Manager Clinical Goal(s):  [name] Over the next 7 days, patient will verbalize understanding of plan for new PCP appointment  Interventions:  . Inter-disciplinary care team collaboration (see longitudinal plan of care) . Collaborated with  [facility]) care guide regarding need for PCP referral. Patient willing to take first available appointment.  12/12/19-Pt missed scheduled PCP appt, rescheduled for 12/14/19 @ 10:15  Patient Self Care Activities:  . Patient verbalizes understanding of plan to keep reschedule PCP appt on  12/14/19 @ 10:15. . Unable to independently procure PCP appointment.   Initial goal documentation  12/05/2019 Update:  Patient is scheduled with Tally Due, NP on 12/07/2019. 12/12/19: Patient plans to keep rescheduled PCP appt on 12/14/19. Patient would like to follow up with RNCM on 12/15/19.    [name]  "I want to talk to someone about my breathing and sleeping" (pt-stated)        [place] Medicaid Managed Care (see longitudinal plan of care for additional care plan information)  Current Barriers:  [name] Knowledge Deficits related to appropriate evaluations for sleep study, respiratory support; patient reports her husband observes periods of "not breathing" during the night; patient had sleep study in 2015 but was unable to  afford CPAP at the time; wishes to be re-evaluated; also asks about getting a Ventolin inhaler as she is concerned about breathing problems since the advent of COVID  Nurse Case Manager Clinical Goal(s):  [name] Over the next 14 days, patient will verbalize understanding of plan for referral to new PCP and appropriate evaluations thereafter  Interventions:  . Inter-disciplinary care team collaboration (see longitudinal plan of care) . Collaborated with [facility]  regarding connecting patient to PCP; when new PCP established, MM team will forward notes and requests to PCP  Patient Self Care Activities:  . Patient verbalizes understanding of plan to be connected to new PCP  Initial goal documentation  Update 12/12/19: RNCM encouraged patient to keep scheduled appt with PCP and discuss any health related issues with the provider. Encouraged patient to use relaxation techniques.      [name]  "I would like to talk to someone about counseling" (pt-stated)        [place] (see longitudinal plan of care for additional care plan information)  Current Barriers:  . Care Coordination needs related to counseling resources available in a patient with stated emotional health needs  Nurse Case Manager Clinical Goal(s):  [name] Over the next 14 days, patient will verbalize understanding of plan for counseling support . Over the next 7 days, patient will work with CM clinical social worker to address counseling support needs  Interventions:  . Inter-disciplinary care team collaboration (see longitudinal plan of care) . Evaluation of current treatment plan related to emotional health and patient's adherence to plan as established by provider. Patient states that she desires assistance with mental/emotional health and is not currently connected to a therapist.  . Collaborated with LCSW regarding emotional/mental health needs.  . Discussed plans with patient for ongoing care management follow up  and provided patient with direct contact information for care management team  Patient Self Care Activities:  . Patient verbalizes understanding of plan to work with LCSW for mental/emotional health support . Unable to independently procure long term counseling/therapy support  Initial goal documentation   12/05/2019 Update: Patient verbalized need for outpatient therapy. She discussed her situation at home (she is caring for her nephew's three young children: 70 yo and 38 yo twins, and she has been caring for them for the past 2 years due to CPS involvement). 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  She discussed what she feels led up to her current health issues (her sister passed away 5 years ago when she ws 59 years old. Patient stated she didn't really deal with her death but she also started gaining weight.) Patient discussed other  life events that she feels also contributed to her current feelings of depression. She acknowledged that she needs help to work through her issues. LCSW explained that a referral will be made for her to begin therapy at a local agency. Patient verbalized understanding and was agreeable.  12/12/19: RN CM collaborated with LCSW regarding Outpatient therapy. Referral made on 12/05/19.       Please see education materials related to stress reduction provided by MyChart link.  Patient verbalizes understanding of instructions provided today.   The Managed Medicaid care management team will reach out to the patient again over the next 7 days.  Telephone follow up appointment with Managed Medicaid care management team member scheduled for: Next PCP appointment:  12/14/19 @ 10:15  Lurena Joiner RN, [place] RN Care Coordinator

## 2019-12-14 ENCOUNTER — Ambulatory Visit (INDEPENDENT_AMBULATORY_CARE_PROVIDER_SITE_OTHER): Payer: Medicaid Other | Admitting: Nurse Practitioner

## 2019-12-14 ENCOUNTER — Other Ambulatory Visit: Payer: Self-pay

## 2019-12-14 VITALS — BP 148/90 | HR 60 | Temp 97.1°F | Ht 61.0 in | Wt 229.0 lb

## 2019-12-14 DIAGNOSIS — J4520 Mild intermittent asthma, uncomplicated: Secondary | ICD-10-CM | POA: Diagnosis not present

## 2019-12-14 DIAGNOSIS — I10 Essential (primary) hypertension: Secondary | ICD-10-CM | POA: Diagnosis not present

## 2019-12-14 MED ORDER — ALBUTEROL SULFATE HFA 108 (90 BASE) MCG/ACT IN AERS: 2.0000 | INHALATION_SPRAY | Freq: Four times a day (QID) | RESPIRATORY_TRACT | 0 refills | Status: DC | PRN

## 2019-12-14 NOTE — Progress Notes (Addendum)
'@Patient'  ID: Margaret Aguilar, female    DOB: 1960/11/30, 59 y.o.   MRN: [id number]  Chief Complaint  Patient presents with  . Follow-up    Toe injury is better was given Volteran gel. No longer using.     Referring provider: No ref. provider found   59 y.o. female, with a history of anemia, anxiety, asthma, DM, GERD, gout, hyperlipidemia, HTN, chronic kidney disease.   HPI  Patient presents today for ED follow up.  She was seen in the ED 2 months ago for toe injury.  This has resolved.  She was also found to be hypertensive at the ED visit.  Patient was restarted on her hypertensive medications and has been doing well.  She states that she is compliant with her medications.  She does not have any issues getting medications.  She is requesting an albuterol inhaler be sent to the pharmacy to use as needed for history of asthma.  Patient does have a history of multiple health issues including diabetes, hyperlipidemia, and chronic kidney disease.  She currently does not have a PCP and has not been getting routine medical care.  Patient does have Medicaid.  We will set her up appointment with a PCP during this visit today to further address her chronic medical conditions.  Patient will most likely need a physical with lab work completed by new PCP.  Vital signs are stable today.  Patient did not take her blood pressure medicine this morning before her visit here.  Patient did receive first Covid vaccine and has appointment scheduled for second.  She will take it when she gets home.  Denies f/c/s, n/v/d, hemoptysis, PND, chest pain or edema.      Allergies  Allergen Reactions  . Nutritional Supplements     Walnut >> UNSPECIFIED REACTION  Severity from [place]  . Latex Swelling and Rash    SWELLING REACTION UNSPECIFIED     Immunization History  Administered Date(s) Administered  . Influenza,inj,Quad PF,6+ Mos 01/12/2018  . PPD Test 09/28/2013, 10/12/2013    Past Medical History:   Diagnosis Date  . Anemia   . Anxiety   . Arthritis   . Asthma   . Back pain   . Bradycardia   . Depression   . Diabetes ([place])   . Diabetes mellitus without complication ([facility])    x 5 yrs  . Drug use   . GERD (gastroesophageal reflux disease)   . Gout   . Herpes   . High cholesterol   . HLD (hyperlipidemia)   . Hypertension   . Joint pain   . Kidney disease    Stage 4  . 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 Palpitations   . Pneumonia    several  . Sleep apnea    hasn't gotten cpap yet  . SOB (shortness of breath)   . Vitamin D-dependent rickets     Tobacco History: Social History   Tobacco Use  Smoking Status Never Smoker  Smokeless Tobacco Never Used   Counseling given: Yes   Outpatient Encounter Medications as of 12/14/2019  Medication Sig  . allopurinol (ZYLOPRIM) 100 MG tablet Take 100 mg by mouth daily.  . isosorbide-hydrALAZINE (BIDIL) 20-37.5 MG tablet Take by mouth 2 (two) times daily.  . metoprolol succinate (TOPROL-XL) 100 MG 24 hr tablet Take 50 mg by mouth daily. Take with or immediately following a meal.  . albuterol (VENTOLIN HFA) 108 (90 Base) MCG/ACT inhaler  Inhale 2 puffs into the lungs every 6 (six) hours as needed for wheezing or shortness of breath.  [name] amLODipine (NORVASC) 10 MG tablet Take 10 mg by mouth daily.  [name] amLODipine (NORVASC) 5 MG tablet Take 1 tablet (5 mg total) by mouth daily for 30 days. (Patient taking differently: Take 10 mg by mouth daily. )   No facility-administered encounter medications on file as of 12/14/2019.     Review of Systems  Review of Systems  Constitutional: Negative.  Negative for chills and fever.  HENT: Negative.   Respiratory: Negative for cough and shortness of breath.   Cardiovascular: Negative.  Negative for chest pain, palpitations and leg swelling.  Gastrointestinal: Negative.   Allergic/Immunologic: Negative.   Neurological: Negative.   Psychiatric/Behavioral:  Negative.        Physical Exam  BP (!) 148/90 (BP Location: Right Arm)   Pulse 60   Temp (!) 97.1 F (36.2 C)   Ht '5\' 1"'  (1.549 m)   Wt 229 lb (103.9 kg)   LMP  (LMP Unknown)   SpO2 99%   BMI 43.27 kg/m   Wt Readings from Last 5 Encounters:  12/14/19 229 lb (103.9 kg)  11/23/19 221 lb (100.2 kg)  11/03/19 242 lb 8.1 oz (110 kg)  09/29/19 225 lb 12.8 oz (102.4 kg)  06/30/19 225 lb 1.4 oz (102.1 kg)     Physical Exam Vitals and nursing note reviewed.  Constitutional:      General: She is not in acute distress.    Appearance: She is well-developed.  Cardiovascular:     Rate and Rhythm: Normal rate and regular rhythm.  Pulmonary:     Effort: Pulmonary effort is normal.     Breath sounds: Normal breath sounds.  Musculoskeletal:     Right lower leg: No edema.     Left lower leg: No edema.  Neurological:     Mental Status: She is alert and oriented to person, place, and time.  Psychiatric:        Mood and Affect: Mood normal.        Behavior: Behavior normal.       Assessment & Plan:   Essential hypertension Continue current medications as directed  Low sodium diet  Stay active  Will set up appointment for new PCP to address routtine health maintenance  Asthma:  Ordered albuterol inhaler to use as needed  Toe Injury:  Resolved    Follow up:  Follow up as needed      [name], NP 12/14/2019

## 2019-12-14 NOTE — Assessment & Plan Note (Signed)
Continue current medications as directed  Low sodium diet  Stay active  Will set up appointment for new PCP to address routtine health maintenance  Asthma:  Ordered albuterol inhaler to use as needed  Toe Injury:  Resolved    Follow up:  Follow up as needed

## 2019-12-14 NOTE — Patient Instructions (Addendum)
Hypertension:  Continue current medications as directed  Low sodium diet  Stay active  Will set up appointment for new PCP to address routtine health maintenance  Asthma:  Ordered albuterol inhaler to use as needed  Toe Injury:  Resolved    Follow up:  Follow up as needed

## 2019-12-15 ENCOUNTER — Other Ambulatory Visit: Payer: Self-pay | Admitting: *Deleted

## 2019-12-15 NOTE — Patient Outreach (Signed)
Care Coordination - Case Manager  12/15/2019  Margaret Aguilar [facility] 1960/08/16 [id number]  Subjective:  Margaret Aguilar is an 59 y.o. year old female who is a primary patient of Patient, No Pcp Per.  Margaret Aguilar was given information about Medicaid Managed Care team care coordination services today. Margaret Aguilar agreed to services and verbal consent obtained  Review of patient status, laboratory and other test data was performed as part of evaluation for provision of services.  SDOH: SDOH Screenings   Alcohol Screen:   . Last Alcohol Screening Score (AUDIT): Not on file  Depression (PHQ2-9): Medium Risk  . PHQ-2 Score: 21  Financial Resource Strain: Medium Risk  . Difficulty of Paying Living Expenses: Somewhat hard  Food Insecurity: No Food Insecurity  . Worried About Charity fundraiser in the Last Year: Never true  . Ran Out of Food in the Last Year: Never true  Tobacco Use: Low Risk   . Smoking Tobacco Use: Never Smoker  . Smokeless Tobacco Use: Never Used  Transportation Needs: No Transportation Needs  . Lack of Transportation (Medical): No  . Lack of Transportation (Non-Medical): No   SDOH Interventions     Most Recent Value  SDOH Interventions  Financial Strain Interventions --  [Encouraged patient to reapply for disablility]  Transportation Interventions Intervention Not Indicated      Objective:    Allergies  Allergen Reactions  . Nutritional Supplements     Walnut >> UNSPECIFIED REACTION  Severity from [name]  . Latex Swelling and Rash    SWELLING REACTION UNSPECIFIED     Medications:    Medications Reviewed Today    Reviewed by [name], NP (Nurse Practitioner) on 12/14/19 at [place] List Status: <None>  Medication Order Taking? Sig Documenting Provider Last Dose Status Informant  albuterol (VENTOLIN HFA) 108 (90 Base) MCG/ACT inhaler [id number]  Inhale 2 puffs into the lungs every 6 (six) hours as needed for wheezing or shortness  of breath. [name], NP  Active   allopurinol (ZYLOPRIM) 100 MG tablet [id number] Yes Take 100 mg by mouth daily. [provider] Taking Active Self  amLODipine (NORVASC) 10 MG tablet [id number]  Take 10 mg by mouth daily. [provider]  Active   amLODipine (NORVASC) 5 MG tablet [id number]  Take 1 tablet (5 mg total) by mouth daily for 30 days.  Patient taking differently: Take 10 mg by mouth daily.    [name], MD  Expired 12/12/19 2359 Self           Med Note [name] Dec 01, 2019  2:27 PM) Has on hand; 10mg   isosorbide-hydrALAZINE (BIDIL) 20-37.5 MG tablet [id number] Yes Take by mouth 2 (two) times daily. [provider] Taking Active   metoprolol succinate (TOPROL-XL) 100 MG 24 hr tablet [id number] Yes Take 50 mg by mouth daily. Take with or immediately following a meal. [provider] Taking Active Self          Assessment:   Goals Addressed              This Visit's Progress   .  "I need help getting a primary care doctor" (pt-stated)        [place] Medicaid Managed Care (see longitudinal plan of care for additional care plan information)  Current Barriers:  . Patient does not have PCP  Nurse Case Manager Clinical Goal(s):  [name] Over the next 7 days,  patient will verbalize understanding of plan for new PCP appointment  Interventions:  . Inter-disciplinary care team collaboration (see longitudinal plan of care) . Collaborated with  [facility]) care guide regarding need for PCP referral. Patient willing to take first available appointment.  Attended appointment on 9/1 for follow up of Emergency Department visit. PCP appointment scheduled for 12/22/19  Patient Self Care Activities:  . Patient verbalizes understanding of plan to keep reschedule PCP appt on 12/22/19  . Unable to independently procure PCP appointment.   Please see past updates related to this goal by clicking on the "Past Updates" button in the  selected goal      .  "I want to talk to someone about my breathing and sleeping" (pt-stated)        [name] (see longitudinal plan of care for additional care plan information)  Current Barriers:  [name] Knowledge Deficits related to appropriate evaluations for sleep study, respiratory support; patient reports her husband observes periods of "not breathing" during the night; patient had sleep study in 2015 but was unable to afford CPAP at the time; wishes to be re-evaluated; also asks about getting a Ventolin inhaler as she is concerned about breathing problems since the advent of COVID  Nurse Case Manager Clinical Goal(s):  [name] Over the next 14 days, patient will verbalize understanding of plan for referral to new PCP and appropriate evaluations thereafter  Interventions:  . Inter-disciplinary care team collaboration (see longitudinal plan of care) . Discussed plans with patient for ongoing care management follow up and provided patient with direct contact information for care management team . Reviewed scheduled/upcoming provider appointments including: New PCP appointment on 9/9 @ 8 am  Patient Self Care Activities:  . Patient verbalizes understanding of plan to be connected to new PCP and to inform PCP of any concerns related to her breathing and difficulty sleeping . Patient verbalizes ways to care for herself by engaging in light exercise and self care activities  Please see past updates related to this goal by clicking on the "Past Updates" button in the selected goal       .  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   Nurse Case Manager Clinical Goal(s):  [name] Over the next 30 days, patient will verbalize understanding of plan  for reapplying for disabilty benefits . Over the next 30 days, patient will work with [place] Team to address needs related to financial assistance  Interventions:  . Inter-disciplinary care team collaboration (see longitudinal plan of care) . Advised patient to reapply for disability benefits . Collaborated with MM Care Team regarding disability process  Patient Self Care Activities:  . Patient will apply for disability benefits  Initial goal documentation          Plan: Follow up with RN CM on 01/19/20 @ 1 pm

## 2019-12-15 NOTE — Patient Instructions (Signed)
Visit Information  Ms. Slatter was given information about Medicaid Managed Care team care coordination services and consented to engagement with the [facility] Managed Care team.   Goals Addressed              This Visit's Progress   .  "I need help getting a primary care doctor" (pt-stated)        [place] Medicaid Managed Care (see longitudinal plan of care for additional care plan information)  Current Barriers:  . Patient does not have PCP  Nurse Case Manager Clinical Goal(s):  [name] Over the next 7 days, patient will verbalize understanding of plan for new PCP appointment  Interventions:  . Inter-disciplinary care team collaboration (see longitudinal plan of care) . Collaborated with  [facility]) care guide regarding need for PCP referral. Patient willing to take first available appointment.  Attended appointment on 9/1 for follow up of Emergency Department visit. PCP appointment scheduled for 12/22/19  Patient Self Care Activities:  . Patient verbalizes understanding of plan to keep reschedule PCP appt on 12/22/19  . Unable to independently procure PCP appointment.   Please see past updates related to this goal by clicking on the "Past Updates" button in the selected goal      .  "I want to talk to someone about my breathing and sleeping" (pt-stated)        [place] (see longitudinal plan of care for additional care plan information)  Current Barriers:  [name] Knowledge Deficits related to appropriate evaluations for sleep study, respiratory support; patient reports her husband observes periods of "not breathing" during the night; patient had sleep study in 2015 but was unable to afford CPAP at the time; wishes to be re-evaluated; also asks about getting a Ventolin inhaler as she is concerned about breathing problems since the advent of COVID  Nurse Case Manager Clinical Goal(s):  [name] Over the next 14 days, patient will verbalize understanding of plan for  referral to new PCP and appropriate evaluations thereafter  Interventions:  . Inter-disciplinary care team collaboration (see longitudinal plan of care) . Discussed plans with patient for ongoing care management follow up and provided patient with direct contact information for care management team . Reviewed scheduled/upcoming provider appointments including: New PCP appointment on 9/9 @ 8 am  Patient Self Care Activities:  . Patient verbalizes understanding of plan to be connected to new PCP and to inform PCP of any concerns related to her breathing and difficulty sleeping . Patient verbalizes ways to care for herself by engaging in light exercise and self care activities  Please see past updates related to this goal by clicking on the "Past Updates" button in the selected goal       .  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   Nurse Case Manager Clinical Goal(s):  [name] Over the next 30 days, patient will verbalize understanding of plan for reapplying for disabilty benefits . Over the next 30 days, patient will work with [facility] Team to address needs related to financial assistance  Interventions:  . Inter-disciplinary care team collaboration (see longitudinal plan of care) . Advised patient to reapply for disability benefits . Collaborated with [facility] Care Team regarding disability process  Patient Self Care Activities:  .  Patient will apply for disability benefits  Initial goal documentation           Patient verbalizes understanding of instructions provided today.   The Managed Medicaid care management team will reach out to the patient again over the next 30 days.   Lurena Joiner RN, BSN [place]  [employer] surveyor

## 2019-12-16 NOTE — Patient Outreach (Signed)
Care Coordination - Case Manager  12/16/2019  [name] [facility] 09-Mar-1961 [id number]  Subjective:  Margaret Aguilar is an 59 y.o. year old female who is a primary Margaret Aguilar of Margaret Aguilar, No Pcp Per.  Ms. Angell was given information about Medicaid Managed Care team care coordination services today. Margaret Aguilar agreed to services and verbal consent obtained  Review of Margaret Aguilar status, laboratory and other test data was performed as part of evaluation for provision of services.  SDOH: SDOH Screenings   Alcohol Screen:   . Last Alcohol Screening Score (AUDIT): Not on file  Depression (PHQ2-9): Medium Risk  . PHQ-2 Score: 21  Financial Resource Strain: Medium Risk  . Difficulty of Paying Living Expenses: Somewhat hard  Food Insecurity: No Food Insecurity  . Worried About Charity fundraiser in the Last Year: Never true  . Ran Out of Food in the Last Year: Never true  Housing:   . Last Housing Risk Score: Not on file  Physical Activity:   . Days of Exercise per Week: Not on file  . Minutes of Exercise per Session: Not on file  Social Connections:   . Frequency of Communication with Friends and Family: Not on file  . Frequency of Social Gatherings with Friends and Family: Not on file  . Attends Religious Services: Not on file  . Active Member of Clubs or Organizations: Not on file  . Attends Archivist Meetings: Not on file  . Marital Status: Not on file  Stress:   . Feeling of Stress : Not on file  Tobacco Use: Low Risk   . Smoking Tobacco Use: Never Smoker  . Smokeless Tobacco Use: Never Used  Transportation Needs: No Transportation Needs  . Lack of Transportation (Medical): No  . Lack of Transportation (Non-Medical): No   SDOH Interventions     Most Recent Value  SDOH Interventions  Financial Strain Interventions --  [Encouraged Margaret Aguilar to reapply for disablility]  Transportation Interventions Intervention Not Indicated      Objective:     Allergies  Allergen Reactions  . Nutritional Supplements     Walnut >> UNSPECIFIED REACTION  Severity from [place]  . Latex Swelling and Rash    SWELLING REACTION UNSPECIFIED     Medications:    Medications Reviewed Today    Reviewed by [name], NP (Nurse Practitioner) on 12/14/19 at [place] List Status: <None>  Medication Order Taking? Sig Documenting Provider Last Dose Status Informant  albuterol (VENTOLIN HFA) 108 (90 Base) MCG/ACT inhaler [id number]  Inhale 2 puffs into the lungs every 6 (six) hours as needed for wheezing or shortness of breath. [name], NP  Active   allopurinol (ZYLOPRIM) 100 MG tablet [id number] Yes Take 100 mg by mouth daily. [provider] Taking Active Self  amLODipine (NORVASC) 10 MG tablet [id number]  Take 10 mg by mouth daily. [provider]  Active   amLODipine (NORVASC) 5 MG tablet [id number]  Take 1 tablet (5 mg total) by mouth daily for 30 days.  Margaret Aguilar taking differently: Take 10 mg by mouth daily.    [name], MD  Expired 12/12/19 2359 Self           Med Note [name] Dec 01, 2019  2:27 PM) Has on hand; 10mg   isosorbide-hydrALAZINE (BIDIL) 20-37.5 MG tablet [id number] Yes Take by mouth 2 (two) times daily. [provider] Taking Active   metoprolol succinate (TOPROL-XL) 100 MG  24 hr tablet [id number] Yes Take 50 mg by mouth daily. Take with or immediately following a meal. [provider] Taking Active Self          Assessment:   Goals Addressed              This Visit's Progress   .  "I need help getting a primary care doctor" (pt-stated)        [place] Medicaid Managed Care (see longitudinal plan of care for additional care plan information)  Current Barriers:  . Margaret Aguilar does not have PCP- Scheduled with new PCP Margaret Aguilar on 12/22/19  Nurse Case Manager Clinical Goal(s):  Margaret Aguilar Over the next 7 days, Margaret Aguilar will verbalize understanding of plan for new  PCP appointment  Interventions:  . Inter-disciplinary care team collaboration (see longitudinal plan of care) . Collaborated with  [facility]) care guide regarding need for PCP referral. Margaret Aguilar willing to take first available appointment.  Attended appointment on 9/1 for follow up of Emergency Department visit. PCP appointment scheduled for 12/22/19  Margaret Aguilar Self Care Activities:  . Margaret Aguilar verbalizes understanding of plan to keep reschedule PCP appt on 12/22/19  . Unable to independently procure PCP appointment.   Please see past updates related to this goal by clicking on the "Past Updates" button in the selected goal      .  "I want to talk to someone about my breathing and sleeping" (pt-stated)        [place] (see longitudinal plan of care for additional care plan information)  Current Barriers:  Margaret Aguilar Knowledge Deficits related to appropriate evaluations for sleep study, respiratory support; Margaret Aguilar reports her husband observes periods of "not breathing" during the night; Margaret Aguilar had sleep study in 2015 but was unable to afford CPAP at the time; wishes to be re-evaluated; also asks about getting a Ventolin inhaler as she is concerned about breathing problems since the advent of COVID  Nurse Case Manager Clinical Goal(s):  Margaret Aguilar Over the next 14 days, Margaret Aguilar will verbalize understanding of plan for referral to new PCP and appropriate evaluations thereafter  Interventions:  . Inter-disciplinary care team collaboration (see longitudinal plan of care) . Discussed plans with Margaret Aguilar for ongoing care management follow up and provided Margaret Aguilar with direct contact information for care management team . Reviewed scheduled/upcoming provider appointments including: New PCP appointment on 9/9 @ 8 am  Margaret Aguilar Self Care Activities:  . Margaret Aguilar verbalizes understanding of plan to be connected to new PCP and to inform PCP of any concerns related to her breathing and difficulty  sleeping . Margaret Aguilar verbalizes ways to care for herself by engaging in light exercise and self care activities  Please see past updates related to this goal by clicking on the "Past Updates" button in the selected goal       .  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   Nurse Case Manager Clinical Goal(s):  Margaret Aguilar Over the next 30 days, Margaret Aguilar will verbalize understanding of plan for reapplying for disabilty benefits . Over the next 30 days, Margaret Aguilar will work with [place] Team to address needs related to financial assistance  Interventions:  . Inter-disciplinary care team collaboration (see longitudinal plan of care) . Advised Margaret Aguilar to reapply  for disability benefits . Collaborated with [facility] Care Team regarding disability process  Margaret Aguilar Self Care Activities:  . Margaret Aguilar will apply for disability benefits  Initial goal documentation          Plan: Follow up with RN CM in one month, appointment on 01/19/20 @ 1 pm  Lurena Joiner RN, [place] RN Care Coordinator

## 2019-12-22 ENCOUNTER — Other Ambulatory Visit: Payer: Self-pay

## 2019-12-22 ENCOUNTER — Ambulatory Visit (INDEPENDENT_AMBULATORY_CARE_PROVIDER_SITE_OTHER): Payer: Medicaid Other | Admitting: Nurse Practitioner

## 2019-12-22 ENCOUNTER — Encounter: Payer: Self-pay | Admitting: Nurse Practitioner

## 2019-12-22 VITALS — BP 142/76 | Temp 97.0°F | Ht 62.0 in | Wt 230.0 lb

## 2019-12-22 DIAGNOSIS — G4733 Obstructive sleep apnea (adult) (pediatric): Secondary | ICD-10-CM | POA: Diagnosis not present

## 2019-12-22 DIAGNOSIS — J4520 Mild intermittent asthma, uncomplicated: Secondary | ICD-10-CM

## 2019-12-22 DIAGNOSIS — E119 Type 2 diabetes mellitus without complications: Secondary | ICD-10-CM

## 2019-12-22 DIAGNOSIS — Z23 Encounter for immunization: Secondary | ICD-10-CM | POA: Diagnosis not present

## 2019-12-22 DIAGNOSIS — R809 Proteinuria, unspecified: Secondary | ICD-10-CM | POA: Insufficient documentation

## 2019-12-22 DIAGNOSIS — N184 Chronic kidney disease, stage 4 (severe): Secondary | ICD-10-CM

## 2019-12-22 DIAGNOSIS — Z9989 Dependence on other enabling machines and devices: Secondary | ICD-10-CM

## 2019-12-22 DIAGNOSIS — I10 Essential (primary) hypertension: Secondary | ICD-10-CM

## 2019-12-22 DIAGNOSIS — R29818 Other symptoms and signs involving the nervous system: Secondary | ICD-10-CM | POA: Diagnosis not present

## 2019-12-22 DIAGNOSIS — Z0000 Encounter for general adult medical examination without abnormal findings: Secondary | ICD-10-CM

## 2019-12-22 LAB — POCT URINALYSIS DIPSTICK
Bilirubin, UA: NEGATIVE
Glucose, UA: NEGATIVE
Ketones, UA: NEGATIVE
Leukocytes, UA: NEGATIVE
Nitrite, UA: NEGATIVE
Protein, UA: POSITIVE — AB
Spec Grav, UA: 1.025 (ref 1.010–1.025)
Urobilinogen, UA: 0.2 E.U./dL
pH, UA: 6 (ref 5.0–8.0)

## 2019-12-22 LAB — POCT GLYCOSYLATED HEMOGLOBIN (HGB A1C)
HbA1c POC (<> result, manual entry): 5.7 % (ref 4.0–5.6)
HbA1c, POC (controlled diabetic range): 5.7 % (ref 0.0–7.0)
HbA1c, POC (prediabetic range): 5.7 % (ref 5.7–6.4)
Hemoglobin A1C: 5.7 % — AB (ref 4.0–5.6)

## 2019-12-22 LAB — GLUCOSE, POCT (MANUAL RESULT ENTRY): POC Glucose: 94 mg/dl (ref 70–99)

## 2019-12-22 MED ORDER — ALBUTEROL SULFATE (2.5 MG/3ML) 0.083% IN NEBU: 2.5000 mg | INHALATION_SOLUTION | Freq: Four times a day (QID) | RESPIRATORY_TRACT | 11 refills | Status: AC | PRN

## 2019-12-22 MED ORDER — MONTELUKAST SODIUM 10 MG PO TABS: 10.0000 mg | ORAL_TABLET | Freq: Every day | ORAL | 3 refills | Status: DC

## 2019-12-22 NOTE — Progress Notes (Signed)
.  [facility] [place], [place]  [address] Phone:  [phone]   Fax:  [phone]   New Patient Office Visit  Subjective:  Patient ID: Margaret Aguilar, female    DOB: Aug 06, 1960  Age: 59 y.o. MRN: [id number]  CC:  Chief Complaint  Patient presents with   Establish Care    Pt states she has issues with sleeping @ night and she states her husband notice she stops breathing sometimes. Pt also states since covid she has to use her daughter in law breathing  machine.    HPI Margaret Aguilar presents to establish care.  She is concerned that she may have sleep apnea.  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   She is going to start therapy in a few days. She is under increased stress related to helping to care for her nephew's children 44-year-old set of fraternal twins born girl.  They have additional siblings that are being cared for by her sister not the biological grandmother.  She admits that she has a history of sleep apnea.  She was diagnosed several years ago however she is never started to use CPAP.  She is concerned that her symptoms are getting worse per her husband.  Hypertension Patient is here for follow-up of elevated blood pressure. She is not exercising and is not adherent to a low-salt diet. Blood pressure is not well controlled at home. Cardiac symptoms: fatigue and paroxysmal nocturnal dyspnea. Patient denies chest pain, chest pressure/discomfort, dyspnea, exertional chest pressure/discomfort,  irregular heart beat, lower extremity edema, near-syncope, orthopnea, palpitations and syncope. Cardiovascular risk factors: diabetes mellitus, dyslipidemia, hypertension, microalbuminuria, obesity (BMI >= 30 kg/m2) and sedentary lifestyle. Use of agents associated with hypertension: none. History of target organ damage: chronic kidney disease.  Diabetes Mellitus Patient presents for follow up of diabetes. Current symptoms include: none. Symptoms have stabilized. Patient denies foot ulcerations, hyperglycemia, hypoglycemia , increased appetite, nausea, paresthesia of the feet, polydipsia, polyuria, vomiting and weight loss. Evaluation to date has included: fasting blood sugar, fasting lipid panel, hemoglobin A1C and microalbuminuria.  Home sugars: patient does not check sugars.  Current treatment is diet and exercise  Gout Patient here for evaluation of chronic gout. Attacks occur primarily in her foot . Patient reports her chronic pain is stable, her joint stiffness is stable and her joint swelling is stable. Limitation on activities include none. The patient is not avoiding high purine foods and reports consuming 0 alcoholic drinks per. She does use marijuana. She admits that she is going to try to quit.  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)    Palpitations    Pneumonia    several   Sleep apnea    hasn't gotten cpap yet   SOB (shortness of breath)    Vitamin D-dependent rickets     Past Surgical History:  Procedure Laterality Date   AV FISTULA  PLACEMENT Left 10/19/2018   Procedure: LEFT ARTERIOVENOUS (AV) FISTULA CREATION;  Surgeon: Waynetta Sandy, MD;  Location: [facility];  Service: Vascular;  Laterality: Left;   [place] Left 06/30/2019   Procedure: Bascilic Vein Transposition;  Surgeon: Waynetta Sandy, MD;  Location: [facility];  Service: Vascular;  Laterality: Left;   CESAREAN SECTION     x 1   COLONOSCOPY N/A 01/12/2014   Procedure: COLONOSCOPY;  Surgeon: Rogene Houston, MD;  [facility];  Service: Endoscopy;  Laterality: N/A;  19   D&C     TUBAL LIGATION      Family History  Problem Relation Age of Onset   Diabetes Mother    Hyperlipidemia Mother    Depression Mother    Anxiety disorder Mother    Sleep apnea Mother    Eating disorder Mother    Obesity Mother    Hyperlipidemia Father    Hypertension Father    Heart disease Father    Sleep apnea Father    Alcoholism Father    Drug abuse Father    Stroke Brother    Diabetes Brother     Social History   Socioeconomic History   Marital status: Married    Spouse name: Nadara Mustard   Number of children: Not on file   Years of education: Not on file   Highest education level: Not on file  Occupational History   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  Tobacco Use   Smoking status: Never Smoker   Smokeless tobacco: Never Used  Scientific laboratory technician Use: Never used  Substance and Sexual Activity   Alcohol use: No   Drug use: Yes    Types: Marijuana   Sexual activity: Not Currently    Birth control/protection: Post-menopausal  Other Topics Concern   Not on file  Social History Narrative   Not on file   Social Determinants of Health   Financial Resource Strain: Medium Risk   Difficulty of Paying Living Expenses: Somewhat hard  Food Insecurity: No Food Insecurity   Worried About Charity fundraiser in the Last Year: Never true   Ran Out of Food in the Last Year: Never true    Transportation Needs: No Transportation Needs   Lack of Transportation (Medical): No   Lack of Transportation (Non-Medical): No  Physical Activity:    Days of Exercise per Week: Not on file   Minutes of Exercise per Session: Not on file  Stress:    Feeling of Stress : Not on file  Social Connections:    Frequency of Communication with Friends and Family: Not on file   Frequency of Social Gatherings with Friends and Family: Not on file   Attends Religious Services: Not on file   Active Member of Clubs or Organizations: Not on file   Attends Archivist Meetings: Not on file   Marital Status: Not on file  Intimate Partner Violence:    Fear of Current or Ex-Partner: Not on file   Emotionally Abused: Not on file   Physically Abused: Not on file  Sexually Abused: Not on file    ROS Review of Systems  Constitutional: Positive for fatigue.  HENT:       Head congestion    Objective:   Today's Vitals: BP (!) 142/76 (BP Location: Right Arm, Patient Position: Sitting, Cuff Size: Large)    Temp (!) 97 F (36.1 C)    Ht 5' 2" (1.575 m)    Wt 230 lb (104.3 kg)    LMP  (LMP Unknown)    SpO2 99%    BMI 42.07 kg/m   Physical Exam Constitutional:      Appearance: She is obese.  HENT:     Head: Normocephalic and atraumatic.     Nose: Nose normal.     Mouth/Throat:     Mouth: Mucous membranes are moist.  Cardiovascular:     Rate and Rhythm: Normal rate and regular rhythm.     Pulses: Normal pulses.     Heart sounds: Normal heart sounds.  Pulmonary:     Effort: Pulmonary effort is normal.     Breath sounds: Normal breath sounds.  Abdominal:     Palpations: Abdomen is soft.     Comments: hypoactive  Musculoskeletal:        General: Normal range of motion.     Cervical back: Normal range of motion.  Skin:    General: Skin is warm and dry.     Capillary Refill: Capillary refill takes less than 2 seconds.     Comments: Fistula in left upper arm   Neurological:     General: No focal deficit present.     Mental Status: She is alert and oriented to person, place, and time.  Psychiatric:        Mood and Affect: Mood normal.        Behavior: Behavior normal.        Thought Content: Thought content normal.        Judgment: Judgment normal.     Assessment & Plan:   Problem List Items Addressed This Visit      Cardiovascular and Mediastinum   Essential hypertension Encouraged on going compliance with current medication regimen Encouraged home monitoring and recording BP <130/80 Eating a heart-healthy diet with less salt Encouraged regular physical activity  Recommend Weight loss        Respiratory   Mild intermittent asthma without complication New treatment plan   Relevant Medications   montelukast (SINGULAIR) 10 MG tablet   albuterol (PROVENTIL) (2.5 MG/3ML) 0.083% nebulizer solution   Other Relevant Orders   For home use only DME Nebulizer machine   Obstructive sleep apnea on CPAP   Relevant Orders   Ambulatory referral to Sleep Studies     Endocrine   Diabetes ([place]) - Primary (Chronic) Consider home glucose monitoring Weight loss at least 5% of current body weight is can be achieved with lifestyle modification dietary changes and regular daily exercise Encourage blood pressure control goal <120/80 and maintaining total cholesterol <200 Follow-up every 3 to 6 months for reevaluation    Relevant Orders   Urinalysis Dipstick (Completed)   POC Glucose (CBG) (Completed)   POC HgB A1c (Completed)   Microalbumin, urine (Completed)     Genitourinary   CKD (chronic kidney disease) stage 4, GFR 15-29 ml/min (HCC) Follow up with nephrology as scheduled    Other Visit Diagnoses    Suspected sleep apnea     Referral for sleep medicine to evaluate suspected sleep apnea   Healthcare maintenance  Outpatient Encounter Medications as of 12/22/2019  Medication Sig   albuterol (VENTOLIN HFA) 108 (90 Base)  MCG/ACT inhaler Inhale 2 puffs into the lungs every 6 (six) hours as needed for wheezing or shortness of breath.   allopurinol (ZYLOPRIM) 100 MG tablet Take 100 mg by mouth daily.   amLODipine (NORVASC) 10 MG tablet Take 10 mg by mouth daily.   isosorbide-hydrALAZINE (BIDIL) 20-37.5 MG tablet Take by mouth 2 (two) times daily.   metoprolol succinate (TOPROL-XL) 100 MG 24 hr tablet Take 50 mg by mouth daily. Take with or immediately following a meal.   albuterol (PROVENTIL) (2.5 MG/3ML) 0.083% nebulizer solution Take 3 mLs (2.5 mg total) by nebulization every 6 (six) hours as needed for wheezing or shortness of breath.   montelukast (SINGULAIR) 10 MG tablet Take 1 tablet (10 mg total) by mouth at bedtime.   [DISCONTINUED] amLODipine (NORVASC) 5 MG tablet Take 1 tablet (5 mg total) by mouth daily for 30 days. (Patient taking differently: Take 10 mg by mouth daily. )   No facility-administered encounter medications on file as of 12/22/2019.    Follow-up: Return in about 2 months (around 02/21/2020) for well woman [place], NP

## 2019-12-22 NOTE — Patient Instructions (Signed)
Healthy Eating Following a healthy eating pattern may help you to achieve and maintain a healthy body weight, reduce the risk of chronic disease, and live a long and productive life. It is important to follow a healthy eating pattern at an appropriate calorie level for your body. Your nutritional needs should be met primarily through food by choosing a variety of nutrient-rich foods. What are tips for following this plan? Reading food labels  Read labels and choose the following: ? Reduced or low sodium. ? Juices with 100% fruit juice. ? Foods with low saturated fats and high polyunsaturated and monounsaturated fats. ? Foods with whole grains, such as whole wheat, cracked wheat, brown rice, and wild rice. ? Whole grains that are fortified with folic acid. This is recommended for women who are pregnant or who want to become pregnant.  Read labels and avoid the following: ? Foods with a lot of added sugars. These include foods that contain brown sugar, corn sweetener, corn syrup, dextrose, fructose, glucose, high-fructose corn syrup, honey, invert sugar, lactose, malt syrup, maltose, molasses, raw sugar, sucrose, trehalose, or turbinado sugar.  Do not eat more than the following amounts of added sugar per day:  6 teaspoons (25 g) for women.  9 teaspoons (38 g) for men. ? Foods that contain processed or refined starches and grains. ? Refined grain products, such as white flour, degermed cornmeal, white bread, and white rice. Shopping  Choose nutrient-rich snacks, such as vegetables, whole fruits, and nuts. Avoid high-calorie and high-sugar snacks, such as potato chips, fruit snacks, and candy.  Use oil-based dressings and spreads on foods instead of solid fats such as butter, stick margarine, or cream cheese.  Limit pre-made sauces, mixes, and "instant" products such as flavored rice, instant noodles, and ready-made pasta.  Try more plant-protein sources, such as tofu, tempeh, black beans,  edamame, lentils, nuts, and seeds.  Explore eating plans such as the Mediterranean diet or vegetarian diet. Cooking  Use oil to saut or stir-fry foods instead of solid fats such as butter, stick margarine, or lard.  Try baking, boiling, grilling, or broiling instead of frying.  Remove the fatty part of meats before cooking.  Steam vegetables in water or broth. Meal planning   At meals, imagine dividing your plate into fourths: ? One-half of your plate is fruits and vegetables. ? One-fourth of your plate is whole grains. ? One-fourth of your plate is protein, especially lean meats, poultry, eggs, tofu, beans, or nuts.  Include low-fat dairy as part of your daily diet. Lifestyle  Choose healthy options in all settings, including home, work, school, restaurants, or stores.  Prepare your food safely: ? Wash your hands after handling raw meats. ? Keep food preparation surfaces clean by regularly washing with hot, soapy water. ? Keep raw meats separate from ready-to-eat foods, such as fruits and vegetables. ? Cook seafood, meat, poultry, and eggs to the recommended internal temperature. ? Store foods at safe temperatures. In general:  Keep cold foods at 59F (4.4C) or below.  Keep hot foods at 159F (60C) or above.  Keep your freezer at [facility] (-17.8C) or below.  Foods are no longer safe to eat when they have been between the temperatures of 40-159F (4.4-60C) for more than 2 hours. What foods should I eat? Fruits Aim to eat 2 cup-equivalents of fresh, canned (in natural juice), or frozen fruits each day. Examples of 1 cup-equivalent of fruit include 1 small apple, 8 large strawberries, 1 cup canned fruit,  cup  dried fruit, or 1 cup 100% juice. Vegetables Aim to eat 2-3 cup-equivalents of fresh and frozen vegetables each day, including different varieties and colors. Examples of 1 cup-equivalent of vegetables include 2 medium carrots, 2 cups raw, leafy greens, 1 cup chopped  vegetable (raw or cooked), or 1 medium baked potato. Grains Aim to eat 6 ounce-equivalents of whole grains each day. Examples of 1 ounce-equivalent of grains include 1 slice of bread, 1 cup ready-to-eat cereal, 3 cups popcorn, or  cup cooked rice, pasta, or cereal. Meats and other proteins Aim to eat 5-6 ounce-equivalents of protein each day. Examples of 1 ounce-equivalent of protein include 1 egg, 1/2 cup nuts or seeds, or 1 tablespoon (16 g) peanut butter. A cut of meat or fish that is the size of a deck of cards is about 3-4 ounce-equivalents.  Of the protein you eat each week, try to have at least 8 ounces come from seafood. This includes salmon, trout, herring, and anchovies. Dairy Aim to eat 3 cup-equivalents of fat-free or low-fat dairy each day. Examples of 1 cup-equivalent of dairy include 1 cup (240 mL) milk, 8 ounces (250 g) yogurt, 1 ounces (44 g) natural cheese, or 1 cup (240 mL) fortified soy milk. Fats and oils  Aim for about 5 teaspoons (21 g) per day. Choose monounsaturated fats, such as canola and olive oils, avocados, peanut butter, and most nuts, or polyunsaturated fats, such as sunflower, corn, and soybean oils, walnuts, pine nuts, sesame seeds, sunflower seeds, and flaxseed. Beverages  Aim for six 8-oz glasses of water per day. Limit coffee to three to five 8-oz cups per day.  Limit caffeinated beverages that have added calories, such as soda and energy drinks.  Limit alcohol intake to no more than 1 drink a day for nonpregnant women and 2 drinks a day for men. One drink equals 12 oz of beer (355 mL), 5 oz of wine (148 mL), or 1 oz of hard liquor (44 mL). Seasoning and other foods  Avoid adding excess amounts of salt to your foods. Try flavoring foods with herbs and spices instead of salt.  Avoid adding sugar to foods.  Try using oil-based dressings, sauces, and spreads instead of solid fats. This information is based on general [place] nutrition guidelines. For more  information, visit Build[employer].es. Exact amounts may vary based on your nutrition needs. Summary  A healthy eating plan may help you to maintain a healthy weight, reduce the risk of chronic diseases, and stay active throughout your life.  Plan your meals. Make sure you eat the right portions of a variety of nutrient-rich foods.  Try baking, boiling, grilling, or broiling instead of frying.  Choose healthy options in all settings, including home, work, school, restaurants, or stores. This information is not intended to replace advice given to you by your health care provider. Make sure you discuss any questions you have with your health care provider. Document Revised: 07/13/2017 Document Reviewed: 07/13/2017 Elsevier Patient Education  [place].

## 2019-12-23 LAB — MICROALBUMIN, URINE: Microalbumin, Urine: 1027.3 ug/mL

## 2019-12-26 ENCOUNTER — Telehealth: Payer: Self-pay | Admitting: Nurse Practitioner

## 2019-12-26 DIAGNOSIS — M1990 Unspecified osteoarthritis, unspecified site: Secondary | ICD-10-CM | POA: Diagnosis not present

## 2019-12-26 DIAGNOSIS — N189 Chronic kidney disease, unspecified: Secondary | ICD-10-CM | POA: Diagnosis not present

## 2019-12-26 DIAGNOSIS — N185 Chronic kidney disease, stage 5: Secondary | ICD-10-CM | POA: Diagnosis not present

## 2019-12-26 DIAGNOSIS — R001 Bradycardia, unspecified: Secondary | ICD-10-CM | POA: Diagnosis not present

## 2019-12-26 DIAGNOSIS — M109 Gout, unspecified: Secondary | ICD-10-CM | POA: Diagnosis not present

## 2019-12-26 DIAGNOSIS — N2581 Secondary hyperparathyroidism of renal origin: Secondary | ICD-10-CM | POA: Diagnosis not present

## 2019-12-26 DIAGNOSIS — I120 Hypertensive chronic kidney disease with stage 5 chronic kidney disease or end stage renal disease: Secondary | ICD-10-CM | POA: Diagnosis not present

## 2019-12-26 DIAGNOSIS — I770 Arteriovenous fistula, acquired: Secondary | ICD-10-CM | POA: Diagnosis not present

## 2019-12-26 DIAGNOSIS — E785 Hyperlipidemia, unspecified: Secondary | ICD-10-CM | POA: Diagnosis not present

## 2019-12-26 DIAGNOSIS — T50905A Adverse effect of unspecified drugs, medicaments and biological substances, initial encounter: Secondary | ICD-10-CM | POA: Diagnosis not present

## 2019-12-26 DIAGNOSIS — E1129 Type 2 diabetes mellitus with other diabetic kidney complication: Secondary | ICD-10-CM | POA: Diagnosis not present

## 2019-12-26 DIAGNOSIS — D631 Anemia in chronic kidney disease: Secondary | ICD-10-CM | POA: Diagnosis not present

## 2019-12-26 NOTE — Telephone Encounter (Signed)
Margaret Aguilar 12/26/2019 Called pt regarding community resource referral received. Left message for pt to call me back, my info is [phone] please see ref notes for more details.  Margaret Aguilar Care Guide, Embedded Care Coordination [place], Care Management    

## 2019-12-27 ENCOUNTER — Telehealth: Payer: Self-pay | Admitting: Nurse Practitioner

## 2019-12-27 DIAGNOSIS — J4520 Mild intermittent asthma, uncomplicated: Secondary | ICD-10-CM | POA: Diagnosis not present

## 2019-12-27 NOTE — Telephone Encounter (Signed)
Margaret Aguilar 12/27/2019 Called pt regarding community resource referral received. Left message for pt to call me back, my info is [phone] please see ref notes for more details.  Margaret Aguilar Care Guide, Embedded Care Coordination [place], Care Management    

## 2019-12-28 MED ORDER — TETANUS-DIPHTHERIA TOXOIDS TD 5-2 LFU IM INJ: 0.5000 mL | INJECTION | Freq: Once | INTRAMUSCULAR | Status: DC

## 2019-12-29 DIAGNOSIS — Z23 Encounter for immunization: Secondary | ICD-10-CM

## 2019-12-29 DIAGNOSIS — J4520 Mild intermittent asthma, uncomplicated: Secondary | ICD-10-CM | POA: Diagnosis not present

## 2019-12-29 DIAGNOSIS — G4733 Obstructive sleep apnea (adult) (pediatric): Secondary | ICD-10-CM | POA: Diagnosis not present

## 2019-12-29 DIAGNOSIS — I10 Essential (primary) hypertension: Secondary | ICD-10-CM | POA: Diagnosis not present

## 2019-12-29 DIAGNOSIS — Z9989 Dependence on other enabling machines and devices: Secondary | ICD-10-CM | POA: Diagnosis not present

## 2019-12-29 DIAGNOSIS — N184 Chronic kidney disease, stage 4 (severe): Secondary | ICD-10-CM | POA: Diagnosis not present

## 2019-12-29 DIAGNOSIS — E119 Type 2 diabetes mellitus without complications: Secondary | ICD-10-CM | POA: Diagnosis not present

## 2019-12-29 DIAGNOSIS — R29818 Other symptoms and signs involving the nervous system: Secondary | ICD-10-CM | POA: Diagnosis not present

## 2019-12-29 DIAGNOSIS — Z0000 Encounter for general adult medical examination without abnormal findings: Secondary | ICD-10-CM | POA: Diagnosis not present

## 2020-01-03 ENCOUNTER — Telehealth: Payer: Self-pay | Admitting: Nurse Practitioner

## 2020-01-03 NOTE — Telephone Encounter (Signed)
Margaret Aguilar 01/03/2020 Called pt regarding community resource referral received. Left message for pt to call me back, my info is [phone] please see ref notes for more details.  [place], Care Management

## 2020-01-05 ENCOUNTER — Other Ambulatory Visit: Payer: Self-pay

## 2020-01-05 ENCOUNTER — Ambulatory Visit: Payer: Self-pay

## 2020-01-05 NOTE — Patient Outreach (Signed)
Care Coordination  01/05/2020  Margaret Aguilar [facility] [birth year] [id number]  An unsuccessful outreach follow-up telephone call was attempted today. The patient was referred to the case management team for assistance with care management and care coordination.   Follow Up Plan: Telephone follow up appointment with Managed Medicaid care management team member scheduled for: January 12, 2020 at 9:00am.  If patient returns call to provider office, please advise to call Managed [place] at [phone].   Netta Neat, MSW, LCSW Social Work Case Freight forwarder - [place]  Direct [place]: [phone]

## 2020-01-05 NOTE — Patient Instructions (Signed)
An unsuccessful follow-up telephone outreach was attempted today. The patient was referred to the case management team for assistance with care management and care coordination.   Follow Up Plan: Telephone follow up appointment with Managed Medicaid care management team member scheduled for: January 12, 2020 at 9:00am. If patient returns call to provider office, please advise to call Managed [place] at [phone].   Netta Neat, MSW, LCSW Social Work Case Freight forwarder - [place]  Direct [place]: [phone]

## 2020-01-12 ENCOUNTER — Other Ambulatory Visit: Payer: Self-pay

## 2020-01-12 ENCOUNTER — Ambulatory Visit: Payer: Self-pay

## 2020-01-12 NOTE — Patient Outreach (Addendum)
Care Coordination- Social Work  01/12/2020  Margaret Aguilar [facility] [birth year] [id number]  Subjective:    Margaret Aguilar is an 59 y.o. year old female who is a primary patient of [name], NP.    Margaret Aguilar was given information about Medicaid Managed Care team care coordination services today. Margaret Aguilar agreed to services and verbal consent obtained  Review of patient status, laboratory and other test data was performed as part of evaluation for provision of services.  SDOH:   SDOH Screenings   Alcohol Screen: Low Risk   . Last Alcohol Screening Score (AUDIT): 0  Depression (PHQ2-9): Medium Risk  . PHQ-2 Score: 15  Financial Resource Strain: Medium Risk  . Difficulty of Paying Living Expenses: Somewhat hard  Food Insecurity: No Food Insecurity  . Worried About Charity fundraiser in the Last Year: Never true  . Ran Out of Food in the Last Year: Never true  Housing: Low Risk   . Last Housing Risk Score: 0  Physical Activity: Sufficiently Active  . Days of Exercise per Week: 4 days  . Minutes of Exercise per Session: 60 min  Social Connections: Moderately Integrated  . Frequency of Communication with Friends and Family: Three times a week  . Frequency of Social Gatherings with Friends and Family: Three times a week  . Attends Religious Services: More than 4 times per year  . Active Member of Clubs or Organizations: No  . Attends Archivist Meetings: Never  . Marital Status: Married  Stress: Stress Concern Present  . Feeling of Stress : Rather much  Tobacco Use: Low Risk   . Smoking Tobacco Use: Never Smoker  . Smokeless Tobacco Use: Never Used  Transportation Needs: No Transportation Needs  . Lack of Transportation (Medical): No  . Lack of Transportation (Non-Medical): No   SDOH Interventions     Most Recent Value  SDOH Interventions  Housing Interventions Intervention Not Indicated  Intimate Partner Violence Interventions Intervention  Not Indicated  Physical Activity Interventions Intervention Not Indicated  Stress Interventions Offered Community Wellness Resources  [Recommended patient for outpatient therapy.]  Social Connections Interventions Intervention Not Indicated  Alcohol Brief Interventions/Follow-up AUDIT Score <7 follow-up not indicated  Depression Interventions/Treatment  Referral to Psychiatry, Counseling      Objective:    Medications:  Medications Reviewed Today    Reviewed by Netta Neat, LCSW (Social Worker) on 01/12/20 at [place] List Status: <None>  Medication Order Taking? Sig Documenting Provider Last Dose Status Informant  albuterol (PROVENTIL) (2.5 MG/3ML) 0.083% nebulizer solution [id number] Yes Take 3 mLs (2.5 mg total) by nebulization every 6 (six) hours as needed for wheezing or shortness of breath. [name], NP Taking Active   albuterol (VENTOLIN HFA) 108 (90 Base) MCG/ACT inhaler [id number] Yes Inhale 2 puffs into the lungs every 6 (six) hours as needed for wheezing or shortness of breath. [name], NP Taking Active   allopurinol (ZYLOPRIM) 100 MG tablet [id number] Yes Take 100 mg by mouth daily. [provider] Taking Active Self  amLODipine (NORVASC) 10 MG tablet [id number] Yes Take 10 mg by mouth daily. [provider] Taking Active   isosorbide-hydrALAZINE (BIDIL) 20-37.5 MG tablet [id number] Yes Take by mouth 2 (two) times daily. [provider] Taking Active   metoprolol succinate (TOPROL-XL) 100 MG 24 hr tablet [id number] Yes Take 50 mg by mouth daily. Take with or immediately following a meal. [provider] Taking Active Self  montelukast (SINGULAIR)  10 MG tablet [id number] Yes Take 1 tablet (10 mg total) by mouth at bedtime. [name], NP Taking Active           Fall/Depression Screening:  Fall Risk  12/22/2019 12/14/2019 01/12/2018  Falls in the past year? 1 1 No  Number falls in past yr: 0 0 -  Injury with Fall? 0 0 -    PHQ 2/9 Scores 01/12/2020 12/22/2019 11/23/2019 01/12/2018  PHQ - 2 Score 2 0 5 2  PHQ- 9 Score 15 - 21 10  Exception Documentation - Medical reason - -    Assessment:  Goals Addressed              This Visit's Progress   .  COMPLETED: "I need help getting a primary care doctor" (pt-stated)   On track     Margaret Aguilar (see longitudinal plan of care for additional care plan information)  Current Barriers:  . Patient does not have PCP- Scheduled with new PCP [name] on 12/22/19  Nurse Case Manager Clinical Goal(s):  [name] Over the next 7 days, patient will verbalize understanding of plan for new PCP appointment  Interventions:  . Inter-disciplinary care team collaboration (see longitudinal plan of care) . Collaborated with  [facility]) care guide regarding need for PCP referral. Patient willing to take first available appointment.  Attended appointment on 9/1 for follow up of Emergency Department visit. PCP appointment scheduled for 12/22/19. Patient met with and established care with her new PCP on 12/22/2019.  Patient Self Care Activities:  . Patient verbalizes understanding of plan to keep reschedule PCP appt on 12/22/19  . Unable to independently procure PCP appointment.   Please see past updates related to this goal by clicking on the "Past Updates" button in the selected goal      .  "I want to talk to someone about my breathing and sleeping" (pt-stated)   On track     Margaret Aguilar (see longitudinal plan of care for additional care plan information)  Current Barriers:  [name] Knowledge Deficits related to appropriate evaluations for sleep study, respiratory support; patient reports her husband observes periods of "not breathing" during the night; patient had sleep study in 2015 but was unable to afford CPAP at the time; wishes to be re-evaluated; also asks about getting a Ventolin inhaler as she is concerned about breathing problems  since the advent of COVID  Nurse Case Manager Clinical Goal(s):  [name] Over the next 14 days, patient will verbalize understanding of plan for referral to new PCP and appropriate evaluations thereafter  Interventions:  . Inter-disciplinary care team collaboration (see longitudinal plan of care) . Discussed plans with patient for ongoing care management follow up and provided patient with direct contact information for care management team . Reviewed scheduled/upcoming provider appointments including: New PCP appointment on 9/9 @ 8 am  Patient Self Care Activities:  . Patient verbalizes understanding of plan to be connected to new PCP and to inform PCP of any concerns related to her breathing and difficulty sleeping. Patient informed LCSW that she has discussed her breathing and sleeping issues with her PCP. She stated her PCP has established treatment for the breathing and she will be scheduled for a sleep study in the near future. . Patient verbalizes ways to care for herself by engaging in light exercise and self care activities  Please see past updates related to this goal by clicking on the "Past Updates"  button in the selected goal       .  "I would like to talk to someone about counseling" (pt-stated)   On track     [place] (see longitudinal plan of care for additional care plan information)  Current Barriers:  . Care Coordination needs related to counseling resources available in a patient with stated emotional health needs  Nurse Case Manager Clinical Goal(s):  [name] Over the next 14 days, patient will verbalize understanding of plan for counseling support . Over the next 7 days, patient will work with CM clinical social worker to address counseling support needs  Interventions:  . Inter-disciplinary care team collaboration (see longitudinal plan of care) . Evaluation of current treatment plan related to emotional health and patient's adherence to plan as  established by provider. Patient states that she desires assistance with mental/emotional health and is not currently connected to a therapist.  . Collaborated with LCSW regarding emotional/mental health needs.  LCSW will collaborate with BSW to schedule patient with an outpatient therapy provider. Referral sent to BSW to schedule with [place] - [phone]. [name] Discussed plans with patient for ongoing care management follow up and provided patient with direct contact information for care management team  Patient Self Care Activities:  . Patient verbalizes understanding of plan to work with LCSW for mental/emotional health support .  Patient stated to LCSW that she is still very interested in participating in outpatient therapy and is waiting to be scheduled with a provider. . Unable to independently procure long term counseling/therapy support  -  Patient stated to LCSW that she is waiting to be scheduled with an outpatient therapist.   Please see past updates related to this goal by clicking on the "Past Updates" button in the selected goal    12/05/2019 Update: Patient verbalized need for outpatient therapy. She discussed her situation at home (she is caring for her nephew's three young children: 44 yo and 84 yo twins, and she has been caring for them for the past 2 years due to CPS involvement). 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  She discussed what she feels led up to her current health issues (her sister passed away 5 years ago when she was 59 years old. Patient stated she didn't really deal with her death but she also started gaining weight.) Patient discussed other life events that she feels also contributed to her current feelings of depression. She acknowledged that she needs help to work through her issues. LCSW explained that a referral will be made for her to begin  therapy at a local agency. Patient verbalized understanding and was agreeable.  12/12/19: RN CM collaborated with LCSW regarding Outpatient therapy. Referral made on 12/05/19.    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   Nurse Case Manager Clinical Goal(s):  [name] Over the next 30 days, patient will verbalize understanding of plan for reapplying for disabilty benefits . Over the next 30 days, patient will work with [place] Team to address needs related to financial assistance  Interventions:  . Inter-disciplinary care team collaboration (see longitudinal plan of care) . Advised  patient to reapply for disability benefits . Collaborated with [facility] Care Team regarding disability process  Patient Self Care Activities:  . Patient will apply for disability benefits. LCSW note: Patient stated that when she applied 2 years ago, she was approved for back payments but was not approved for ongoing payments because of her husband's income. She stated she will discuss reapplying with her PCP for advisement.   Please see past updates related to this goal by clicking on the "Past Updates" button in the selected goal           Plan: LCSW will follow up with patient in 30 days; appointment scheduled for February 09, 2020 @ [place], MSW, [place]: [phone]

## 2020-01-12 NOTE — Patient Instructions (Addendum)
Visit Information Margaret Aguilar, it was a pleasure speaking with you today. Please remember that a member of the Managed Medicaid Care Management Team will contact you to schedule you for therapy.   Margaret Aguilar was given information about Medicaid Managed Care team care coordination services as a part of their Healthy [facility] Medicaid benefit. Margaret Aguilar verbally consented to engagement with the [facility] Managed Care team.   For questions related to your Healthy [facility] health plan, please call: [phone] or visit the homepage here: [email or web address]  If you would like to schedule transportation through your Healthy [facility] plan, please call the following number at least 2 days in advance of your appointment: [phone]  Goals Addressed              This Visit's Progress   .  COMPLETED: "I need help getting a primary care doctor" (pt-stated)   On track     [place] (see longitudinal plan of care for additional care plan information)  Current Barriers:  . Patient does not have PCP- Scheduled with new PCP Margaret Aguilar on 12/22/19  Nurse Case Manager Clinical Goal(s):  Margaret Aguilar Over the next 7 days, patient will verbalize understanding of plan for new PCP appointment  Interventions:  . Inter-disciplinary care team collaboration (see longitudinal plan of care) . Collaborated with  [facility]) care guide regarding need for PCP referral. Patient willing to take first available appointment.  Attended appointment on 9/1 for follow up of Emergency Department visit. PCP appointment scheduled for 12/22/19. Patient met with and established care with her new PCP on 12/22/2019.  Patient Self Care Activities:  . Patient verbalizes understanding of plan to keep reschedule PCP appt on 12/22/19  . Unable to independently procure PCP appointment.   Please see past updates related to this goal by clicking on the "Past Updates" button  in the selected goal      .  "I want to talk to someone about my breathing and sleeping" (pt-stated)   On track     [place] (see longitudinal plan of care for additional care plan information)  Current Barriers:  Margaret Aguilar Knowledge Deficits related to appropriate evaluations for sleep study, respiratory support; patient reports her husband observes periods of "not breathing" during the night; patient had sleep study in 2015 but was unable to afford CPAP at the time; wishes to be re-evaluated; also asks about getting a Ventolin inhaler as she is concerned about breathing problems since the advent of COVID  Nurse Case Manager Clinical Goal(s):  Margaret Aguilar Over the next 14 days, patient will verbalize understanding of plan for referral to new PCP and appropriate evaluations thereafter  Interventions:  . Inter-disciplinary care team collaboration (see longitudinal plan of care) . Discussed plans with patient for ongoing care management follow up and provided patient with direct contact information for care management team . Reviewed scheduled/upcoming provider appointments including: New PCP appointment on 9/9 @ 8 am  Patient Self Care Activities:  . Patient verbalizes understanding of plan to be connected to new PCP and to inform PCP of any concerns related to her breathing and difficulty sleeping. Patient informed LCSW that she has discussed her breathing and sleeping issues with her PCP. She stated her PCP has established treatment for the breathing and she will be scheduled for a sleep study in the near future. . Patient verbalizes ways to care for herself by engaging in light exercise and self care activities  Please see past updates related to this goal by clicking on the "Past Updates" button in the selected goal       .  "I would like to talk to someone about counseling" (pt-stated)   On track     [place] (see longitudinal plan of care for  additional care plan information)  Current Barriers:  . Care Coordination needs related to counseling resources available in a patient with stated emotional health needs  Nurse Case Manager Clinical Goal(s):  Margaret Aguilar Over the next 14 days, patient will verbalize understanding of plan for counseling support . Over the next 7 days, patient will work with CM clinical social worker to address counseling support needs  Interventions:  . Inter-disciplinary care team collaboration (see longitudinal plan of care) . Evaluation of current treatment plan related to emotional health and patient's adherence to plan as established by provider. Patient states that she desires assistance with mental/emotional health and is not currently connected to a therapist.  . Collaborated with LCSW regarding emotional/mental health needs.  LCSW will collaborate with BSW to schedule patient with an outpatient therapy provider. . Discussed plans with patient for ongoing care management follow up and provided patient with direct contact information for care management team  Patient Self Care Activities:  . Patient verbalizes understanding of plan to work with LCSW for mental/emotional health support .  Patient stated to LCSW that she is still very interested in participating in outpatient therapy and is waiting to be scheduled with a provider. . Unable to independently procure long term counseling/therapy support  -  Patient stated to LCSW that she is waiting to be scheduled with an outpatient therapist.   Please see past updates related to this goal by clicking on the "Past Updates" button in the selected goal    12/05/2019 Update: Patient verbalized need for outpatient therapy. She discussed her situation at home (she is caring for her nephew's three young children: 54 yo and 43 yo twins, and she has been caring for them for the past 2 years due to CPS involvement). 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  She discussed what she feels led up to her current health issues (her sister passed away 5 years ago when she was 59 years old. Patient stated she didn't really deal with her death but she also started gaining weight.) Patient discussed other life events that she feels also contributed to her current feelings of depression. She acknowledged that she needs help to work through her issues. LCSW explained that a referral will be made for her to begin therapy at a local agency. Patient verbalized understanding and was agreeable.  12/12/19: RN CM collaborated with LCSW regarding Outpatient therapy. Referral made on 12/05/19.    Margaret Aguilar  "Should I reapply for disability" (pt-stated)   Not on track     [place] (see longitudinal plan of care for additional care plan information)  Current Barriers:  Margaret Aguilar Knowledge Deficits related to applying for Disability benefits . 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   Nurse Case Manager Clinical Goal(s):  Margaret Aguilar Over the next 30 days, patient will verbalize understanding of plan for reapplying for disabilty benefits . Over the next 30 days, patient will work with [place] Team to address needs related to financial assistance  Interventions:  . Inter-disciplinary care team collaboration (see longitudinal plan of care) .  Advised patient to reapply for disability benefits . Collaborated with [facility] Care Team regarding disability process  Patient Self Care Activities:  . Patient will apply for disability benefits. LCSW note: Patient stated that when she applied 2 years ago, she was approved for back payments but was not approved for ongoing payments because of her husband's income. She stated she will discuss reapplying with her PCP for advisement.   Please see past updates related to this goal by clicking on the "Past Updates" button in the selected goal           Please see education  materials related to living with depression provided below.   Living With Depression Everyone experiences occasional disappointment, sadness, and loss in their lives. When you are feeling down, blue, or sad for at least 2 weeks in a row, it may mean that you have depression. Depression can affect your thoughts and feelings, relationships, daily activities, and physical health. It is caused by changes in the way your brain functions. If you receive a diagnosis of depression, your health care provider will tell you which type of depression you have and what treatment options are available to you. If you are living with depression, there are ways to help you recover from it and also ways to prevent it from coming back. How to cope with lifestyle changes Coping with stress     Stress is your body's reaction to life changes and events, both good and bad. Stressful situations may include:  Getting married.  The death of a spouse.  Losing a job.  Retiring.  Having a baby. Stress can last just a few hours or it can be ongoing. Stress can play a major role in depression, so it is important to learn both how to cope with stress and how to think about it differently. Talk with your health care provider or a counselor if you would like to learn more about stress reduction. He or she may suggest some stress reduction techniques, such as:  Music therapy. This can include creating music or listening to music. Choose music that you enjoy and that inspires you.  Mindfulness-based meditation. This kind of meditation can be done while sitting or walking. It involves being aware of your normal breaths, rather than trying to control your breathing.  Centering prayer. This is a kind of meditation that involves focusing on a spiritual word or phrase. Choose a word, phrase, or sacred image that is meaningful to you and that brings you peace.  Deep breathing. To do this, expand your stomach and inhale slowly  through your nose. Hold your breath for 3-5 seconds, then exhale slowly, allowing your stomach muscles to relax.  Muscle relaxation. This involves intentionally tensing muscles then relaxing them. Choose a stress reduction technique that fits your lifestyle and personality. Stress reduction techniques take time and practice to develop. Set aside 5-15 minutes a day to do them. Therapists can offer training in these techniques. The training may be covered by some insurance plans. Other things you can do to manage stress include:  Keeping a stress diary. This can help you learn what triggers your stress and ways to control your response.  Understanding what your limits are and saying no to requests or events that lead to a schedule that is too full.  Thinking about how you respond to certain situations. You may not be able to control everything, but you can control how you react.  Adding humor to your life by watching  funny films or TV shows.  Making time for activities that help you relax and not feeling guilty about spending your time this way.  Medicines Your health care provider may suggest certain medicines if he or she feels that they will help improve your condition. Avoid using alcohol and other substances that may prevent your medicines from working properly (may interact). It is also important to:  Talk with your pharmacist or health care provider about all the medicines that you take, their possible side effects, and what medicines are safe to take together.  Make it your goal to take part in all treatment decisions (shared decision-making). This includes giving input on the side effects of medicines. It is best if shared decision-making with your health care provider is part of your total treatment plan. If your health care provider prescribes a medicine, you may not notice the full benefits of it for 4-8 weeks. Most people who are treated for depression need to be on medicine for at  least 6-12 months after they feel better. If you are taking medicines as part of your treatment, do not stop taking medicines without first talking to your health care provider. You may need to have the medicine slowly decreased (tapered) over time to decrease the risk of harmful side effects. Relationships Your health care provider may suggest family therapy along with individual therapy and drug therapy. While there may not be family problems that are causing you to feel depressed, it is still important to make sure your family learns as much as they can about your mental health. Having your family's support can help make your treatment successful. How to recognize changes in your condition Everyone has a different response to treatment for depression. Recovery from major depression happens when you have not had signs of major depression for two months. This may mean that you will start to:  Have more interest in doing activities.  Feel less hopeless than you did 2 months ago.  Have more energy.  Overeat less often, or have better or improving appetite.  Have better concentration. Your health care provider will work with you to decide the next steps in your recovery. It is also important to recognize when your condition is getting worse. Watch for these signs:  Having fatigue or low energy.  Eating too much or too little.  Sleeping too much or too little.  Feeling restless, agitated, or hopeless.  Having trouble concentrating or making decisions.  Having unexplained physical complaints.  Feeling irritable, angry, or aggressive. Get help as soon as you or your family members notice these symptoms coming back. How to get support and help from others How to talk with friends and family members about your condition  Talking to friends and family members about your condition can provide you with one way to get support and guidance. Reach out to trusted friends or family members,  explain your symptoms to them, and let them know that you are working with a health care provider to treat your depression. Financial resources Not all insurance plans cover mental health care, so it is important to check with your insurance carrier. If paying for co-pays or counseling services is a problem, search for a local or county mental health care center. They may be able to offer public mental health care services at low or no cost when you are not able to see a private health care provider. If you are taking medicine for depression, you may be able to get  the generic form, which may be less expensive. Some makers of prescription medicines also offer help to patients who cannot afford the medicines they need. Follow these instructions at home:   Get the right amount and quality of sleep.  Cut down on using caffeine, tobacco, alcohol, and other potentially harmful substances.  Try to exercise, such as walking or lifting small weights.  Take over-the-counter and prescription medicines only as told by your health care provider.  Eat a healthy diet that includes plenty of vegetables, fruits, whole grains, low-fat dairy products, and lean protein. Do not eat a lot of foods that are high in solid fats, added sugars, or salt.  Keep all follow-up visits as told by your health care provider. This is important. Contact a health care provider if:  You stop taking your antidepressant medicines, and you have any of these symptoms: ? Nausea. ? Headache. ? Feeling lightheaded. ? Chills and body aches. ? Not being able to sleep (insomnia).  You or your friends and family think your depression is getting worse. Get help right away if:  You have thoughts of hurting yourself or others. If you ever feel like you may hurt yourself or others, or have thoughts about taking your own life, get help right away. You can go to your nearest emergency department or call:  Your local emergency services  (911 in the [place]  A suicide crisis helpline, such as the [place] at 501 868 1911. This is open 24-hours a day. Summary  If you are living with depression, there are ways to help you recover from it and also ways to prevent it from coming back.  Work with your health care team to create a management plan that includes counseling, stress management techniques, and healthy lifestyle habits. This information is not intended to replace advice given to you by your health care provider. Make sure you discuss any questions you have with your health care provider. Document Revised: 07/23/2018 Document Reviewed: 03/03/2016 Elsevier Patient Education  [place].   Patient verbalizes understanding of instructions provided today.   Telephone follow up appointment with Managed Medicaid LCSW scheduled for: February 09, 2020 @ 9:00am.  Managed Medicaid BSW will contact you to schedule you for outpatient therapy with [facility] at [phone].    Netta Neat, BSW, MSW, LCSW Social Work Case Freight forwarder - [place]  Direct [place]: [phone]

## 2020-01-16 ENCOUNTER — Other Ambulatory Visit: Payer: Self-pay

## 2020-01-16 NOTE — Patient Outreach (Signed)
Care Coordination  01/16/2020  Fahmida Jurich [facility] April 01, 1961 [id number]  An unsuccessful telephone outreach was attempted today. The patient was referred to the case management team for assistance with care management and care coordination.   Follow Up Plan: Social Worker will complete another outreach in 7 days.[name], BSW, [place]  High Risk Managed Medicaid Team

## 2020-01-16 NOTE — Patient Instructions (Signed)
Visit Information  Ms. Coralee [place]  - as a part of your Medicaid benefit, you are eligible for care management and care coordination services at no cost or copay. I was unable to reach you by phone today but would be happy to help you with your health related needs. Please feel free to call me at [phone].   A member of the Managed Medicaid care management team will reach out to you again over the next 7 days.   [name], BSW, [place]  High Risk Managed Medicaid Team

## 2020-01-19 ENCOUNTER — Other Ambulatory Visit: Payer: Self-pay | Admitting: *Deleted

## 2020-01-19 ENCOUNTER — Other Ambulatory Visit: Payer: Self-pay

## 2020-01-19 NOTE — Patient Instructions (Signed)
Visit Information  Margaret Aguilar was given information about Medicaid Managed Care team care coordination services as a part of their Healthy [facility] Medicaid benefit. Margaret Aguilar verbally consented to engagement with the [facility] Managed Care team.   For questions related to your Healthy [facility] health plan, please call: [phone] or visit the homepage here: [email or web address]  If you would like to schedule transportation through your Healthy [facility] plan, please call the following number at least 2 days in advance of your appointment: [phone]  Goals Addressed              This Visit's Progress   .  "I need to check my blood pressure more often" (pt-stated)        CARE PLAN ENTRY Medicaid Managed Care (see longtitudinal plan of care for additional care plan information)  Objective:  . Last practice recorded BP readings:  BP Readings from Last 3 Encounters:  12/22/19 (!) 142/76  12/14/19 (!) 148/90  11/23/19 110/67    Current Barriers:  [name] Knowledge Deficits related to basic understanding of hypertension pathophysiology and self care management. Patient reports checking her BP occasionally at home. Last reading was 145/90.  Case Manager Clinical Goal(s):  [name] Over the next 60 days, patient will verbalize understanding of plan for hypertension management . Over the next 60 days, patient will attend all scheduled medical appointments: PCP 02/20/20 . Over the next 30 days, patient will demonstrate improved adherence to prescribed treatment plan for hypertension as evidenced by taking all medications as prescribed, monitoring and recording blood pressure as directed, adhering to low sodium/DASH diet. As seen in the notes, [name], NP would like BP readings <130/80 . Over the next 30 days, patient will continue to increase her activity/exercise  Interventions:  . Evaluation of current treatment plan related to hypertension  self management and patient's adherence to plan as established by provider. . Reviewed medications with patient and discussed importance of compliance . Discussed plans with patient for ongoing care management follow up and provided patient with direct contact information for care management team . Advised patient, providing education and rationale, to monitor blood pressure daily and record, calling PCP for findings outside established parameters. RNCM instructed patient to check BP one hour after taking prescribed BP medications, record and take readings to next PCP visit . Reviewed scheduled/upcoming provider appointments including: PCP 02/20/20  Patient Self Care Activities:  . Self administers medications as prescribed . Attends all scheduled provider appointments . Calls provider office for new concerns, questions, or BP outside discussed parameters . Checks BP and records as discussed . Follows a low sodium diet/DASH diet . Patient reports walking 2 miles every other day-Pt will increase walking as tolerated  Initial goal documentation      .  "I want to talk to someone about my breathing and sleeping" (pt-stated)        [place] Medicaid Managed Care (see longitudinal plan of care for additional care plan information)  Current Barriers:  [name] Knowledge Deficits related to appropriate evaluations for sleep study, respiratory support; patient reports her husband observes periods of "not breathing" during the night; patient had sleep study in 2015 but was unable to afford CPAP at the time; wishes to be re-evaluated; also asks about getting a Ventolin inhaler as she is concerned about breathing problems since the advent of COVID  Referral for sleep study placed by [name], patient waiting for scheduling.  Nurse Case Manager Clinical Goal(s):  .  Over the next 14 days, patient will verbalize understanding of plan for referral placed by PCP for sleep study  Interventions:   . Inter-disciplinary care team collaboration (see longitudinal plan of care) . Discussed plans with patient for ongoing care management follow up and provided patient with direct contact information for care management team . Reviewed scheduled/upcoming provider appointments including: Well Woman visit with PCP appointment on 11/8 . Follow up on sleep study referral, RNCM will call the Sleep [place] at [facility] [phone] to inquire about scheduling appt  Patient Self Care Activities:  . Patient will attend sleep study once scheduled . Patient verbalizes ways to care for herself by engaging in light exercise and self care activities . Patient will use albuterol inhaler as needed, as prescribed  Please see past updates related to this goal by clicking on the "Past Updates" button in the selected goal          Please see education materials related to Hypertension provided by MyChart link.   Hypertension, Adult Hypertension is another name for high blood pressure. High blood pressure forces your heart to work harder to pump blood. This can cause problems over time. There are two numbers in a blood pressure reading. There is a top number (systolic) over a bottom number (diastolic). It is best to have a blood pressure that is below 120/80. Healthy choices can help lower your blood pressure, or you may need medicine to help lower it. What are the causes? The cause of this condition is not known. Some conditions may be related to high blood pressure. What increases the risk?  Smoking.  Having type 2 diabetes mellitus, high cholesterol, or both.  Not getting enough exercise or physical activity.  Being overweight.  Having too much fat, sugar, calories, or salt (sodium) in your diet.  Drinking too much alcohol.  Having long-term (chronic) kidney disease.  Having a family history of high blood pressure.  Age. Risk increases with age.  Race. You may be at higher risk  if you are African American.  Gender. Men are at higher risk than women before age 37. After age 26, women are at higher risk than men.  Having obstructive sleep apnea.  Stress. What are the signs or symptoms?  High blood pressure may not cause symptoms. Very high blood pressure (hypertensive crisis) may cause: ? Headache. ? Feelings of worry or nervousness (anxiety). ? Shortness of breath. ? Nosebleed. ? A feeling of being sick to your stomach (nausea). ? Throwing up (vomiting). ? Changes in how you see. ? Very bad chest pain. ? Seizures. How is this treated?  This condition is treated by making healthy lifestyle changes, such as: ? Eating healthy foods. ? Exercising more. ? Drinking less alcohol.  Your health care provider may prescribe medicine if lifestyle changes are not enough to get your blood pressure under control, and if: ? Your top number is above 130. ? Your bottom number is above 80.  Your personal target blood pressure may vary. Follow these instructions at home: Eating and drinking   If told, follow the DASH eating plan. To follow this plan: ? Fill one half of your plate at each meal with fruits and vegetables. ? Fill one fourth of your plate at each meal with whole grains. Whole grains include whole-wheat pasta, brown rice, and whole-grain bread. ? Eat or drink low-fat dairy products, such as skim milk or low-fat yogurt. ? Fill one fourth of your plate at each meal  with low-fat (lean) proteins. Low-fat proteins include fish, chicken without skin, eggs, beans, and tofu. ? Avoid fatty meat, cured and processed meat, or chicken with skin. ? Avoid pre-made or processed food.  Eat less than 1,500 mg of salt each day.  Do not drink alcohol if: ? Your doctor tells you not to drink. ? You are pregnant, may be pregnant, or are planning to become pregnant.  If you drink alcohol: ? Limit how much you use to:  0-1 drink a day for women.  0-2 drinks a day for  men. ? Be aware of how much alcohol is in your drink. In the [place] one drink equals one 12 oz bottle of beer (355 mL), one 5 oz glass of wine (148 mL), or one 1 oz glass of hard liquor (44 mL). Lifestyle   Work with your doctor to stay at a healthy weight or to lose weight. Ask your doctor what the best weight is for you.  Get at least 30 minutes of exercise most days of the week. This may include walking, swimming, or biking.  Get at least 30 minutes of exercise that strengthens your muscles (resistance exercise) at least 3 days a week. This may include lifting weights or doing Pilates.  Do not use any products that contain nicotine or tobacco, such as cigarettes, e-cigarettes, and chewing tobacco. If you need help quitting, ask your doctor.  Check your blood pressure at home as told by your doctor.  Keep all follow-up visits as told by your doctor. This is important. Medicines  Take over-the-counter and prescription medicines only as told by your doctor. Follow directions carefully.  Do not skip doses of blood pressure medicine. The medicine does not work as well if you skip doses. Skipping doses also puts you at risk for problems.  Ask your doctor about side effects or reactions to medicines that you should watch for. Contact a doctor if you:  Think you are having a reaction to the medicine you are taking.  Have headaches that keep coming back (recurring).  Feel dizzy.  Have swelling in your ankles.  Have trouble with your vision. Get help right away if you:  Get a very bad headache.  Start to feel mixed up (confused).  Feel weak or numb.  Feel faint.  Have very bad pain in your: ? Chest. ? Belly (abdomen).  Throw up more than once.  Have trouble breathing. Summary  Hypertension is another name for high blood pressure.  High blood pressure forces your heart to work harder to pump blood.  For most people, a normal blood pressure is less than  120/80.  Making healthy choices can help lower blood pressure. If your blood pressure does not get lower with healthy choices, you may need to take medicine. This information is not intended to replace advice given to you by your health care provider. Make sure you discuss any questions you have with your health care provider. Document Revised: 12/09/2017 Document Reviewed: 12/09/2017 Elsevier Patient Education  2020 [employer].   Patient verbalizes understanding of instructions provided today.   The patient has been provided with contact information for the Managed Medicaid care management team and has been advised to call with any health related questions or concerns.  Telephone follow up appointment with Managed Medicaid care management team member scheduled for:02/21/20 @ [place] RN, [place] RN Care Coordinator

## 2020-01-19 NOTE — Patient Outreach (Signed)
Care Coordination - Case Manager  01/19/2020  Crystina Borrayo [facility] 17-Jul-1960 [id number]  Subjective:  Margaret Aguilar is an 59 y.o. year old female who is a primary patient of Margaret Francois, NP.  Ms. Staszewski was given information about Medicaid Managed Care team care coordination services today. Margaret Aguilar agreed to services and verbal consent obtained  Review of patient status, laboratory and other test data was performed as part of evaluation for provision of services.  SDOH: SDOH Screenings   Alcohol Screen: Low Risk   . Last Alcohol Screening Score (AUDIT): 0  Depression (PHQ2-9): Medium Risk  . PHQ-2 Score: 15  Financial Resource Strain: Medium Risk  . Difficulty of Paying Living Expenses: Somewhat hard  Food Insecurity: No Food Insecurity  . Worried About Charity fundraiser in the Last Year: Never true  . Ran Out of Food in the Last Year: Never true  Housing: Low Risk   . Last Housing Risk Score: 0  Physical Activity: Sufficiently Active  . Days of Exercise per Week: 4 days  . Minutes of Exercise per Session: 60 min  Social Connections: Moderately Integrated  . Frequency of Communication with Friends and Family: Three times a week  . Frequency of Social Gatherings with Friends and Family: Three times a week  . Attends Religious Services: More than 4 times per year  . Active Member of Clubs or Organizations: No  . Attends Archivist Meetings: Never  . Marital Status: Married  Stress: Stress Concern Present  . Feeling of Stress : Rather much  Tobacco Use: Low Risk   . Smoking Tobacco Use: Never Smoker  . Smokeless Tobacco Use: Never Used  Transportation Needs: No Transportation Needs  . Lack of Transportation (Medical): No  . Lack of Transportation (Non-Medical): No     Objective:    Allergies  Allergen Reactions  . Nutritional Supplements     Walnut >> UNSPECIFIED REACTION  Severity from [place]  . Latex Swelling and Rash     SWELLING REACTION UNSPECIFIED     Medications:    Medications Reviewed Today    Reviewed by Melissa Montane, RN (Registered Nurse) on 01/19/20 at 48  Med List Status: <None>  Medication Order Taking? Sig Documenting Provider Last Dose Status Informant  albuterol (PROVENTIL) (2.5 MG/3ML) 0.083% nebulizer solution [id number] Yes Take 3 mLs (2.5 mg total) by nebulization every 6 (six) hours as needed for wheezing or shortness of breath. Margaret Francois, NP Taking Active   albuterol (VENTOLIN HFA) 108 (90 Base) MCG/ACT inhaler [id number] Yes Inhale 2 puffs into the lungs every 6 (six) hours as needed for wheezing or shortness of breath. Margaret Foy, NP Taking Active   allopurinol (ZYLOPRIM) 100 MG tablet [id number] Yes Take 100 mg by mouth daily. [provider] Taking Active Self  amLODipine (NORVASC) 10 MG tablet [id number] Yes Take 10 mg by mouth daily. [provider] Taking Active   isosorbide-hydrALAZINE (BIDIL) 20-37.5 MG tablet [id number] Yes Take by mouth 2 (two) times daily. [provider] Taking Active   metoprolol succinate (TOPROL-XL) 100 MG 24 hr tablet [id number] Yes Take 50 mg by mouth daily. Take with or immediately following a meal. [provider] Taking Active Self  montelukast (SINGULAIR) 10 MG tablet [id number] Yes Take 1 tablet (10 mg total) by mouth at bedtime. Margaret Francois, NP Taking Active           Assessment:   Goals Addressed  This Visit's Progress   .  "I need to check my blood pressure more often" (pt-stated)        CARE PLAN ENTRY Medicaid Managed Care (see longtitudinal plan of care for additional care plan information)  Objective:  . Last practice recorded BP readings:  BP Readings from Last 3 Encounters:  12/22/19 (!) 142/76  12/14/19 (!) 148/90  11/23/19 110/67    Current Barriers:  Margaret Aguilar Knowledge Deficits related to basic understanding of hypertension pathophysiology and self care management.  Patient reports checking her BP occasionally at home. Last reading was 145/90.  Case Manager Clinical Goal(s):  Margaret Aguilar Over the next 60 days, patient will verbalize understanding of plan for hypertension management . Over the next 60 days, patient will attend all scheduled medical appointments: PCP 02/20/20 . Over the next 30 days, patient will demonstrate improved adherence to prescribed treatment plan for hypertension as evidenced by taking all medications as prescribed, monitoring and recording blood pressure as directed, adhering to low sodium/DASH diet. As seen in the notes, Margaret David, NP would like BP readings <130/80 . Over the next 30 days, patient will continue to increase her activity/exercise  Interventions:  . Evaluation of current treatment plan related to hypertension self management and patient's adherence to plan as established by provider. . Reviewed medications with patient and discussed importance of compliance . Discussed plans with patient for ongoing care management follow up and provided patient with direct contact information for care management team . Advised patient, providing education and rationale, to monitor blood pressure daily and record, calling PCP for findings outside established parameters. RNCM instructed patient to check BP one hour after taking prescribed BP medications, record and take readings to next PCP visit . Reviewed scheduled/upcoming provider appointments including: PCP 02/20/20  Patient Self Care Activities:  . Self administers medications as prescribed . Attends all scheduled provider appointments . Calls provider office for new concerns, questions, or BP outside discussed parameters . Checks BP and records as discussed . Follows a low sodium diet/DASH diet . Patient reports walking 2 miles every other day-Pt will increase walking as tolerated  Initial goal documentation      .  "I want to talk to someone about my breathing and sleeping"  (pt-stated)        [place] Medicaid Managed Care (see longitudinal plan of care for additional care plan information)  Current Barriers:  Margaret Aguilar Knowledge Deficits related to appropriate evaluations for sleep study, respiratory support; patient reports her husband observes periods of "not breathing" during the night; patient had sleep study in 2015 but was unable to afford CPAP at the time; wishes to be re-evaluated; also asks about getting a Ventolin inhaler as she is concerned about breathing problems since the advent of COVID  Referral for sleep study placed by Margaret Aguilar, patient waiting for scheduling.  Nurse Case Manager Clinical Goal(s):  Margaret Aguilar Over the next 14 days, patient will verbalize understanding of plan for referral placed by PCP for sleep study  Interventions:  . Inter-disciplinary care team collaboration (see longitudinal plan of care) . Discussed plans with patient for ongoing care management follow up and provided patient with direct contact information for care management team . Reviewed scheduled/upcoming provider appointments including: Well Woman visit with PCP appointment on 11/8 . Follow up on sleep study referral, RNCM will call the Sleep [place] at [facility] [phone] to inquire about scheduling appt  Patient Self Care Activities:  . Patient will attend sleep study  once scheduled . Patient verbalizes ways to care for herself by engaging in light exercise and self care activities . Patient will use albuterol inhaler as needed, as prescribed  Please see past updates related to this goal by clicking on the "Past Updates" button in the selected goal          Plan: Follow up telephone call with [facility] 02/21/20 @ [place], [place] RN Care Coordinator

## 2020-01-25 ENCOUNTER — Other Ambulatory Visit: Payer: Self-pay

## 2020-01-25 NOTE — Patient Outreach (Signed)
Care Coordination- Social Work  01/25/2020  Margaret Aguilar [facility] 1960/05/31 [id number]  Subjective:    Margaret Aguilar is an 59 y.o. year old female who is a primary patient of Margaret Francois, NP.    [name] was given information about Medicaid Managed Care team care coordination services today. Margaret Aguilar agreed to services and verbal consent obtained  Review of patient status, laboratory and other test data was performed as part of evaluation for provision of services.  SDOH:   SDOH Screenings   Alcohol Screen: Low Risk   . Last Alcohol Screening Score (AUDIT): 0  Depression (PHQ2-9): Medium Risk  . PHQ-2 Score: 15  Financial Resource Strain: Medium Risk  . Difficulty of Paying Living Expenses: Somewhat hard  Food Insecurity: No Food Insecurity  . Worried About Charity fundraiser in the Last Year: Never true  . Ran Out of Food in the Last Year: Never true  Housing: Low Risk   . Last Housing Risk Score: 0  Physical Activity: Sufficiently Active  . Days of Exercise per Week: 4 days  . Minutes of Exercise per Session: 60 min  Social Connections: Moderately Integrated  . Frequency of Communication with Friends and Family: Three times a week  . Frequency of Social Gatherings with Friends and Family: Three times a week  . Attends Religious Services: More than 4 times per year  . Active Member of Clubs or Organizations: No  . Attends Archivist Meetings: Never  . Marital Status: Married  Stress: Stress Concern Present  . Feeling of Stress : Rather much  Tobacco Use: Low Risk   . Smoking Tobacco Use: Never Smoker  . Smokeless Tobacco Use: Never Used  Transportation Needs: No Transportation Needs  . Lack of Transportation (Medical): No  . Lack of Transportation (Non-Medical): No     Objective:    Medications:  Medications Reviewed Today    Reviewed by Margaret Montane, RN (Registered Nurse) on 01/19/20 at 28  Med List Status: <None>    Medication Order Taking? Sig Documenting Provider Last Dose Status Informant  albuterol (PROVENTIL) (2.5 MG/3ML) 0.083% nebulizer solution [id number] Yes Take 3 mLs (2.5 mg total) by nebulization every 6 (six) hours as needed for wheezing or shortness of breath. Margaret Francois, NP Taking Active   albuterol (VENTOLIN HFA) 108 (90 Base) MCG/ACT inhaler [id number] Yes Inhale 2 puffs into the lungs every 6 (six) hours as needed for wheezing or shortness of breath. [name], NP Taking Active   allopurinol (ZYLOPRIM) 100 MG tablet [id number] Yes Take 100 mg by mouth daily. [provider] Taking Active Self  amLODipine (NORVASC) 10 MG tablet [id number] Yes Take 10 mg by mouth daily. [provider] Taking Active   isosorbide-hydrALAZINE (BIDIL) 20-37.5 MG tablet [id number] Yes Take by mouth 2 (two) times daily. [provider] Taking Active   metoprolol succinate (TOPROL-XL) 100 MG 24 hr tablet [id number] Yes Take 50 mg by mouth daily. Take with or immediately following a meal. [provider] Taking Active Self  montelukast (SINGULAIR) 10 MG tablet [id number] Yes Take 1 tablet (10 mg total) by mouth at bedtime. Margaret Francois, NP Taking Active           Fall/Depression Screening:  Fall Risk  12/22/2019 12/14/2019 01/12/2018  Falls in the past year? 1 1 No  Number falls in past yr: 0 0 -  Injury with Fall? 0 0 -   PHQ 2/9 Scores 01/12/2020 12/22/2019  11/23/2019 01/12/2018  PHQ - 2 Score 2 0 5 2  PHQ- 9 Score 15 - 21 10  Exception Documentation - Medical reason - -    Assessment:  Goals Addressed              This Visit's Progress   .  "I would like to talk to someone about counseling" (pt-stated)        [place] (see longitudinal plan of care for additional care plan information)  Current Barriers:  . Care Coordination needs related to counseling resources available in a patient with stated emotional health needs  Nurse  Case Manager Clinical Goal(s):  [name] Over the next 14 days, patient will verbalize understanding of plan for counseling support . Over the next 7 days, patient will work with CM clinical social worker to address counseling support needs  Interventions:  . Inter-disciplinary care team collaboration (see longitudinal plan of care) . Evaluation of current treatment plan related to emotional health and patient's adherence to plan as established by provider. Patient states that she desires assistance with mental/emotional health and is not currently connected to a therapist.  . Collaborated with LCSW regarding emotional/mental health needs.  LCSW will collaborate with BSW to schedule patient with an outpatient therapy provider. Referral sent to BSW to schedule with [facility] - [phone]. BSW scheduled an appointment for [place] for 02/22/20. The appointment will be virtual.  . Discussed plans with patient for ongoing care management follow up and provided patient with direct contact information for care management team  Patient Self Care Activities:  . Patient verbalizes understanding of plan to work with LCSW for mental/emotional health support .  Patient stated to LCSW that she is still very interested in participating in outpatient therapy and is waiting to be scheduled with a provider. . Unable to independently procure long term counseling/therapy support  -  Patient stated to LCSW that she is waiting to be scheduled with an outpatient therapist.  . Patient will contact her pharmacy for refills on Metoprolol Succinate and Allopurinol.  Please see past updates related to this goal by clicking on the "Past Updates" button in the selected goal    12/05/2019 Update: Patient verbalized need for outpatient therapy. She discussed her situation at home (she is caring for her nephew's three young children: 18 yo and 25 yo twins, and she has been caring for them for the  past 2 years due to CPS involvement). 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  She discussed what she feels led up to her current health issues (her sister passed away 5 years ago when she was 59 years old. Patient stated she didn't really deal with her death but she also started gaining weight.) Patient discussed other life events that she feels also contributed to her current feelings of depression. She acknowledged that she needs help to work through her issues. LCSW explained that a referral will be made for her to begin therapy at a local agency. Patient verbalized understanding and was agreeable.  12/12/19: RN CM collaborated with LCSW regarding Outpatient therapy. Referral made on 12/05/19.       Plan: Patient will contact her pharmacy for refills on medications. BSW will contact patient in 30 days.

## 2020-01-25 NOTE — Patient Instructions (Signed)
Visit Information  Margaret Aguilar was given information about Medicaid Managed Care team care coordination services as a part of their [facility] Medicaid benefit. [name] verbally consented to engagement with the [facility] Managed Care team.    You will receive a new patient packet for your appointment with [facility] on 02/22/20. They are located at [address], [place] [address]. They  can be reached at [phone].  For questions related to your Healthy [facility] health plan, please call: [phone] or visit the homepage here: [email or web address]  If you would like to schedule transportation through your Healthy [facility] plan, please call the following number at least 2 days in advance of your appointment: [phone]     Social Worker will follow up with patient in 30 days.[name], BSW, [place]  High Risk Managed Medicaid Team

## 2020-02-09 ENCOUNTER — Other Ambulatory Visit: Payer: Self-pay

## 2020-02-09 NOTE — Patient Instructions (Signed)
Visit Information  Margaret Aguilar  - as a part of your Medicaid benefit, you are eligible for care management and care coordination services at no cost or copay. I was unable to reach you by phone today but would be happy to help you with your health related needs. Please feel free to call me @ [phone].   A member of the Managed Medicaid care management team will reach out to you again over the next 14 days.   Netta Neat, BSW, MSW, LCSW Social Work Case Freight forwarder - [place]  Direct [place]: [phone]

## 2020-02-09 NOTE — Patient Outreach (Addendum)
Care Coordination  02/09/2020  Peggyann Zwiefelhofer [facility] [birth year] [id number]  An unsuccessful telephone outreach was attempted today. The patient was referred to the case management team for assistance with care management and care coordination.   Follow Up Plan: The Managed Medicaid care management team will reach out to the patient again over the next 14 days.   Netta Neat, BSW, MSW, LCSW Social Work Case Freight forwarder - [place]  Direct [place]: [phone]

## 2020-02-15 DIAGNOSIS — N2581 Secondary hyperparathyroidism of renal origin: Secondary | ICD-10-CM | POA: Diagnosis not present

## 2020-02-15 DIAGNOSIS — E1129 Type 2 diabetes mellitus with other diabetic kidney complication: Secondary | ICD-10-CM | POA: Diagnosis not present

## 2020-02-15 DIAGNOSIS — N189 Chronic kidney disease, unspecified: Secondary | ICD-10-CM | POA: Diagnosis not present

## 2020-02-15 DIAGNOSIS — N185 Chronic kidney disease, stage 5: Secondary | ICD-10-CM | POA: Diagnosis not present

## 2020-02-15 DIAGNOSIS — Z6841 Body Mass Index (BMI) 40.0 and over, adult: Secondary | ICD-10-CM | POA: Diagnosis not present

## 2020-02-15 DIAGNOSIS — E785 Hyperlipidemia, unspecified: Secondary | ICD-10-CM | POA: Diagnosis not present

## 2020-02-15 DIAGNOSIS — R001 Bradycardia, unspecified: Secondary | ICD-10-CM | POA: Diagnosis not present

## 2020-02-15 DIAGNOSIS — T50905A Adverse effect of unspecified drugs, medicaments and biological substances, initial encounter: Secondary | ICD-10-CM | POA: Diagnosis not present

## 2020-02-15 DIAGNOSIS — M1990 Unspecified osteoarthritis, unspecified site: Secondary | ICD-10-CM | POA: Diagnosis not present

## 2020-02-15 DIAGNOSIS — I770 Arteriovenous fistula, acquired: Secondary | ICD-10-CM | POA: Diagnosis not present

## 2020-02-15 DIAGNOSIS — D631 Anemia in chronic kidney disease: Secondary | ICD-10-CM | POA: Diagnosis not present

## 2020-02-15 DIAGNOSIS — I120 Hypertensive chronic kidney disease with stage 5 chronic kidney disease or end stage renal disease: Secondary | ICD-10-CM | POA: Diagnosis not present

## 2020-02-20 ENCOUNTER — Encounter: Payer: Self-pay | Admitting: Nurse Practitioner

## 2020-02-21 ENCOUNTER — Other Ambulatory Visit: Payer: Self-pay | Admitting: *Deleted

## 2020-02-21 NOTE — Patient Instructions (Signed)
Visit Information  Ms. Margaret Aguilar  - as a part of your Medicaid benefit, you are eligible for care management and care coordination services at no cost or copay. I was unable to reach you by phone today but would be happy to help you with your health related needs. Please feel free to call me @ [phone].   A member of the Managed Medicaid care management team will reach out to you again over the next 7-14 days.   Lurena Joiner RN, BSN [place]  [employer] manager

## 2020-02-21 NOTE — Patient Outreach (Signed)
Care Coordination  02/21/2020  Devlin Mcveigh [facility] 1960/08/09 [id number]  An unsuccessful telephone outreach was attempted today. The patient was referred to the case management team for assistance with care management and care coordination.   Follow Up Plan: The Managed Medicaid care management team will reach out to the patient again over the next 7-14 days.   Lurena Joiner RN, BSN [place]  [employer] manager

## 2020-02-22 DIAGNOSIS — F4310 Post-traumatic stress disorder, unspecified: Secondary | ICD-10-CM | POA: Diagnosis not present

## 2020-02-22 DIAGNOSIS — F419 Anxiety disorder, unspecified: Secondary | ICD-10-CM | POA: Diagnosis not present

## 2020-02-28 ENCOUNTER — Other Ambulatory Visit: Payer: Self-pay

## 2020-02-28 ENCOUNTER — Ambulatory Visit: Payer: Self-pay

## 2020-02-28 NOTE — Patient Outreach (Addendum)
Care Coordination- Social Work  02/28/2020  Margaret Aguilar [facility] 08/09/1960 [id number]  Subjective:    Margaret Aguilar is an 59 y.o. year old female who is a primary patient of [name], NP.    Margaret Aguilar was given information about Medicaid Managed Care team care coordination services today. Margaret Aguilar agreed to services and verbal consent obtained  Review of patient status, laboratory and other test data was performed as part of evaluation for provision of services.  SDOH:   SDOH Screenings   Alcohol Screen: Low Risk    Last Alcohol Screening Score (AUDIT): 0  Depression (PHQ2-9): Medium Risk   PHQ-2 Score: 6  Financial Resource Strain: Medium Risk   Difficulty of Paying Living Expenses: Somewhat hard  Food Insecurity: No Food Insecurity   Worried About Charity fundraiser in the Last Year: Never true   Ran Out of Food in the Last Year: Never true  Housing: Low Risk    Last Housing Risk Score: 0  Physical Activity: Sufficiently Active   Days of Exercise per Week: 4 days   Minutes of Exercise per Session: 60 min  Social Connections: Moderately Integrated   Frequency of Communication with Friends and Family: Three times a week   Frequency of Social Gatherings with Friends and Family: Three times a week   Attends Religious Services: More than 4 times per year   Active Member of Clubs or Organizations: No   Attends Archivist Meetings: Never   Marital Status: Married  Stress: No Stress Concern Present   Feeling of Stress : Not at all  Tobacco Use: Low Risk    Smoking Tobacco Use: Never Smoker   Smokeless Tobacco Use: Never Used  Transportation Needs: No Transportation Needs   Lack of Transportation (Medical): No   Lack of Transportation (Non-Medical): No   SDOH Interventions     Most Recent Value  SDOH Interventions  Financial Strain Interventions Other (Comment)  [Patient is interested in completing a new disability  application.]  Stress Interventions Intervention Not Indicated, Provide Counseling  Depression Interventions/Treatment  Counseling  [Patient recently started therapy and has not been referred to psychiatry yet.]      Objective:    Medications:  Medications Reviewed Today    Reviewed by Netta Neat, LCSW (Social Worker) on 02/28/20 at 64  Med List Status: <None>  Medication Order Taking? Sig Documenting Provider Last Dose Status Informant  albuterol (PROVENTIL) (2.5 MG/3ML) 0.083% nebulizer solution [id number] Yes Take 3 mLs (2.5 mg total) by nebulization every 6 (six) hours as needed for wheezing or shortness of breath. [name], NP Taking Active   albuterol (VENTOLIN HFA) 108 (90 Base) MCG/ACT inhaler [id number] Yes Inhale 2 puffs into the lungs every 6 (six) hours as needed for wheezing or shortness of breath. [name], NP Taking Active   allopurinol (ZYLOPRIM) 100 MG tablet [id number] Yes Take 100 mg by mouth daily. [provider] Taking Active Self  amLODipine (NORVASC) 10 MG tablet [id number] Yes Take 10 mg by mouth daily. [provider] Taking Active   isosorbide-hydrALAZINE (BIDIL) 20-37.5 MG tablet [id number] Yes Take by mouth 2 (two) times daily. [provider] Taking Active   metoprolol succinate (TOPROL-XL) 100 MG 24 hr tablet [id number] Yes Take 50 mg by mouth daily. Take with or immediately following a meal. [provider] Taking Active Self  montelukast (SINGULAIR) 10 MG tablet [id number] Yes Take 1 tablet (10 mg total) by mouth at bedtime.  [name], NP Taking Active           Fall/Depression Screening:  Fall Risk  12/22/2019 12/14/2019 01/12/2018  Falls in the past year? 1 1 No  Number falls in past yr: 0 0 -  Injury with Fall? 0 0 -   PHQ 2/9 Scores 02/28/2020 01/12/2020 12/22/2019 11/23/2019 01/12/2018  PHQ - 2 Score 2 2 0 5 2  PHQ- 9 Score 6 15 - 21 10  Exception Documentation - - Medical reason - -     Assessment:  Goals Addressed              This Visit's Progress     COMPLETED: "I would like to talk to someone about counseling" (pt-stated)   On track     Margaret Aguilar (see longitudinal plan of care for additional care plan information)  Current Barriers:   Care Coordination needs related to counseling resources available in a patient with stated emotional health needs  Nurse Case Manager Clinical Goal(s):   Over the next 14 days, patient will verbalize understanding of plan for counseling support  Over the next 7 days, patient will work with CM clinical social worker to address counseling support needs  Interventions:   Inter-disciplinary care team collaboration (see longitudinal plan of care)  Evaluation of current treatment plan related to emotional health and patient's adherence to plan as established by provider. Patient states that she desires assistance with mental/emotional health and is not currently connected to a therapist.   Collaborated with LCSW regarding emotional/mental health needs.  LCSW will collaborate with BSW to schedule patient with an outpatient therapy provider. Referral sent to BSW to schedule with [facility] - [phone]. BSW scheduled an appointment for Margaret Aguilar for 02/22/20. The appointment will be virtual.   Update 02/28/2020:  Patient stated she attended her initial therapy session on  02/22/2020. She stated the session went well and she is looking forward to beginning therapy.  Discussed plans with patient for ongoing care management follow up and provided patient with direct contact information for care management team  Patient Self Care Activities:   Patient verbalizes understanding of plan to work with LCSW for mental/emotional health support .  Patient stated to LCSW that she is still very interested in participating in outpatient therapy and is waiting to be scheduled  with a provider.  Unable to independently procure long term counseling/therapy support  -  Patient stated to LCSW that she is waiting to be scheduled with an outpatient therapist.   Patient will contact her pharmacy for refills on Metoprolol Succinate and Allopurinol.  Please see past updates related to this goal by clicking on the "Past Updates" button in the selected goal    12/05/2019 Update: Patient verbalized need for outpatient therapy. She discussed her situation at home (she is caring for her nephew's three young children: 72 yo and 78 yo twins, and she has been caring for them for the past 2 years due to CPS involvement). 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  She discussed what she feels led up to her current health issues (her sister passed away 5 years ago when she was 59 years old. Patient stated she didn't really deal with her death but she also started gaining weight.) Patient discussed other life events that she feels also contributed to her current feelings of  depression. She acknowledged that she needs help to work through her issues. LCSW explained that a referral will be made for her to begin therapy at a local agency. Patient verbalized understanding and was agreeable.  12/12/19: RN CM collaborated with LCSW regarding Outpatient therapy. Referral made on 12/05/19.      "Should I reapply for disability" (pt-stated)        [place] (see longitudinal plan of care for additional care plan information)  Current Barriers:   Knowledge Deficits related to applying for Disability benefits  Financial Constraints.   Nurse Case Manager Clinical Goal(s):   Over the next 30 days, patient will verbalize understanding of plan for reapplying for disabilty benefits  Over the next 30 days, patient will work with [place] Team to address needs related to  financial assistance  Interventions:   Inter-disciplinary care team collaboration (see longitudinal plan of care)  Advised patient to reapply for disability benefits  Collaborated with [facility] Care Team regarding disability process  Update 02/28/2020:  Patient stated she is still interested in applying for disability and requests assistance. LCSW will refer to Care Guide Team for assistance.  Patient Self Care Activities:   Patient will apply for disability benefits. LCSW note: Patient stated that when she applied 2 years ago, she was approved for back payments but was not approved for ongoing payments because of her husband's income. She stated she will discuss reapplying with her PCP for advisement.   Please see past updates related to this goal by clicking on the "Past Updates" button in the selected goal           Plan: A member of the Managed Medicaid Care Team will follow-up in 30 days.  LCSW will follow-up in 60 days.    Netta Neat, BSW, MSW, LCSW Social Work Case Freight forwarder - [place]  Direct [place]: [phone]

## 2020-02-28 NOTE — Patient Instructions (Signed)
Visit Information  Margaret Aguilar was given information about Medicaid Managed Care team care coordination services as a part of their Healthy [facility] Medicaid benefit. Margaret Aguilar verbally consented to engagement with the [facility] Managed Care team.   For questions related to your Healthy [facility] health plan, please call: [phone] or visit the homepage here: [email or web address]  If you would like to schedule transportation through your Healthy [facility] plan, please call the following number at least 2 days in advance of your appointment: [phone]  Goals Addressed              This Visit's Progress   .  COMPLETED: "I would like to talk to someone about counseling" (pt-stated)   On track     [place] (see longitudinal plan of care for additional care plan information)  Current Barriers:  . Care Coordination needs related to counseling resources available in a patient with stated emotional health needs  Nurse Case Manager Clinical Goal(s):  Margaret Aguilar Kitchen Over the next 14 days, patient will verbalize understanding of plan for counseling support . Over the next 7 days, patient will work with CM clinical social worker to address counseling support needs  Interventions:  . Inter-disciplinary care team collaboration (see longitudinal plan of care) . Evaluation of current treatment plan related to emotional health and patient's adherence to plan as established by provider. Patient states that she desires assistance with mental/emotional health and is not currently connected to a therapist.  . Collaborated with LCSW regarding emotional/mental health needs.  LCSW will collaborate with BSW to schedule patient with an outpatient therapy provider. Referral sent to BSW to schedule with [facility] - [phone]. BSW scheduled an appointment for [place] for 02/22/20. The appointment will be  virtual.  . Update 02/28/2020:  Patient stated she attended her initial therapy session on  02/22/2020. She stated the session went well and she is looking forward to beginning therapy. . Discussed plans with patient for ongoing care management follow up and provided patient with direct contact information for care management team  Patient Self Care Activities:  . Patient verbalizes understanding of plan to work with LCSW for mental/emotional health support .  Patient stated to LCSW that she is still very interested in participating in outpatient therapy and is waiting to be scheduled with a provider. . Unable to independently procure long term counseling/therapy support  -  Patient stated to LCSW that she is waiting to be scheduled with an outpatient therapist.  . Patient will contact her pharmacy for refills on Metoprolol Succinate and Allopurinol.  Please see past updates related to this goal by clicking on the "Past Updates" button in the selected goal    12/05/2019 Update: Patient verbalized need for outpatient therapy. She discussed her situation at home (she is caring for her nephew's three young children: 57 yo and 51 yo twins, and she has been caring for them for the past 2 years due to CPS involvement). 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  She discussed what she feels led up to her current health issues (her sister passed away 5 years ago when she was 59 years old. Patient stated she didn't really deal with her death but she also started gaining weight.) Patient discussed other life events that she feels also contributed to her current feelings of depression. She acknowledged that she  needs help to work through her issues. LCSW explained that a referral will be made for her to begin therapy at a local agency. Patient verbalized understanding and was agreeable.  12/12/19: RN CM collaborated  with LCSW regarding Outpatient therapy. Referral made on 12/05/19.    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   Nurse Case Manager Clinical Goal(s):  Margaret Aguilar Kitchen Over the next 30 days, patient will verbalize understanding of plan for reapplying for disabilty benefits . Over the next 30 days, patient will work with [place] Team to address needs related to financial assistance  Interventions:  . Inter-disciplinary care team collaboration (see longitudinal plan of care) . Advised patient to reapply for disability benefits . Collaborated with [place] Care Team regarding disability process . Update 02/28/2020:  Patient stated she is still interested in applying for disability and requests assistance. LCSW will refer to Care Guide Team for assistance.  Patient Self Care Activities:  . Patient will apply for disability benefits. LCSW note: Patient stated that when she applied 2 years ago, she was approved for back payments but was not approved for ongoing payments because of her husband's income. She stated she will discuss reapplying with her PCP for advisement.   Please see past updates related to this goal by clicking on the "Past Updates" button in the selected goal           Patient verbalizes understanding of instructions provided today.   Licensed Clinical Social Worker will follow up in May 01, 2020 @ 10:00am. The Managed Medicaid care management team will reach out to the patient again over the next 30 days.   Netta Neat, BSW, MSW, LCSW Social Work Case Freight forwarder - [place]  Direct [place]: [phone]

## 2020-02-29 ENCOUNTER — Other Ambulatory Visit: Payer: Self-pay

## 2020-02-29 NOTE — Patient Outreach (Signed)
Care Coordination- Social Work  02/29/2020  Margaret Aguilar [birth year] [id number]  Subjective:    Margaret Aguilar is an 59 y.o. year old female who is a primary patient of [name], NP.    Margaret Aguilar was given information about Medicaid Managed Care team care coordination services today. Margaret Aguilar agreed to services and verbal consent obtained  Review of patient status, laboratory and other test data was performed as part of evaluation for provision of services.  SDOH:   SDOH Screenings   Alcohol Screen: Low Risk    Last Alcohol Screening Score (AUDIT): 0  Depression (PHQ2-9): Medium Risk   PHQ-2 Score: 6  Financial Resource Strain: Medium Risk   Difficulty of Paying Living Expenses: Somewhat hard  Food Insecurity: No Food Insecurity   Worried About Charity fundraiser in the Last Year: Never true   Ran Out of Food in the Last Year: Never true  Housing: Low Risk    Last Housing Risk Score: 0  Physical Activity: Sufficiently Active   Days of Exercise per Week: 4 days   Minutes of Exercise per Session: 60 min  Social Connections: Moderately Integrated   Frequency of Communication with Friends and Family: Three times a week   Frequency of Social Gatherings with Friends and Family: Three times a week   Attends Religious Services: More than 4 times per year   Active Member of [place] or Organizations: No   Attends Archivist Meetings: Never   Marital Status: Married  Stress: No Stress Concern Present   Feeling of Stress : Not at all  Tobacco Use: Low Risk    Smoking Tobacco Use: Never Smoker   Smokeless Tobacco Use: Never Used  Transportation Needs: No Transportation Needs   Lack of Transportation (Medical): No   Lack of Transportation (Non-Medical): No     Objective:    Medications:  Medications Reviewed Today    Reviewed by Netta Neat, LCSW (Social Worker) on 02/28/20 at 1012  Med List Status: <None>    Medication Order Taking? Sig Documenting Provider Last Dose Status Informant  albuterol (PROVENTIL) (2.5 MG/3ML) 0.083% nebulizer solution [id number] Yes Take 3 mLs (2.5 mg total) by nebulization every 6 (six) hours as needed for wheezing or shortness of breath. [name], NP Taking Active   albuterol (VENTOLIN HFA) 108 (90 Base) MCG/ACT inhaler [id number] Yes Inhale 2 puffs into the lungs every 6 (six) hours as needed for wheezing or shortness of breath. [name], NP Taking Active   allopurinol (ZYLOPRIM) 100 MG tablet [id number] Yes Take 100 mg by mouth daily. [provider] Taking Active Self  amLODipine (NORVASC) 10 MG tablet [id number] Yes Take 10 mg by mouth daily. [provider] Taking Active   isosorbide-hydrALAZINE (BIDIL) 20-37.5 MG tablet [id number] Yes Take by mouth 2 (two) times daily. [provider] Taking Active   metoprolol succinate (TOPROL-XL) 100 MG 24 hr tablet [id number] Yes Take 50 mg by mouth daily. Take with or immediately following a meal. [provider] Taking Active Self  montelukast (SINGULAIR) 10 MG tablet [id number] Yes Take 1 tablet (10 mg total) by mouth at bedtime. [name], NP Taking Active           Fall/Depression Screening:  Fall Risk  12/22/2019 12/14/2019 01/12/2018  Falls in the past year? 1 1 No  Number falls in past yr: 0 0 -  Injury with Fall? 0 0 -   PHQ 2/9 Scores 02/28/2020  01/12/2020 12/22/2019 11/23/2019 01/12/2018  PHQ - 2 Score 2 2 0 5 2  PHQ- 9 Score 6 15 - 21 10  Exception Documentation - - Medical reason - -    Assessment:  Goals Addressed              This Visit's Progress     "Should I reapply for disability" (pt-stated)        [place] (see longitudinal plan of care for additional care plan information)  Current Barriers:   Knowledge Deficits related to applying for Disability benefits  Financial Constraints.   Nurse Case Manager Clinical  Goal(s):   Over the next 30 days, patient will verbalize understanding of plan for reapplying for disabilty benefits  Over the next 30 days, patient will work with [place] Team to address needs related to financial assistance  Interventions:   Inter-disciplinary care team collaboration (see longitudinal plan of care)  Advised patient to reapply for disability benefits  Collaborated with [place] Care Team regarding disability process  Update 02/28/2020:  Patient stated she is still interested in applying for disability and requests assistance. LCSW will refer to Care Guide Team for assistance.  BSW provided patient with the phone number for Legal Aid [phone]. BSW informed patient once she has reapplied, contact Legal Aid.  Patient Self Care Activities:   Patient will apply for disability benefits. LCSW note: Patient stated that when she applied 2 years ago, she was approved for back payments but was not approved for ongoing payments because of her husband's income. She stated she will discuss reapplying with her PCP for advisement.   Please see past updates related to this goal by clicking on the "Past Updates" button in the selected goal           Plan: Patient will contact Legal Aid.

## 2020-02-29 NOTE — Patient Instructions (Signed)
Visit Information  Margaret Aguilar was given information about Medicaid Managed Care team care coordination services as a part of their Healthy [facility] Medicaid benefit. [name] verbally consented to engagement with the [facility] Managed Care team.   For questions related to your Healthy [facility] health plan, please call: [phone] or visit the homepage here: [email or web address]  If you would like to schedule transportation through your Healthy [facility] plan, please call the following number at least 2 days in advance of your appointment: [phone]  Goals Addressed              This Visit's Progress   .  "Should I reapply for disability" (pt-stated)        [place] (see longitudinal plan of care for additional care plan information)  Current Barriers:  [name] Knowledge Deficits related to applying for Disability benefits . 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   Nurse Case Manager Clinical Goal(s):  [name] Over the next 30 days, patient will verbalize understanding of plan for reapplying for disabilty benefits . Over the next 30 days, patient will work with [place] Team to address needs related to financial assistance  Interventions:  . Inter-disciplinary care team collaboration (see longitudinal plan of care) . Advised patient to reapply for disability benefits . Collaborated with [place] Care Team regarding disability process . Update 02/28/2020:  Patient stated she is still interested in applying for disability and requests assistance. LCSW will refer to Care Guide Team for assistance. . BSW provided patient with the phone number for Legal Aid [phone]. BSW informed patient once she has reapplied, contact Legal Aid.  Patient Self Care Activities:  . Patient will apply for disability benefits. LCSW note: Patient stated that when she applied 2 years ago, she was approved for back payments but was not approved for ongoing  payments because of her husband's income. She stated she will discuss reapplying with her PCP for advisement.   Please see past updates related to this goal by clicking on the "Past Updates" button in the selected goal           Social Worker will follow up in 30 days.[name], BSW, [place]  High Risk Managed Medicaid Team

## 2020-03-01 ENCOUNTER — Other Ambulatory Visit: Payer: Self-pay | Admitting: *Deleted

## 2020-03-01 ENCOUNTER — Other Ambulatory Visit: Payer: Self-pay

## 2020-03-01 NOTE — Patient Outreach (Signed)
Care Coordination - Case Manager  03/01/2020  Marit Goodwill [facility] 1960-11-19 [id number]  Subjective:  Margaret Aguilar is an 59 y.o. year old female who is a primary patient of [name], NP.  Ms. Derderian was given information about Medicaid Managed Care team care coordination services today. Margaret Aguilar agreed to services and verbal consent obtained  Review of patient status, laboratory and other test data was performed as part of evaluation for provision of services.  SDOH: SDOH Screenings   Alcohol Screen: Low Risk    Last Alcohol Screening Score (AUDIT): 0  Depression (PHQ2-9): Medium Risk   PHQ-2 Score: 6  Financial Resource Strain: Medium Risk   Difficulty of Paying Living Expenses: Somewhat hard  Food Insecurity: No Food Insecurity   Worried About Charity fundraiser in the Last Year: Never true   Ran Out of Food in the Last Year: Never true  Housing: Low Risk    Last Housing Risk Score: 0  Physical Activity: Sufficiently Active   Days of Exercise per Week: 4 days   Minutes of Exercise per Session: 60 min  Social Connections: Moderately Integrated   Frequency of Communication with Friends and Family: Three times a week   Frequency of Social Gatherings with Friends and Family: Three times a week   Attends Religious Services: More than 4 times per year   Active Member of Clubs or Organizations: No   Attends Archivist Meetings: Never   Marital Status: Married  Stress: No Stress Concern Present   Feeling of Stress : Not at all  Tobacco Use: Low Risk    Smoking Tobacco Use: Never Smoker   Smokeless Tobacco Use: Never Used  Transportation Needs: No Transportation Needs   Lack of Transportation (Medical): No   Lack of Transportation (Non-Medical): No     Objective:    Allergies  Allergen Reactions   Nutritional Supplements     Walnut >> UNSPECIFIED REACTION  Severity from [place]   Latex Swelling and Rash     SWELLING REACTION UNSPECIFIED     Medications:    Medications Reviewed Today    Reviewed by Melissa Montane, RN (Registered Nurse) on 03/01/20 at 1451  Med List Status: <None>  Medication Order Taking? Sig Documenting Provider Last Dose Status Informant  albuterol (PROVENTIL) (2.5 MG/3ML) 0.083% nebulizer solution [id number] Yes Take 3 mLs (2.5 mg total) by nebulization every 6 (six) hours as needed for wheezing or shortness of breath. [name], NP Taking Active   albuterol (VENTOLIN HFA) 108 (90 Base) MCG/ACT inhaler [id number] Yes Inhale 2 puffs into the lungs every 6 (six) hours as needed for wheezing or shortness of breath. [name], NP Taking Active   allopurinol (ZYLOPRIM) 100 MG tablet [id number] Yes Take 100 mg by mouth daily. [provider] Taking Active Self  amLODipine (NORVASC) 10 MG tablet [id number] Yes Take 10 mg by mouth daily. [provider] Taking Active   isosorbide-hydrALAZINE (BIDIL) 20-37.5 MG tablet [id number] Yes Take by mouth 2 (two) times daily. [provider] Taking Active   metoprolol succinate (TOPROL-XL) 100 MG 24 hr tablet [id number] Yes Take 50 mg by mouth daily. Take with or immediately following a meal. [provider] Taking Active Self  montelukast (SINGULAIR) 10 MG tablet [id number] Yes Take 1 tablet (10 mg total) by mouth at bedtime. [name], NP Taking Active           Assessment:   Goals Addressed  This Visit's Progress     "I need to check my blood pressure more often" (pt-stated)        CARE PLAN ENTRY Medicaid Managed Care (see longtitudinal plan of care for additional care plan information)  Objective:   Last practice recorded BP readings:  BP Readings from Last 3 Encounters:  12/22/19 (!) 142/76  12/14/19 (!) 148/90  11/23/19 110/67    Current Barriers:   Knowledge Deficits related to basic understanding of hypertension pathophysiology and self care management.  Patient reports checking her BP occasionally at home. Last reading was 136/90. Patient states that she needs to taker her medication on time.  Case Manager Clinical Goal(s):   Over the next 60 days, patient will verbalize understanding of plan for hypertension management  Over the next 60 days, patient will attend all scheduled medical appointments: PCP 03/07/20  Over the next 30 days, patient will demonstrate improved adherence to prescribed treatment plan for hypertension as evidenced by taking all medications as prescribed, monitoring and recording blood pressure as directed, adhering to low sodium/DASH diet. As seen in the notes, Thad Ranger, NP would like BP readings <130/80  Over the next 30 days, patient will continue to increase her activity/exercise-Patient is joining the [facility]  Interventions:   Evaluation of current treatment plan related to hypertension self management and patient's adherence to plan as established by provider.  Reviewed medications with patient and discussed importance of compliance  Discussed plans with patient for ongoing care management follow up and provided patient with direct contact information for care management team  Advised patient, providing education and rationale, to monitor blood pressure daily and record, calling PCP for findings outside established parameters. RNCM instructed patient to check BP one hour after taking prescribed BP medications, record and take readings to next PCP visit  Reviewed scheduled/upcoming provider appointments including: PCP 03/07/20  Patient Self Care Activities:   Self administers medications as prescribed  Attends all scheduled provider appointments  Calls provider office for new concerns, questions, or BP outside discussed parameters  Checks BP and records as discussed  Follows a low sodium diet/DASH diet  Patient reports walking 2 miles every other day-Pt will increase walking as tolerated  Please see  past updates related to this goal by clicking on the "Past Updates" button in the selected goal         "I want to talk to someone about my breathing and sleeping" (pt-stated)        CARE PLAN ENTRY Medicaid Managed Care (see longitudinal plan of care for additional care plan information)  Current Barriers:   Knowledge Deficits related to appropriate evaluations for sleep study, respiratory support; patient reports her husband observes periods of "not breathing" during the night; patient had sleep study in 2015 but was unable to afford CPAP at the time; wishes to be re-evaluated; also asks about getting a Ventolin inhaler as she is concerned about breathing problems since the advent of COVID  Sleep study consult scheduled for 04/09/20 @ 9am  Nurse Case Manager Clinical Goal(s):   Over the next 14 days, patient will verbalize understanding of plan for referral placed by PCP for sleep study-met  Interventions:   Inter-disciplinary care team collaboration (see longitudinal plan of care)  Discussed plans with patient for ongoing care management follow up and provided patient with direct contact information for care management team  Reviewed scheduled/upcoming provider appointments including: PCP 03/07/20 and Sleep Study Consult 04/09/20  Patient Self Care Activities:   Patient  will attend sleep study 04/09/20  Patient verbalizes ways to care for herself by engaging in light exercise and self care activities  Patient will use albuterol inhaler as needed, as prescribed  Please see past updates related to this goal by clicking on the "Past Updates" button in the selected goal          Plan: RNCM will follow up with a telephone visit on 05/03/20 @ [place], [place] RN Care Coordinator

## 2020-03-01 NOTE — Patient Instructions (Signed)
Visit Information  Ms. Bossard was given information about Medicaid Managed Care team care coordination services as a part of their Healthy [facility] Medicaid benefit. Aneli Zara Mayor verbally consented to engagement with the [facility] Managed Care team.   For questions related to your Healthy [facility] health plan, please call: [phone] or visit the homepage here: [email or web address]  If you would like to schedule transportation through your Healthy [facility] plan, please call the following number at least 2 days in advance of your appointment: [phone]  Goals Addressed              This Visit's Progress   .  "I need to check my blood pressure more often" (pt-stated)        CARE PLAN ENTRY Medicaid Managed Care (see longtitudinal plan of care for additional care plan information)  Objective:  . Last practice recorded BP readings:  BP Readings from Last 3 Encounters:  12/22/19 (!) 142/76  12/14/19 (!) 148/90  11/23/19 110/67    Current Barriers:  [name] Knowledge Deficits related to basic understanding of hypertension pathophysiology and self care management. Patient reports checking her BP occasionally at home. Last reading was 136/90. Patient states that she needs to taker her medication on time.  Case Manager Clinical Goal(s):  [name] Over the next 60 days, patient will verbalize understanding of plan for hypertension management . Over the next 60 days, patient will attend all scheduled medical appointments: PCP 03/07/20 . Over the next 30 days, patient will demonstrate improved adherence to prescribed treatment plan for hypertension as evidenced by taking all medications as prescribed, monitoring and recording blood pressure as directed, adhering to low sodium/DASH diet. As seen in the notes, [name], NP would like BP readings <130/80 . Over the next 30 days, patient will continue to increase her activity/exercise-Patient is joining  the [facility]  Interventions:  . Evaluation of current treatment plan related to hypertension self management and patient's adherence to plan as established by provider. . Reviewed medications with patient and discussed importance of compliance . Discussed plans with patient for ongoing care management follow up and provided patient with direct contact information for care management team . Advised patient, providing education and rationale, to monitor blood pressure daily and record, calling PCP for findings outside established parameters. RNCM instructed patient to check BP one hour after taking prescribed BP medications, record and take readings to next PCP visit . Reviewed scheduled/upcoming provider appointments including: PCP 03/07/20  Patient Self Care Activities:  . Self administers medications as prescribed . Attends all scheduled provider appointments . Calls provider office for new concerns, questions, or BP outside discussed parameters . Checks BP and records as discussed . Follows a low sodium diet/DASH diet . Patient reports walking 2 miles every other day-Pt will increase walking as tolerated  Please see past updates related to this goal by clicking on the "Past Updates" button in the selected goal       .  "I want to talk to someone about my breathing and sleeping" (pt-stated)        [place] (see longitudinal plan of care for additional care plan information)  Current Barriers:  [name] Knowledge Deficits related to appropriate evaluations for sleep study, respiratory support; patient reports her husband observes periods of "not breathing" during the night; patient had sleep study in 2015 but was unable to afford CPAP at the time; wishes to be re-evaluated; also asks about getting a Ventolin inhaler as  she is concerned about breathing problems since the advent of COVID  Sleep study consult scheduled for 04/09/20 @ 9am  Nurse Case Manager Clinical Goal(s):   [name] Over the next 14 days, patient will verbalize understanding of plan for referral placed by PCP for sleep study-met  Interventions:  . Inter-disciplinary care team collaboration (see longitudinal plan of care) . Discussed plans with patient for ongoing care management follow up and provided patient with direct contact information for care management team . Reviewed scheduled/upcoming provider appointments including: PCP 03/07/20 and Sleep Study Consult 04/09/20  Patient Self Care Activities:  . Patient will attend sleep study 04/09/20 . Patient verbalizes ways to care for herself by engaging in light exercise and self care activities . Patient will use albuterol inhaler as needed, as prescribed  Please see past updates related to this goal by clicking on the "Past Updates" button in the selected goal          Patient verbalizes understanding of instructions provided today.   Telephone follow up appointment with Managed Medicaid care management team member scheduled for:05/03/20 @ 1 pm  Lurena Joiner RN, [place] RN Care Coordinator

## 2020-03-07 ENCOUNTER — Encounter: Payer: Self-pay | Admitting: Nurse Practitioner

## 2020-04-02 ENCOUNTER — Other Ambulatory Visit: Payer: Self-pay

## 2020-04-02 NOTE — Patient Instructions (Signed)
Visit Information  Ms. Margaret Aguilar  - as a part of your Medicaid benefit, you are eligible for care management and care coordination services at no cost or copay. I was unable to reach you by phone today but would be happy to help you with your health related needs. Please feel free to call me @ [phone].   A member of the Managed Medicaid care management team will reach out to you again over the next 45 days.   [name], BSW, [place]  High Risk Managed Medicaid Team

## 2020-04-02 NOTE — Patient Outreach (Signed)
Care Coordination  04/02/2020  Cathryn Gallery Sopko 1961/03/05 [id number]   Medicaid Managed Care   Unsuccessful Outreach Note  04/02/2020 Name: Margaret Aguilar MRN: [id number] DOB: [birth year]  Referred by: [name], NP Reason for referral : No chief complaint on file.   An unsuccessful telephone outreach was attempted today. The patient was referred to the case management team for assistance with care management and care coordination.   Follow Up Plan: BSW will follow up in 45 days.  [name], BSW, [place]  High Risk Managed Medicaid Team

## 2020-04-09 ENCOUNTER — Encounter: Payer: Self-pay | Admitting: Neurology

## 2020-04-09 ENCOUNTER — Institutional Professional Consult (permissible substitution): Payer: Medicaid Other | Admitting: Neurology

## 2020-04-09 ENCOUNTER — Telehealth: Payer: Self-pay

## 2020-04-09 NOTE — Telephone Encounter (Signed)
Pt did not show for their appt with Dr. Athar today.  

## 2020-05-01 ENCOUNTER — Ambulatory Visit: Payer: Self-pay

## 2020-05-02 ENCOUNTER — Encounter: Payer: Self-pay | Admitting: Nurse Practitioner

## 2020-05-02 ENCOUNTER — Telehealth (INDEPENDENT_AMBULATORY_CARE_PROVIDER_SITE_OTHER): Payer: Medicaid Other | Admitting: Nurse Practitioner

## 2020-05-02 VITALS — BP 142/90 | HR 58 | Ht 62.0 in | Wt 230.0 lb

## 2020-05-02 DIAGNOSIS — Z20822 Contact with and (suspected) exposure to covid-19: Secondary | ICD-10-CM | POA: Diagnosis not present

## 2020-05-02 DIAGNOSIS — G4452 New daily persistent headache (NDPH): Secondary | ICD-10-CM

## 2020-05-02 DIAGNOSIS — I10 Essential (primary) hypertension: Secondary | ICD-10-CM | POA: Diagnosis not present

## 2020-05-02 DIAGNOSIS — J018 Other acute sinusitis: Secondary | ICD-10-CM | POA: Diagnosis not present

## 2020-05-02 MED ORDER — CORICIDIN HBP 10-325-2 MG PO TABS: 1.0000 | ORAL_TABLET | Freq: Two times a day (BID) | ORAL | 0 refills | Status: AC

## 2020-05-02 MED ORDER — AMOXICILLIN-POT CLAVULANATE 875-125 MG PO TABS: 1.0000 | ORAL_TABLET | Freq: Two times a day (BID) | ORAL | 0 refills | Status: AC

## 2020-05-02 NOTE — Progress Notes (Signed)
   [place], [place]  [address] Phone:  [phone]   Fax:  [phone] Virtual Visit via Telephone Note  I connected with Margaret Aguilar on 05/02/20 at 11:20 AM EST by telephone and verified that I am speaking with the correct person using two identifiers.   I discussed the limitations, risks, security and privacy concerns of performing an evaluation and management service by telephone and the availability of in person appointments. I also discussed with the patient that there may be a patient responsible charge related to this service. The patient expressed understanding and agreed to proceed.  Patient home Provider Office  History of Present Illness:   Headache Patient presents for evaluation of headache. Symptoms began about 2 days ago. Generally, the headaches last about several hours and occur every day. The headaches do not seem to be related to any time of the day. The headaches are usually throbbing and are located in top of head.  The patient rates her most severe headaches a 5 on a scale from 1 to 10. Recently, the headaches have been increasing in severity. Work attendance or other daily activities are affected by the headaches. Precipitating factors include: URI symptoms. She felt like it maybe related sinus symptoms but  The headaches are usually not preceded by an aura. Associated neurologic symptoms: decreased physical activity, dizziness and numbness oer the bridge of her nose. The patient denies loss of balance, muscle weakness, speech difficulties and vomiting in the early morning. Home treatment has included acetaminophen with little improvement. Other history includes: allergic rhinitis.  When asked , she admits that her son and his family tested positive for COVID, they are also her neighbors.  Observations/Objective: Virtual visit 200/100 she had not taken her medication. She admits that she had failed her medication. She  forgot.  148/88 142/92 Assessment and Plan: Assessment  Primary Diagnosis & Pertinent Problem List: The primary encounter diagnosis was Essential hypertension. Diagnoses of New daily persistent headache, Contact with and (suspected) exposure to covid-19, and Other subacute sinusitis were also pertinent to this visit.  Visit Diagnosis: 1. Essential hypertension  Encouraged on going compliance with current medication regimen; setting a daily timer. Encouraged home monitoring and recording BP <130/80 Eating a heart-healthy diet with less salt Encouraged regular physical activity  Educational material provided  Recommend Weight loss  2. New daily persistent headache  Spoke her with uncontrolled HTN and symptoms  Educational material provided  3. Contact with and (suspected) exposure to covid-19  Encouraged mobile testing via [facility] information provided   4. Other subacute sinusitis Coricidin HBP for 2 days if after 2 days additional tx is needed then pt was prescribed Augmentin  educational material provided     Follow Up Instructions:    I discussed the assessment and treatment plan with the patient. The patient was provided an opportunity to ask questions and all were answered. The patient agreed with the plan and demonstrated an understanding of the instructions.   The patient was advised to call back or seek an in-person evaluation if the symptoms worsen or if the condition fails to improve as anticipated.  I provided 13 minutes of video- face-to-face time during this encounter.   Margaret Francois, NP

## 2020-05-02 NOTE — Patient Instructions (Signed)
[place] COVID testing site.   [facility] at [facility]  Location: [address] [place], [place], [place], [place] Phone: [phone]  COVID-19 Quarantine vs. Isolation Margaret Aguilar keeps someone who was in close contact with someone who has COVID-19 away from others. Quarantine if you have been in close contact with someone who has COVID-19, unless you have been fully vaccinated. If you are fully vaccinated  You do NOT need to quarantine unless they have symptoms  Get tested 3-5 days after your exposure, even if you don't have symptoms  Wear a mask indoors in public for 14 days following exposure or until your test result is negative If you are not fully vaccinated  Stay home for 14 days after your last contact with a person who has COVID-19  Watch for fever (100.52F), cough, shortness of breath, or other symptoms of COVID-19  If possible, stay away from people you live with, especially people who are at higher risk for getting very sick from COVID-19  Contact your local public health department for options in your area to possibly shorten your quarantine ISOLATION keeps someone who is sick or tested positive for COVID-19 without symptoms away from others, even in their own home. People who are in isolation should stay home and stay in a specific "sick room" or area and use a separate bathroom (if available). If you are sick and think or know you have COVID-19 Stay home until after  At least 10 days since symptoms first appeared and  At least 24 hours with no fever without the use of fever-reducing medications and  Symptoms have improved If you tested positive for COVID-19 but do not have symptoms  Stay home until after 10 days have passed since your positive viral test  If you develop symptoms after testing positive, follow the steps above for those who are sick Margaret Aguilar 01/09/2020 This information is not intended to replace advice given to you by your health  care provider. Make sure you discuss any questions you have with your health care provider. Document Revised: 02/13/2020 Document Reviewed: 02/13/2020 Elsevier Patient Education  2021 [place] Your Hypertension Hypertension, also called high blood pressure, is when the force of the blood pressing against the walls of the arteries is too strong. Arteries are blood vessels that carry blood from your heart throughout your body. Hypertension forces the heart to work harder to pump blood and may cause the arteries to become narrow or stiff. Understanding blood pressure readings Your personal target blood pressure may vary depending on your medical conditions, your age, and other factors. A blood pressure reading includes a higher number over a lower number. Ideally, your blood pressure should be below 120/80. You should know that:  The first, or top, number is called the systolic pressure. It is a measure of the pressure in your arteries as your heart beats.  The second, or bottom number, is called the diastolic pressure. It is a measure of the pressure in your arteries as the heart relaxes. Blood pressure is classified into four stages. Based on your blood pressure reading, your health care provider may use the following stages to determine what type of treatment you need, if any. Systolic pressure and diastolic pressure are measured in a unit called mmHg. Normal  Systolic pressure: below 784.  Diastolic pressure: below 80. Elevated  Systolic pressure: 696-295.  Diastolic pressure: below 80. Hypertension stage 1  Systolic pressure: 284-132.  Diastolic pressure: 44-01. Hypertension stage 2  Systolic pressure: 502 or above.  Diastolic pressure: 90 or above. How can this condition affect me? Managing your hypertension is an important responsibility. Over time, hypertension can damage the arteries and decrease blood flow to important parts of the body, including the  brain, heart, and kidneys. Having untreated or uncontrolled hypertension can lead to:  A heart attack.  A stroke.  A weakened blood vessel (aneurysm).  Heart failure.  Kidney damage.  Eye damage.  Metabolic syndrome.  Memory and concentration problems.  Vascular dementia. What actions can I take to manage this condition? Hypertension can be managed by making lifestyle changes and possibly by taking medicines. Your health care provider will help you make a plan to bring your blood pressure within a normal range. Nutrition  Eat a diet that is high in fiber and potassium, and low in salt (sodium), added sugar, and fat. An example eating plan is called the Dietary Approaches to Stop Hypertension (DASH) diet. To eat this way: ? Eat plenty of fresh fruits and vegetables. Try to fill one-half of your plate at each meal with fruits and vegetables. ? Eat whole grains, such as whole-wheat pasta, brown rice, or whole-grain bread. Fill about one-fourth of your plate with whole grains. ? Eat low-fat dairy products. ? Avoid fatty cuts of meat, processed or cured meats, and poultry with skin. Fill about one-fourth of your plate with lean proteins such as fish, chicken without skin, beans, eggs, and tofu. ? Avoid pre-made and processed foods. These tend to be higher in sodium, added sugar, and fat.  Reduce your daily sodium intake. Most people with hypertension should eat less than 1,500 mg of sodium a day.   Lifestyle  Work with your health care provider to maintain a healthy body weight or to lose weight. Ask what an ideal weight is for you.  Get at least 30 minutes of exercise that causes your heart to beat faster (aerobic exercise) most days of the week. Activities may include walking, swimming, or biking.  Include exercise to strengthen your muscles (resistance exercise), such as weight lifting, as part of your weekly exercise routine. Try to do these types of exercises for 30 minutes at  least 3 days a week.  Do not use any products that contain nicotine or tobacco, such as cigarettes, e-cigarettes, and chewing tobacco. If you need help quitting, ask your health care provider.  Control any long-term (chronic) conditions you have, such as high cholesterol or diabetes.  Identify your sources of stress and find ways to manage stress. This may include meditation, deep breathing, or making time for fun activities.   Alcohol use  Do not drink alcohol if: ? Your health care provider tells you not to drink. ? You are pregnant, may be pregnant, or are planning to become pregnant.  If you drink alcohol: ? Limit how much you use to:  0-1 drink a day for women.  0-2 drinks a day for men. ? Be aware of how much alcohol is in your drink. In the [place] one drink equals one 12 oz bottle of beer (355 mL), one 5 oz glass of wine (148 mL), or one 1 oz glass of hard liquor (44 mL). Medicines Your health care provider may prescribe medicine if lifestyle changes are not enough to get your blood pressure under control and if:  Your systolic blood pressure is 130 or higher.  Your diastolic blood pressure is 80 or higher. Take medicines only as told by your health care  provider. Follow the directions carefully. Blood pressure medicines must be taken as told by your health care provider. The medicine does not work as well when you skip doses. Skipping doses also puts you at risk for problems. Monitoring Before you monitor your blood pressure:  Do not smoke, drink caffeinated beverages, or exercise within 30 minutes before taking a measurement.  Use the bathroom and empty your bladder (urinate).  Sit quietly for at least 5 minutes before taking measurements. Monitor your blood pressure at home as told by your health care provider. To do this:  Sit with your back straight and supported.  Place your feet flat on the floor. Do not cross your legs.  Support your arm on a flat surface, such  as a table. Make sure your upper arm is at heart level.  Each time you measure, take two or three readings one minute apart and record the results. You may also need to have your blood pressure checked regularly by your health care provider.   General information  Talk with your health care provider about your diet, exercise habits, and other lifestyle factors that may be contributing to hypertension.  Review all the medicines you take with your health care provider because there may be side effects or interactions.  Keep all visits as told by your health care provider. Your health care provider can help you create and adjust your plan for managing your high blood pressure. Where to find more information  National Heart, Lung, and Blood Institute: https://wilson-eaton.com/  American Heart Association: www.heart.org Contact a health care provider if:  You think you are having a reaction to medicines you have taken.  You have repeated (recurrent) headaches.  You feel dizzy.  You have swelling in your ankles.  You have trouble with your vision. Get help right away if:  You develop a severe headache or confusion.  You have unusual weakness or numbness, or you feel faint.  You have severe pain in your chest or abdomen.  You vomit repeatedly.  You have trouble breathing. These symptoms may represent a serious problem that is an emergency. Do not wait to see if the symptoms will go away. Get medical help right away. Call your local emergency services (911 in the [place] Do not drive yourself to the hospital. Summary  Hypertension is when the force of blood pumping through your arteries is too strong. If this condition is not controlled, it may put you at risk for serious complications.  Your personal target blood pressure may vary depending on your medical conditions, your age, and other factors. For most people, a normal blood pressure is less than 120/80.  Hypertension is managed by  lifestyle changes, medicines, or both.  Lifestyle changes to help manage hypertension include losing weight, eating a healthy, low-sodium diet, exercising more, stopping smoking, and limiting alcohol. This information is not intended to replace advice given to you by your health care provider. Make sure you discuss any questions you have with your health care provider. Document Revised: 05/06/2019 Document Reviewed: 03/01/2019 Elsevier Patient Education  2021 [place].   Sinusitis, Adult Sinusitis is soreness and swelling (inflammation) of your sinuses. Sinuses are hollow spaces in the bones around your face. They are located: Around your eyes. In the middle of your forehead. Behind your nose. In your cheekbones. Your sinuses and nasal passages are lined with a fluid called mucus. Mucus drains out of your sinuses. Swelling can trap mucus in your sinuses. This lets germs (bacteria,  virus, or fungus) grow, which leads to infection. Most of the time, this condition is caused by a virus. What are the causes? This condition is caused by: Allergies. Asthma. Germs. Things that block your nose or sinuses. Growths in the nose (nasal polyps). Chemicals or irritants in the air. Fungus (rare). What increases the risk? You are more likely to develop this condition if: You have a weak body defense system (immune system). You do a lot of swimming or diving. You use nasal sprays too much. You smoke. What are the signs or symptoms? The main symptoms of this condition are pain and a feeling of pressure around the sinuses. Other symptoms include: Stuffy nose (congestion). Runny nose (drainage). Swelling and warmth in the sinuses. Headache. Toothache. A cough that may get worse at night. Mucus that collects in the throat or the back of the nose (postnasal drip). Being unable to smell and taste. Being very tired (fatigue). A fever. Sore throat. Bad breath. How is this diagnosed? This  condition is diagnosed based on: Your symptoms. Your medical history. A physical exam. Tests to find out if your condition is short-term (acute) or long-term (chronic). Your doctor may: Check your nose for growths (polyps). Check your sinuses using a tool that has a light (endoscope). Check for allergies or germs. Do imaging tests, such as an MRI or CT scan. How is this treated? Treatment for this condition depends on the cause and whether it is short-term or long-term. If caused by a virus, your symptoms should go away on their own within 10 days. You may be given medicines to relieve symptoms. They include: Medicines that shrink swollen tissue in the nose. Medicines that treat allergies (antihistamines). A spray that treats swelling of the nostrils. Rinses that help get rid of thick mucus in your nose (nasal saline washes). If caused by bacteria, your doctor may wait to see if you will get better without treatment. You may be given antibiotic medicine if you have: A very bad infection. A weak body defense system. If caused by growths in the nose, you may need to have surgery. Follow these instructions at home: Medicines Take, use, or apply over-the-counter and prescription medicines only as told by your doctor. These may include nasal sprays. If you were prescribed an antibiotic medicine, take it as told by your doctor. Do not stop taking the antibiotic even if you start to feel better. Hydrate and humidify Drink enough water to keep your pee (urine) pale yellow. Use a cool mist humidifier to keep the humidity level in your home above 50%. Breathe in steam for 10-15 minutes, 3-4 times a day, or as told by your doctor. You can do this in the bathroom while a hot shower is running. Try not to spend time in cool or Aguilar air.   Rest Rest as much as you can. Sleep with your head raised (elevated). Make sure you get enough sleep each night. General instructions Put a warm, moist  washcloth on your face 3-4 times a day, or as often as told by your doctor. This will help with discomfort. Wash your hands often with soap and water. If there is no soap and water, use hand sanitizer. Do not smoke. Avoid being around people who are smoking (secondhand smoke). Keep all follow-up visits as told by your doctor. This is important.   Contact a doctor if: You have a fever. Your symptoms get worse. Your symptoms do not get better within 10 days. Get help right  away if: You have a very bad headache. You cannot stop throwing up (vomiting). You have very bad pain or swelling around your face or eyes. You have trouble seeing. You feel confused. Your neck is stiff. You have trouble breathing. Summary Sinusitis is swelling of your sinuses. Sinuses are hollow spaces in the bones around your face. This condition is caused by tissues in your nose that become inflamed or swollen. This traps germs. These can lead to infection. If you were prescribed an antibiotic medicine, take it as told by your doctor. Do not stop taking it even if you start to feel better. Keep all follow-up visits as told by your doctor. This is important. This information is not intended to replace advice given to you by your health care provider. Make sure you discuss any questions you have with your health care provider. Document Revised: 08/31/2017 Document Reviewed: 08/31/2017 Elsevier Patient Education  2021 [employer].

## 2020-05-03 ENCOUNTER — Other Ambulatory Visit: Payer: Self-pay | Admitting: *Deleted

## 2020-05-03 ENCOUNTER — Other Ambulatory Visit: Payer: Self-pay

## 2020-05-03 NOTE — Patient Instructions (Signed)
Visit Information  Ms. Wrage was given information about Medicaid Managed Care team care coordination services as a part of their Healthy [facility] Medicaid benefit. Marcedes Tech Schlotter verbally consented to engagement with the [facility] Managed Care team.   For questions related to your Healthy [facility] health plan, please call: [phone] or visit the homepage here: [email or web address]  If you would like to schedule transportation through your Healthy [facility] plan, please call the following number at least 2 days in advance of your appointment: [phone]  Ms. Benedick - following are the goals we discussed in your visit today:  Goals Addressed              This Visit's Progress   .  COMPLETED: "I want to talk to someone about my breathing and sleeping" (pt-stated)        [place] Medicaid Managed Care (see longitudinal plan of care for additional care plan information)  Current Barriers:  [name] Knowledge Deficits related to appropriate evaluations for sleep study, respiratory support; patient reports her husband observes periods of "not breathing" during the night; patient had sleep study in 2015 but was unable to afford CPAP at the time; wishes to be re-evaluated; also asks about getting a Ventolin inhaler as she is concerned about breathing problems since the advent of COVID  Sleep study consult scheduled for 04/09/20 @ 9am. Update-Appointment rescheduled to 05/17/20  Nurse Case Manager Clinical Goal(s):  [name] Over the next 14 days, patient will verbalize understanding of plan for referral placed by PCP for sleep study-met  Interventions:  . Inter-disciplinary care team collaboration (see longitudinal plan of care) . Discussed plans with patient for ongoing care management follow up and provided patient with direct contact information for care management team . Reviewed scheduled/upcoming provider appointments including: PCP 03/07/20 and  Sleep Study Consult 04/09/20  Patient Self Care Activities:  . Patient will attend sleep study 04/09/20 . Patient verbalizes ways to care for herself by engaging in light exercise and self care activities . Patient will use albuterol inhaler as needed, as prescribed  Please see past updates related to this goal by clicking on the "Past Updates" button in the selected goal          Please see education materials related to COVID and HTN provided by MyChart link.   COVID-19: What to Do if You Are Sick If you have a fever, cough or other symptoms, you might have COVID-19. Most people have mild illness and are able to recover at home. If you are sick:  Keep track of your symptoms.  If you have an emergency warning sign (including trouble breathing), call 911. Steps to help prevent the spread of COVID-19 if you are sick If you are sick with COVID-19 or think you might have COVID-19, follow the steps below to care for yourself and to help protect other people in your home and community. Stay home except to get medical care  Stay home. Most people with COVID-19 have mild illness and can recover at home without medical care. Do not leave your home, except to get medical care. Do not visit public areas.  Take care of yourself. Get rest and stay hydrated. Take over-the-counter medicines, such as acetaminophen, to help you feel better.  Stay in touch with your doctor. Call before you get medical care. Be sure to get care if you have trouble breathing, or have any other emergency warning signs, or if you think it is an emergency.  Avoid  public transportation, ride-sharing, or taxis. Separate yourself from other people As much as possible, stay in a specific room and away from other people and pets in your home. If possible, you should use a separate bathroom. If you need to be around other people or animals in or outside of the home, wear a mask. Tell your close contactsthat they may have been  exposed to COVID-19. An infected person can spread COVID-19 starting 48 hours (or 2 days) before the person has any symptoms or tests positive. By letting your close contacts know they may have been exposed to COVID-19, you are helping to protect everyone.  Additional guidance is available for those living in close quarters and shared housing.  See COVID-19 and Animals if you have questions about pets.  If you are diagnosed with COVID-19, someone from the health department may call you. Answer the call to slow the spread. Monitor your symptoms  Symptoms of COVID-19 include fever, cough, or other symptoms.  Follow care instructions from your healthcare provider and local health department. Your local health authorities may give instructions on checking your symptoms and reporting information. When to seek emergency medical attention Look for emergency warning signs* for COVID-19. If someone is showing any of these signs, seek emergency medical care immediately:  Trouble breathing  Persistent pain or pressure in the chest  New confusion  Inability to wake or stay awake  Pale, gray, or blue-colored skin, lips, or nail beds, depending on skin tone *This list is not all possible symptoms. Please call your medical provider for any other symptoms that are severe or concerning to you. Call 911 or call ahead to your local emergency facility: Notify the operator that you are seeking care for someone who has or may have COVID-19. Call ahead before visiting your doctor  Call ahead. Many medical visits for routine care are being postponed or done by phone or telemedicine.  If you have a medical appointment that cannot be postponed, call your doctor's office, and tell them you have or may have COVID-19. This will help the office protect themselves and other patients. Get  tested  If you have symptoms of COVID-19, get tested. While waiting for test results, you stay away from others, including  staying apart from those living in your household.  You can visit your state, tribal, local, and territorialhealth department's website to look for the latest local information on testing sites. If you are sick, wear a mask over your nose and mouth  You should wear a mask over your nose and mouth if you must be around other people or animals, including pets (even at home).  You don't need to wear the mask if you are alone. If you can't put on a mask (because of trouble breathing, for example), cover your coughs and sneezes in some other way. Try to stay at least 6 feet away from other people. This will help protect the people around you.  Masks should not be placed on young children under age 2 years, anyone who has trouble breathing, or anyone who is not able to remove the mask without help. Note: During the COVID-19 pandemic, medical grade facemasks are reserved for healthcare workers and some first responders. Cover your coughs and sneezes  Cover your mouth and nose with a tissue when you cough or sneeze.  Throw away used tissues in a lined trash can.  Immediately wash your hands with soap and water for at least 20 seconds. If soap and water  are not available, clean your hands with an alcohol-based hand sanitizer that contains at least 60% alcohol. Clean your hands often  Wash your hands often with soap and water for at least 20 seconds. This is especially important after blowing your nose, coughing, or sneezing; going to the bathroom; and before eating or preparing food.  Use hand sanitizer if soap and water are not available. Use an alcohol-based hand sanitizer with at least 60% alcohol, covering all surfaces of your hands and rubbing them together until they feel dry.  Soap and water are the best option, especially if hands are visibly dirty.  Avoid touching your eyes, nose, and mouth with unwashed hands.  Handwashing Tips Avoid sharing personal household items  Do not share  dishes, drinking glasses, cups, eating utensils, towels, or bedding with other people in your home.  Wash these items thoroughly after using them with soap and water or put in the dishwasher. Clean all "high-touch" surfaces everyday  Clean and disinfect high-touch surfaces in your "sick room" and bathroom; wear disposable gloves. Let someone else clean and disinfect surfaces in common areas, but you should clean your bedroom and bathroom, if possible.  If a caregiver or other person needs to clean and disinfect a sick person's bedroom or bathroom, they should do so on an as-needed basis. The caregiver/other person should wear a mask and disposable gloves prior to cleaning. They should wait as long as possible after the person who is sick has used the bathroom before coming in to clean and use the bathroom. ? High-touch surfaces include phones, remote controls, counters, tabletops, doorknobs, bathroom fixtures, toilets, keyboards, tablets, and bedside tables.  Clean and disinfect areas that may have blood, stool, or body fluids on them.  Use household cleaners and disinfectants. Clean the area or item with soap and water or another detergent if it is dirty. Then, use a household disinfectant. ? Be sure to follow the instructions on the label to ensure safe and effective use of the product. Many products recommend keeping the surface wet for several minutes to ensure germs are killed. Many also recommend precautions such as wearing gloves and making sure you have good ventilation during use of the product. ? Use a product from [employer] List N: Disinfectants for Coronavirus (RPRXY-58). ? Complete Disinfection Guidance When you can be around others after being sick with COVID-19 Deciding when you can be around others is different for different situations. Find out when you can safely end home isolation. For any additional questions about your care, contact your healthcare provider or state or local health  department. 06/29/2019 Content source: [employer] for Immunization and Respiratory Diseases ([facility]), Division of Viral Diseases This information is not intended to replace advice given to you by your health care provider. Make sure you discuss any questions you have with your health care provider. Document Revised: 02/13/2020 Document Reviewed: 02/13/2020 Elsevier Patient Education  2021 [place].   Hypertension, Adult Hypertension is another name for high blood pressure. High blood pressure forces your heart to work harder to pump blood. This can cause problems over time. There are two numbers in a blood pressure reading. There is a top number (systolic) over a bottom number (diastolic). It is best to have a blood pressure that is below 120/80. Healthy choices can help lower your blood pressure, or you may need medicine to help lower it. What are the causes? The cause of this condition is not known. Some conditions may be related to  high blood pressure. What increases the risk?  Smoking.  Having type 2 diabetes mellitus, high cholesterol, or both.  Not getting enough exercise or physical activity.  Being overweight.  Having too much fat, sugar, calories, or salt (sodium) in your diet.  Drinking too much alcohol.  Having long-term (chronic) kidney disease.  Having a family history of high blood pressure.  Age. Risk increases with age.  Race. You may be at higher risk if you are African American.  Gender. Men are at higher risk than women before age 85. After age 37, women are at higher risk than men.  Having obstructive sleep apnea.  Stress. What are the signs or symptoms?  High blood pressure may not cause symptoms. Very high blood pressure (hypertensive crisis) may cause: ? Headache. ? Feelings of worry or nervousness (anxiety). ? Shortness of breath. ? Nosebleed. ? A feeling of being sick to your stomach (nausea). ? Throwing up (vomiting). ? Changes in  how you see. ? Very bad chest pain. ? Seizures. How is this treated?  This condition is treated by making healthy lifestyle changes, such as: ? Eating healthy foods. ? Exercising more. ? Drinking less alcohol.  Your health care provider may prescribe medicine if lifestyle changes are not enough to get your blood pressure under control, and if: ? Your top number is above 130. ? Your bottom number is above 80.  Your personal target blood pressure may vary. Follow these instructions at home: Eating and drinking  If told, follow the DASH eating plan. To follow this plan: ? Fill one half of your plate at each meal with fruits and vegetables. ? Fill one fourth of your plate at each meal with whole grains. Whole grains include whole-wheat pasta, brown rice, and whole-grain bread. ? Eat or drink low-fat dairy products, such as skim milk or low-fat yogurt. ? Fill one fourth of your plate at each meal with low-fat (lean) proteins. Low-fat proteins include fish, chicken without skin, eggs, beans, and tofu. ? Avoid fatty meat, cured and processed meat, or chicken with skin. ? Avoid pre-made or processed food.  Eat less than 1,500 mg of salt each day.  Do not drink alcohol if: ? Your doctor tells you not to drink. ? You are pregnant, may be pregnant, or are planning to become pregnant.  If you drink alcohol: ? Limit how much you use to:  0-1 drink a day for women.  0-2 drinks a day for men. ? Be aware of how much alcohol is in your drink. In the [place] one drink equals one 12 oz bottle of beer (355 mL), one 5 oz glass of wine (148 mL), or one 1 oz glass of hard liquor (44 mL).   Lifestyle  Work with your doctor to stay at a healthy weight or to lose weight. Ask your doctor what the best weight is for you.  Get at least 30 minutes of exercise most days of the week. This may include walking, swimming, or biking.  Get at least 30 minutes of exercise that strengthens your muscles  (resistance exercise) at least 3 days a week. This may include lifting weights or doing Pilates.  Do not use any products that contain nicotine or tobacco, such as cigarettes, e-cigarettes, and chewing tobacco. If you need help quitting, ask your doctor.  Check your blood pressure at home as told by your doctor.  Keep all follow-up visits as told by your doctor. This is important.   Medicines  Take over-the-counter and prescription medicines only as told by your doctor. Follow directions carefully.  Do not skip doses of blood pressure medicine. The medicine does not work as well if you skip doses. Skipping doses also puts you at risk for problems.  Ask your doctor about side effects or reactions to medicines that you should watch for. Contact a doctor if you:  Think you are having a reaction to the medicine you are taking.  Have headaches that keep coming back (recurring).  Feel dizzy.  Have swelling in your ankles.  Have trouble with your vision. Get help right away if you:  Get a very bad headache.  Start to feel mixed up (confused).  Feel weak or numb.  Feel faint.  Have very bad pain in your: ? Chest. ? Belly (abdomen).  Throw up more than once.  Have trouble breathing. Summary  Hypertension is another name for high blood pressure.  High blood pressure forces your heart to work harder to pump blood.  For most people, a normal blood pressure is less than 120/80.  Making healthy choices can help lower blood pressure. If your blood pressure does not get lower with healthy choices, you may need to take medicine. This information is not intended to replace advice given to you by your health care provider. Make sure you discuss any questions you have with your health care provider. Document Revised: 12/09/2017 Document Reviewed: 12/09/2017 Elsevier Patient Education  2021 [place].   Patient verbalizes understanding of instructions provided today.    Telephone follow up appointment with Managed Medicaid care management team member scheduled for:05/17/20 @ 1pm  Melissa Montane, RN  Following is a copy of your plan of care:  Patient Care Plan: General Plan of Care (Adult)    Problem Identified: Hypertension (Hypertension)     Long-Range Goal: Managing Hypertension   Start Date: 05/03/2020  Expected End Date: 07/05/2020  This Visit's Progress: On track  Priority: High  Note:   Current Barriers:  . Chronic Disease Management support and education needs related to Hypertension. Ms Soza was seen by her PCP on 05/02/20 with a BP of 200/100, she had forgotten to take her BP medication. She reports that she has taken her medicine today, but she is unable to check her BP at this time. She also reports headache, congestion and fatigue with a recent exposure to COVID-19. Patient was encouraged to go for testing yesterday while in the office-she is planning to go today.   Nurse Case Manager Clinical Goal(s):  [name] Over the next 30 days, patient will verbalize understanding of plan for hypertension . Over the next 14 days, patient will meet with RN Care Manager to address possible COVID-19 . Over the next 30 days, patient will demonstrate improved adherence to prescribed treatment plan for HTN as evidenced by taking BP medications as directed every day  and attending all scheduled appointments  Interventions:  . Inter-disciplinary care team collaboration (see longitudinal plan of care) . Evaluation of current treatment plan related to HTN and patient's adherence to plan as established by provider. . Advised patient to be tested for COVID, quarentine, hand washing, social distancing, and call PCP if symptoms worsen . Provided education to patient re: COVID and HTN . Reviewed medications with patient and discussed the importance of taking as prescribed  Patient Goals/Self-Care Activities Over the next 30 days, patient will:  -Self administers  medications as prescribed  -Attends all scheduled provider appointments  -Calls pharmacy for medication  refills  -Calls provider office for new concerns or questions  Follow Up Plan: Telephone follow up appointment with Managed Medicaid care management team member scheduled for:05/17/20 @ 1pm

## 2020-05-03 NOTE — Patient Outreach (Signed)
Medicaid Managed Care   Nurse Care Manager Note  05/03/2020 Name:  Margaret Aguilar MRN:  [id number] DOB:  26-Nov-1960  Margaret Aguilar is an 60 y.o. year old female who is a primary patient of [name], NP.  The [facility] Managed Care Coordination team was consulted for assistance with:    HTN  [name] was given information about Medicaid Managed Care Coordination team services today. Margaret Aguilar agreed to services and verbal consent obtained.  Engaged with patient by telephone for follow up visit in response to provider referral for case management and/or care coordination services.   Assessments/Interventions:  Review of past medical history, allergies, medications, health status, including review of consultants reports, laboratory and other test data, was performed as part of comprehensive evaluation and provision of chronic care management services.  SDOH (Social Determinants of Health) assessments and interventions performed:   Care Plan  Allergies  Allergen Reactions  . Nutritional Supplements     Walnut >> UNSPECIFIED REACTION  Severity from [place]  . Latex Swelling and Rash    SWELLING REACTION UNSPECIFIED     Medications Reviewed Today    Reviewed by Melissa Montane, RN (Registered Nurse) on 05/03/20 at 62  Med List Status: <None>  Medication Order Taking? Sig Documenting Provider Last Dose Status Informant  albuterol (PROVENTIL) (2.5 MG/3ML) 0.083% nebulizer solution [id number] Yes Take 3 mLs (2.5 mg total) by nebulization every 6 (six) hours as needed for wheezing or shortness of breath. [name], NP Taking Active   albuterol (VENTOLIN HFA) 108 (90 Base) MCG/ACT inhaler [id number] Yes Inhale 2 puffs into the lungs every 6 (six) hours as needed for wheezing or shortness of breath. [name], NP Taking Active   allopurinol (ZYLOPRIM) 100 MG tablet [id number] Yes Take 100 mg by mouth daily. [provider] Taking Active Self   amLODipine (NORVASC) 10 MG tablet [id number] Yes Take 10 mg by mouth daily. [provider] Taking Active   amoxicillin-clavulanate (AUGMENTIN) 875-125 MG tablet [id number] No Take 1 tablet by mouth 2 (two) times daily for 10 days. Wait and see  Patient not taking: Reported on 05/03/2020   [name], NP Not Taking Active            Med Note ([name] A   Thu May 03, 2020  1:38 PM) Needs to pick up prescription  DM-APAP-CPM (CORICIDIN HBP) 10-325-2 MG TABS [id number] No Take 1 tablet by mouth 2 (two) times daily for 10 days.  Patient not taking: Reported on 05/03/2020   [name], NP Not Taking Active            Med Note ([name] A   Thu May 03, 2020  1:38 PM) Needs to pick up presription  isosorbide-hydrALAZINE (BIDIL) 20-37.5 MG tablet [id number] Yes Take by mouth 2 (two) times daily. [provider] Taking Active   metoprolol succinate (TOPROL-XL) 100 MG 24 hr tablet [id number] Yes Take 50 mg by mouth daily. Take with or immediately following a meal. [provider] Taking Active Self  montelukast (SINGULAIR) 10 MG tablet [id number] Yes Take 1 tablet (10 mg total) by mouth at bedtime. [name], NP Taking Active           Patient Active Problem List   Diagnosis Date Noted  . Essential hypertension 12/14/2019  . Mild intermittent asthma without complication 28/00/3491  . Vitamin D deficiency 11/24/2019  . Bradycardia 08/16/2018  . Gout attack 08/09/2018  .  Acute on chronic renal failure ([place]) 08/08/2018  . CKD (chronic kidney disease) stage 4, GFR 15-29 ml/min (HCC) 01/09/2017  . Spondylosis of lumbosacral region 10/09/2016  . Type 2 diabetes mellitus, without long-term current use of insulin ([place]) 09/18/2016  . IDA (iron deficiency anemia) 12/14/2013  . Guaiac + stool 12/14/2013  . Hypertension 09/28/2013  . Diabetes ([place]) 09/28/2013  . Renal insufficiency 09/28/2013  . Morbid obesity ([place]) 09/28/2013  . Obstructive sleep apnea  on CPAP 06/05/2011  . Hyperlipidemia 06/05/2011  . Gastroesophageal reflux disease 06/05/2011  . Arthritis 06/05/2011    Conditions to be addressed/monitored per PCP order:  HTN  Care Plan : General Plan of Care (Adult)  Updates made by Melissa Montane, RN since 05/03/2020 12:00 AM    Problem: Hypertension (Hypertension)     Long-Range Goal: Managing Hypertension   Start Date: 05/03/2020  Expected End Date: 07/05/2020  This Visit's Progress: On track  Priority: High  Note:   Current Barriers:  . Chronic Disease Management support and education needs related to Hypertension. Ms Kincannon was seen by her PCP on 05/02/20 with a BP of 200/100, she had forgotten to take her BP medication. She reports that she has taken her medicine today, but she is unable to check her BP at this time. She also reports headache, congestion and fatigue with a recent exposure to COVID-19. Patient was encouraged to go for testing yesterday while in the office-she is planning to go today.   Nurse Case Manager Clinical Goal(s):  [name] Over the next 30 days, patient will verbalize understanding of plan for hypertension . Over the next 14 days, patient will meet with RN Care Manager to address possible COVID-19 . Over the next 30 days, patient will demonstrate improved adherence to prescribed treatment plan for HTN as evidenced by taking BP medications as directed every day  and attending all scheduled appointments  Interventions:  . Inter-disciplinary care team collaboration (see longitudinal plan of care) . Evaluation of current treatment plan related to HTN and patient's adherence to plan as established by provider. . Advised patient to be tested for COVID, quarentine, hand washing, social distancing, and call PCP if symptoms worsen . Provided education to patient re: COVID and HTN . Reviewed medications with patient and discussed the importance of taking as prescribed  Patient Goals/Self-Care Activities Over the next  30 days, patient will:  -Self administers medications as prescribed  -Attends all scheduled provider appointments  -Calls pharmacy for medication refills  -Calls provider office for new concerns or questions  Follow Up Plan: Telephone follow up appointment with Managed Medicaid care management team member scheduled for:05/17/20 @ 1pm        Follow Up:  Patient agrees to Care Plan and Follow-up.  Plan: The Managed Medicaid care management team will reach out to the patient again over the next 14 days.  Date/time of next scheduled RN care management/care coordination outreach:  05/17/20 @ 1pm  Lurena Joiner RN, [place] RN Care Coordinator

## 2020-05-17 ENCOUNTER — Other Ambulatory Visit: Payer: Self-pay | Admitting: *Deleted

## 2020-05-17 ENCOUNTER — Encounter: Payer: Self-pay | Admitting: Neurology

## 2020-05-17 ENCOUNTER — Other Ambulatory Visit: Payer: Self-pay

## 2020-05-17 ENCOUNTER — Ambulatory Visit: Payer: Medicaid Other | Admitting: Neurology

## 2020-05-17 VITALS — BP 120/86 | HR 51 | Ht 62.0 in | Wt 239.3 lb

## 2020-05-17 DIAGNOSIS — G4719 Other hypersomnia: Secondary | ICD-10-CM

## 2020-05-17 DIAGNOSIS — R519 Headache, unspecified: Secondary | ICD-10-CM

## 2020-05-17 DIAGNOSIS — R634 Abnormal weight loss: Secondary | ICD-10-CM | POA: Diagnosis not present

## 2020-05-17 DIAGNOSIS — R351 Nocturia: Secondary | ICD-10-CM

## 2020-05-17 DIAGNOSIS — G4733 Obstructive sleep apnea (adult) (pediatric): Secondary | ICD-10-CM

## 2020-05-17 NOTE — Patient Outreach (Signed)
Medicaid Managed Care   Nurse Care Manager Note  05/17/2020 Name:  Margaret Aguilar MRN:  [id number] DOB:  03/11/1961  Margaret Aguilar is an 60 y.o. year old female who is a primary patient of [name], NP.  The [facility] Managed Care Coordination team was consulted for assistance with:    HTN  Ms. Boultinghouse was given information about Medicaid Managed Care Coordination team services today. Coralee [place] Aubert agreed to services and verbal consent obtained.  Engaged with patient by telephone for follow up visit in response to provider referral for case management and/or care coordination services.   Assessments/Interventions:  Review of past medical history, allergies, medications, health status, including review of consultants reports, laboratory and other test data, was performed as part of comprehensive evaluation and provision of chronic care management services.  SDOH (Social Determinants of Health) assessments and interventions performed:   Care Plan  Allergies  Allergen Reactions  . Nutritional Supplements     Walnut >> UNSPECIFIED REACTION  Severity from [place]  . Latex Swelling and Rash    SWELLING REACTION UNSPECIFIED     Medications Reviewed Today    Reviewed by Melissa Montane, RN (Registered Nurse) on 05/17/20 at 48  Med List Status: <None>  Medication Order Taking? Sig Documenting Provider Last Dose Status Informant  albuterol (PROVENTIL) (2.5 MG/3ML) 0.083% nebulizer solution [id number] Yes Take 3 mLs (2.5 mg total) by nebulization every 6 (six) hours as needed for wheezing or shortness of breath. [name], NP Taking Active   albuterol (VENTOLIN HFA) 108 (90 Base) MCG/ACT inhaler [id number] Yes Inhale 2 puffs into the lungs every 6 (six) hours as needed for wheezing or shortness of breath. [name], NP Taking Active   allopurinol (ZYLOPRIM) 100 MG tablet [id number] Yes Take 100 mg by mouth daily. [provider] Taking Active Self   amLODipine (NORVASC) 10 MG tablet [id number] Yes Take 10 mg by mouth daily. [provider] Taking Active   calcitRIOL (ROCALTROL) 0.25 MCG capsule [id number] Yes Take 0.25 mcg by mouth daily. [provider] Taking Active   isosorbide-hydrALAZINE (BIDIL) 20-37.5 MG tablet [id number] Yes Take by mouth 2 (two) times daily. [provider] Taking Active   metoprolol succinate (TOPROL-XL) 100 MG 24 hr tablet [id number] Yes Take 50 mg by mouth daily. Take with or immediately following a meal. [provider] Taking Active Self  montelukast (SINGULAIR) 10 MG tablet [id number] Yes Take 1 tablet (10 mg total) by mouth at bedtime. [name], NP Taking Active           Patient Active Problem List   Diagnosis Date Noted  . Essential hypertension 12/14/2019  . Mild intermittent asthma without complication 34/91/7915  . Vitamin D deficiency 11/24/2019  . Bradycardia 08/16/2018  . Gout attack 08/09/2018  . Acute on chronic renal failure ([place]) 08/08/2018  . CKD (chronic kidney disease) stage 4, GFR 15-29 ml/min (HCC) 01/09/2017  . Spondylosis of lumbosacral region 10/09/2016  . Type 2 diabetes mellitus, without long-term current use of insulin ([place]) 09/18/2016  . IDA (iron deficiency anemia) 12/14/2013  . Guaiac + stool 12/14/2013  . Hypertension 09/28/2013  . Diabetes ([place]) 09/28/2013  . Renal insufficiency 09/28/2013  . Morbid obesity ([place]) 09/28/2013  . Obstructive sleep apnea on CPAP 06/05/2011  . Hyperlipidemia 06/05/2011  . Gastroesophageal reflux disease 06/05/2011  . Arthritis 06/05/2011    Conditions to be addressed/monitored per PCP order:  HTN  Care Plan : General Plan of  Care (Adult)  Updates made by Melissa Montane, RN since 05/17/2020 12:00 AM    Problem: Hypertension (Hypertension)     Long-Range Goal: Managing Hypertension   Start Date: 05/03/2020  Expected End Date: 07/05/2020  Recent Progress: On track  Priority: High  Note:    Current Barriers:  . Chronic Disease Management support and education needs related to Hypertension. Ms Arntson was seen by her PCP on 05/02/20 with a BP of 200/100, she had forgotten to take her BP medication. She reports that she has taken her medicine today, but she is unable to check her BP at this time. She also reports headache, congestion and fatigue with a recent exposure to COVID-19. Patient had a negative home test on 05/03/20. Ms Ueda had a consult for sleep disorder today and is waiting to hear back for a plan to address sleep apnea (surgical or CPAP).  BP today 130/86, Ms Basques is very happy with this reading.   Nurse Case Manager Clinical Goal(s):  [name] Over the next 30 days, patient will verbalize understanding of plan for hypertension . Over the next 14 days, patient will meet with RN Care Manager to address possible COVID-19-Met-home test was negative . Over the next 30 days, patient will demonstrate improved adherence to prescribed treatment plan for HTN as evidenced by taking BP medications as directed every day  and attending all scheduled appointments . Over the next 30 days, patient will call to reschedule recently missed appointment with Dr. [name] . Over the next 30 days, patient will verbalize understanding for plan for sleep apnea  Interventions:  . Inter-disciplinary care team collaboration (see longitudinal plan of care) . Evaluation of current treatment plan related to HTN and patient's adherence to plan as established by provider. . Advised patient to be tested for COVID, quarentine, hand washing, social distancing, and call PCP if symptoms worsen . Provided education to patient re: COVID and HTN . Reviewed medications with patient and discussed the importance of taking as prescribed . Provide education on diabetic diet via MyChart . Discussed importance of keeping albuterol inhaler with her at all times, especially when she goes outside  Patient Goals/Self-Care  Activities Over the next 30 days, patient will: -check BP as directed, reporting abnormal readings to my PCP  -Self administers medications as prescribed  -Attends all scheduled provider appointments  -Calls pharmacy for medication refills  -Calls provider office for new concerns or questions  -call to reschedule appointment with Dr. [name]  -establish [employer] membership and begin to increase activity  -keep albuterol inhaler close by at all times  Follow Up Plan: Telephone follow up appointment with Managed Medicaid care management team member scheduled for:07/17/20 @ 1pm        Follow Up:  Patient agrees to Care Plan and Follow-up.  Plan: The Managed Medicaid care management team will reach out to the patient again over the next 60 days.  Date/time of next scheduled RN care management/care coordination outreach: 07/17/20 @ 1p  Lurena Joiner RN, [place] RN Care Coordinator

## 2020-05-17 NOTE — Progress Notes (Signed)
Subjective:    Patient ID: Margaret Aguilar is a 60 y.o. female.  HPI     Star Age, MD, PhD [facility] [address], [address] [place], [place] [address]  Dear Donella Stade,   I saw your patient Margaret Aguilar, upon your kind request in my sleep clinic today for initial consultation of her sleep disorder, in particular, evaluation of her prior diagnosis of obstructive sleep apnea. The patient is unaccompanied today. She missed an appointment on 04/09/20. As you know, Margaret Aguilar is a 60 year old right-handed woman with an underlying medical history of asthma, palpitations, hypertension, hyperlipidemia, gout, reflux disease, diabetes, chronic kidney disease, depression, anxiety, arthritis, anemia, back pain, bradycardia, and morbid obesity with a BMI of over 40, who was previously diagnosed with obstructive sleep apnea in 2015. She had a sleep study which was interpreted by Dr. Phillips Odor. I was able to review results. AHI was 23/h, O2 nadir 74%. She has not been on CPAP therapy. I reviewed your office note from 12/22/2019. Her Epworth sleepiness score is 16 out of 24, fatigue severity score is 16 out of 63.  She was not able to pursue sleep apnea treatment at the time due to cost as she recalls.  She has occasional morning headaches.  She has nocturia about 4-6 times on an average night and is followed by nephrology through [facility].  Bedtime is generally between 10 and 11 and rise time around 6 AM.  She lives with her husband.  She used to work as a Psychologist, sport and exercise.  She is raising her 3 great nephews and nieces, ages 69-year-old boy and 27-year-old twins, boy and girl.  She has raised them for the past 3 years.  She is a non-smoker.  She drinks no caffeine daily and drinks alcohol rarely.  She has been working on weight loss and in the past year she lost about 50 pounds but has regained some.  She would be willing to get rechecked for sleep apnea and consider  treatment.  She has been noted to have apneas by her husband.  Her Past Medical History Is Significant For: Past Medical History:  Diagnosis Date  . Anemia   . Anxiety   . Arthritis   . Asthma   . Back pain   . Bradycardia   . Depression   . Diabetes ([place])   . Diabetes mellitus without complication ([place])    x 5 yrs  . Drug use   . GERD (gastroesophageal reflux disease)   . Gout   . Herpes   . High cholesterol   . HLD (hyperlipidemia)   . Hypertension   . Joint pain   . Kidney disease    Stage 4  . 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 Palpitations   . Pneumonia    several  . Sleep apnea    hasn't gotten cpap yet  . SOB (shortness of breath)   . Vitamin D-dependent rickets     Her Past Surgical History Is Significant For: Past Surgical History:  Procedure Laterality Date  . AV FISTULA PLACEMENT Left 10/19/2018   Procedure: LEFT ARTERIOVENOUS (AV) FISTULA CREATION;  Surgeon: Waynetta Sandy, MD;  Location: [place];  Service: Vascular;  Laterality: Left;  . BASCILIC VEIN TRANSPOSITION Left 06/30/2019   Procedure: Bascilic Vein Transposition;  Surgeon: Waynetta Sandy, MD;  Location: [facility];  Service: Vascular;  Laterality: Left;  . CESAREAN SECTION  x 1  . COLONOSCOPY N/A 01/12/2014   Procedure: COLONOSCOPY;  Surgeon: Rogene Houston, MD;  [facility];  Service: Endoscopy;  Laterality: N/A;  225  . D&C    . TUBAL LIGATION      Her Family History Is Significant For: Family History  Problem Relation Age of Onset  . Diabetes Mother   . Hyperlipidemia Mother   . Depression Mother   . Anxiety disorder Mother   . Sleep apnea Mother   . Eating disorder Mother   . Obesity Mother   . Hyperlipidemia Father   . Hypertension Father   . Heart disease Father   . Sleep apnea Father   . Alcoholism Father   . Drug abuse Father   . Stroke Brother   . Diabetes Brother     Her Social History Is Significant  For: Social History   Socioeconomic History  . Marital status: Married    Spouse name: Nadara Mustard  . Number of children: Not on file  . Years of education: Not on file  . Highest education level: Not on file  Occupational History  . 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 Smoking status: Never Smoker  . Smokeless tobacco: Never Used  Vaping Use  . Vaping Use: Never used  Substance and Sexual Activity  . Alcohol use: No  . Drug use: Yes    Types: Marijuana  . Sexual activity: Not Currently    Birth control/protection: Post-menopausal  Other Topics Concern  . Not on file  Social History Narrative  . Not on file   Social Determinants of Health   Financial Resource Strain: Medium Risk  . Difficulty of Paying Living Expenses: Somewhat hard  Food Insecurity: No Food Insecurity  . Worried About Charity fundraiser in the Last Year: Never true  . Ran Out of Food in the Last Year: Never true  Transportation Needs: No Transportation Needs  . Lack of Transportation (Medical): No  . Lack of Transportation (Non-Medical): No  Physical Activity: Sufficiently Active  . Days of Exercise per Week: 4 days  . Minutes of Exercise per Session: 60 min  Stress: No Stress Concern Present  . Feeling of Stress : Not at all  Social Connections: Moderately Integrated  . Frequency of Communication with Friends and Family: Three times a week  . Frequency of Social Gatherings with Friends and Family: Three times a week  . Attends Religious Services: More than 4 times per year  . Active Member of Clubs or Organizations: No  . Attends Archivist Meetings: Never  . Marital Status: Married    Her Allergies Are:  Allergies  Allergen Reactions  . Nutritional Supplements     [place] >> UNSPECIFIED REACTION  Severity from [place]  . Latex Swelling and Rash    SWELLING REACTION UNSPECIFIED   :   Her Current Medications Are:  Outpatient Encounter Medications as of 05/17/2020   Medication Sig  . albuterol (PROVENTIL) (2.5 MG/3ML) 0.083% nebulizer solution Take 3 mLs (2.5 mg total) by nebulization every 6 (six) hours as needed for wheezing or shortness of breath.  [name] albuterol (VENTOLIN HFA) 108 (90 Base) MCG/ACT inhaler Inhale 2 puffs into the lungs every 6 (six) hours as needed for wheezing or shortness of breath.  . allopurinol (ZYLOPRIM) 100 MG tablet Take 100 mg by mouth daily.  [name] amLODipine (NORVASC) 10 MG tablet Take 10 mg by mouth daily.  . calcitRIOL (ROCALTROL)  0.25 MCG capsule Take 0.25 mcg by mouth daily.  . isosorbide-hydrALAZINE (BIDIL) 20-37.5 MG tablet Take by mouth 2 (two) times daily.  . metoprolol succinate (TOPROL-XL) 100 MG 24 hr tablet Take 50 mg by mouth daily. Take with or immediately following a meal.  . montelukast (SINGULAIR) 10 MG tablet Take 1 tablet (10 mg total) by mouth at bedtime.   No facility-administered encounter medications on file as of 05/17/2020.  :  Review of Systems:  Out of a complete 14 point review of systems, all are reviewed and negative with the exception of these symptoms as listed below: Review of Systems  Neurological:       Here for sleep consult. Prior study was in 2015, pt never started cpap. Here to reassess.  Epworth Sleepiness Scale 0= would never doze 1= slight chance of dozing 2= moderate chance of dozing 3= high chance of dozing  Sitting and reading:2 Watching TV:3 Sitting inactive in a public place (ex. Theater or meeting):2 As a passenger in a car for an hour without a break:3 Lying down to rest in the afternoon:3 Sitting and talking to someone:1 Sitting quietly after lunch (no alcohol):1 In a car, while stopped in traffic:1 Total:16     Objective:  Neurological Exam  Physical Exam Physical Examination:   Vitals:   05/17/20 1107  BP: 120/86  Pulse: (!) 51  SpO2: 98%    General Examination: The patient is a very pleasant 60 y.o. female in no acute distress. She appears  well-developed and well-nourished and well groomed.   HEENT: Normocephalic, atraumatic, pupils are equal, round and reactive to light, extraocular tracking is good without limitation to gaze excursion or nystagmus noted. Hearing is grossly intact. Face is symmetric with normal facial animation. Speech is clear with no dysarthria noted. There is no hypophonia. There is no lip, neck/head, jaw or voice tremor. Neck is supple with full range of passive and active motion. There are no carotid bruits on auscultation. Oropharynx exam reveals: mild mouth dryness, adequate dental hygiene with several missing teeth.  She has partials for both upper and lower jaws but has not been able to wear them.  She has moderate airway crowding secondary to small airway entry, redundant soft palate.  Tonsils about 1+.  Mallampati class II.  Neck circumference of 15-1/2 inches.  Tongue protrudes centrally in palate elevates symmetrically.    Chest: Clear to auscultation without wheezing, rhonchi or crackles noted.  Heart: S1+S2+0, regular and normal without murmurs, rubs or gallops noted.   Abdomen: Soft, non-tender and non-distended with normal bowel sounds appreciated on auscultation.  Extremities: There is trace pitting edema in the distal lower extremities bilaterally.   Skin: Warm and dry without trophic changes noted.   Musculoskeletal: exam reveals some left knee discomfort.    Neurologically:  Mental status: The patient is awake, alert and oriented in all 4 spheres. Her immediate and remote memory, attention, language skills and fund of knowledge are appropriate. There is no evidence of aphasia, agnosia, apraxia or anomia. Speech is clear with normal prosody and enunciation. Thought process is linear. Mood is normal and affect is normal.  Cranial nerves II - XII are as described above under HEENT exam.  Motor exam: Normal bulk, strength and tone is noted. There is no tremor, Romberg is negative. Fine motor skills  and coordination: grossly intact.  Cerebellar testing: No dysmetria or intention tremor. There is no truncal or gait ataxia.  Sensory exam: intact to light touch in the  upper and lower extremities.  Gait, station and balance: She stands easily. No veering to one side is noted. No leaning to one side is noted. Posture is age-appropriate and stance is narrow based. Gait shows normal stride length and normal pace. No problems turning are noted.   Assessment and Plan:   In summary, Yuliya Nova is a very pleasant 60 y.o.-year old female with an underlying medical history of asthma, palpitations, hypertension, hyperlipidemia, gout, reflux disease, diabetes, chronic kidney disease, depression, anxiety, arthritis, anemia, back pain, bradycardia, and morbid obesity with a BMI of over 40, whose history and physical exam are concerning for obstructive sleep apnea (OSA).  She was previously diagnosed with moderate obstructive sleep apnea but did not pursue CPAP therapy at the time. I had a long chat with the patient about my findings and the diagnosis of OSA, its prognosis and treatment options. We talked about medical treatments, surgical interventions and non-pharmacological approaches. I explained in particular the risks and ramifications of untreated moderate to severe OSA, especially with respect to developing cardiovascular disease down the Road, including congestive heart failure, difficult to treat hypertension, cardiac arrhythmias, or stroke. Even type 2 diabetes has, in part, been linked to untreated OSA. Symptoms of untreated OSA include daytime sleepiness, memory problems, mood irritability and mood disorder such as depression and anxiety, lack of energy, as well as recurrent headaches, especially morning headaches. We talked about trying to maintain a healthy lifestyle in general, as well as the importance of weight control. We also talked about the importance of good sleep hygiene. I recommended  the following at this time: sleep study.  I explained the sleep test procedure to the patient and also outlined possible surgical and non-surgical treatment options of OSA, including the use of a custom-made dental device or upper airway surgical options. I also explained the CPAP treatment option to the patient, who indicated that she would be willing to try CPAP if the need arises. I explained the importance of being compliant with PAP treatment, not only for insurance purposes but primarily to improve Her symptoms, and for the patient's long term health benefit, including to reduce Her cardiovascular risks. I answered all her questions today and the patient was in agreement. I plan to see her back after the sleep study is completed and encouraged her to call with any interim questions, concerns, problems or updates.   Thank you very much for allowing me to participate in the care of this nice patient. If I can be of any further assistance to you please do not hesitate to call me at [phone].  Sincerely,   Star Age, MD, PhD

## 2020-05-17 NOTE — Patient Instructions (Signed)
Visit Information  Margaret Aguilar was given information about Medicaid Managed Care team care coordination services as a part of their Healthy [facility] Medicaid benefit. Margaret Aguilar verbally consented to engagement with the [facility] Managed Care team.   For questions related to your Healthy [facility] health plan, please call: [phone] or visit the homepage here: [email or web address]  If you would like to schedule transportation through your Healthy [facility] plan, please call the following number at least 2 days in advance of your appointment: [phone]  Margaret Aguilar - following are the goals we discussed in your visit today:  Goals Addressed              This Visit's Progress   .  COMPLETED: "I need to check my blood pressure more often" (pt-stated)        CARE PLAN ENTRY Medicaid Managed Care (see longtitudinal plan of care for additional care plan information)  Objective:  . Last practice recorded BP readings:  BP Readings from Last 3 Encounters:  12/22/19 (!) 142/76  12/14/19 (!) 148/90  11/23/19 110/67    Current Barriers:  [name] Knowledge Deficits related to basic understanding of hypertension pathophysiology and self care management. Patient reports checking her BP occasionally at home. Last reading was 136/90. Patient states that she needs to taker her medication on time.  Case Manager Clinical Goal(s):  [name] Over the next 60 days, patient will verbalize understanding of plan for hypertension management . Over the next 60 days, patient will attend all scheduled medical appointments: PCP 03/07/20 . Over the next 30 days, patient will demonstrate improved adherence to prescribed treatment plan for hypertension as evidenced by taking all medications as prescribed, monitoring and recording blood pressure as directed, adhering to low sodium/DASH diet. As seen in the notes, [name], NP would like BP readings <130/80 . Over the next  30 days, patient will continue to increase her activity/exercise-Patient is joining the [facility]  Interventions:  . Evaluation of current treatment plan related to hypertension self management and patient's adherence to plan as established by provider. . Reviewed medications with patient and discussed importance of compliance . Discussed plans with patient for ongoing care management follow up and provided patient with direct contact information for care management team . Advised patient, providing education and rationale, to monitor blood pressure daily and record, calling PCP for findings outside established parameters. RNCM instructed patient to check BP one hour after taking prescribed BP medications, record and take readings to next PCP visit . Reviewed scheduled/upcoming provider appointments including: PCP 03/07/20  Patient Self Care Activities:  . Self administers medications as prescribed . Attends all scheduled provider appointments . Calls provider office for new concerns, questions, or BP outside discussed parameters . Checks BP and records as discussed . Follows a low sodium diet/DASH diet . Patient reports walking 2 miles every other day-Pt will increase walking as tolerated  Please see past updates related to this goal by clicking on the "Past Updates" button in the selected goal       .  Track and Manage My Blood Pressure-Hypertension        Timeframe:  Long-Range Goal Priority:  High Start Date:  02/28/20                           Expected End Date: 07/17/20  Follow Up Date 07/17/20   -check BP as directed, reporting abnormal readings to my PCP  -Self administers medications as prescribed  -Attends all scheduled provider appointments  -Calls pharmacy for medication refills  -Calls provider office for new concerns or questions  -call to reschedule appointment with Dr. [name]  -establish [employer] membership and begin to increase activity  -keep albuterol  inhaler close by at all times   Why is this important?    You won't feel high blood pressure, but it can still hurt your blood vessels.   High blood pressure can cause heart or kidney problems. It can also cause a stroke.   Making lifestyle changes like losing a little weight or eating less salt will help.   Checking your blood pressure at home and at different times of the day can help to control blood pressure.   If the doctor prescribes medicine remember to take it the way the doctor ordered.   Call the office if you cannot afford the medicine or if there are questions about it.     Notes:        Please see education materials related to diet provided by MyChart link.    Heart-Healthy Eating Plan Heart-healthy meal planning includes:  Eating less unhealthy fats.  Eating more healthy fats.  Making other changes in your diet. Talk with your doctor or a diet specialist (dietitian) to create an eating plan that is right for you. What are tips for following this plan? Cooking Avoid frying your food. Try to bake, boil, grill, or broil it instead. You can also reduce fat by:  Removing the skin from poultry.  Removing all visible fats from meats.  Steaming vegetables in water or broth. Meal planning  At meals, divide your plate into four equal parts: ? Fill one-half of your plate with vegetables and green salads. ? Fill one-fourth of your plate with whole grains. ? Fill one-fourth of your plate with lean protein foods.  Eat 4-5 servings of vegetables per day. A serving of vegetables is: ? 1 cup of raw or cooked vegetables. ? 2 cups of raw leafy greens.  Eat 4-5 servings of fruit per day. A serving of fruit is: ? 1 medium whole fruit. ?  cup of dried fruit. ?  cup of fresh, frozen, or canned fruit. ?  cup of 100% fruit juice.  Eat more foods that have soluble fiber. These are apples, broccoli, carrots, beans, peas, and barley. Try to get 20-30 g of fiber per  day.  Eat 4-5 servings of nuts, legumes, and seeds per week: ? 1 serving of dried beans or legumes equals  cup after being cooked. ? 1 serving of nuts is  cup. ? 1 serving of seeds equals 1 tablespoon.   General information  Eat more home-cooked food. Eat less restaurant, buffet, and fast food.  Limit or avoid alcohol.  Limit foods that are high in starch and sugar.  Avoid fried foods.  Lose weight if you are overweight.  Keep track of how much salt (sodium) you eat. This is important if you have high blood pressure. Ask your doctor to tell you more about this.  Try to add vegetarian meals each week. Fats  Choose healthy fats. These include olive oil and canola oil, flaxseeds, walnuts, almonds, and seeds.  Eat more omega-3 fats. These include salmon, mackerel, sardines, tuna, flaxseed oil, and ground flaxseeds. Try to eat fish at least 2 times each week.  Check food labels.  Avoid foods with trans fats or high amounts of saturated fat.  Limit saturated fats. ? These are often found in animal products, such as meats, butter, and cream. ? These are also found in plant foods, such as palm oil, palm kernel oil, and coconut oil.  Avoid foods with partially hydrogenated oils in them. These have trans fats. Examples are stick margarine, some tub margarines, cookies, crackers, and other baked goods. What foods can I eat? Fruits All fresh, canned (in natural juice), or frozen fruits. Vegetables Fresh or frozen vegetables (raw, steamed, roasted, or grilled). Green salads. Grains Most grains. Choose whole wheat and whole grains most of the time. Rice and pasta, including brown rice and pastas made with whole wheat. Meats and other proteins Lean, well-trimmed beef, veal, pork, and lamb. Chicken and [place] without skin. All fish and shellfish. Wild duck, rabbit, pheasant, and venison. Egg whites or low-cholesterol egg substitutes. Dried beans, peas, lentils, and tofu. Seeds and most  nuts. Dairy Low-fat or nonfat cheeses, including ricotta and mozzarella. Skim or 1% milk that is liquid, powdered, or evaporated. Buttermilk that is made with low-fat milk. Nonfat or low-fat yogurt. Fats and oils Non-hydrogenated (trans-free) margarines. Vegetable oils, including soybean, sesame, sunflower, olive, peanut, safflower, corn, canola, and cottonseed. Salad dressings or mayonnaise made with a vegetable oil. Beverages Mineral water. Coffee and tea. Diet carbonated beverages. Sweets and desserts Sherbet, gelatin, and fruit ice. Small amounts of dark chocolate. Limit all sweets and desserts. Seasonings and condiments All seasonings and condiments. The items listed above may not be a complete list of foods and drinks you can eat. Contact a dietitian for more options. What foods should I avoid? Fruits Canned fruit in heavy syrup. Fruit in cream or butter sauce. Fried fruit. Limit coconut. Vegetables Vegetables cooked in cheese, cream, or butter sauce. Fried vegetables. Grains Breads that are made with saturated or trans fats, oils, or whole milk. Croissants. Sweet rolls. Donuts. High-fat crackers, such as cheese crackers. Meats and other proteins Fatty meats, such as hot dogs, ribs, sausage, bacon, rib-eye roast or steak. High-fat deli meats, such as salami and bologna. Caviar. Domestic duck and goose. Organ meats, such as liver. Dairy Cream, sour cream, cream cheese, and creamed cottage cheese. Whole-milk cheeses. Whole or 2% milk that is liquid, evaporated, or condensed. Whole buttermilk. Cream sauce or high-fat cheese sauce. Yogurt that is made from whole milk. Fats and oils Meat fat, or shortening. Cocoa butter, hydrogenated oils, palm oil, coconut oil, palm kernel oil. Solid fats and shortenings, including bacon fat, salt pork, lard, and butter. Nondairy cream substitutes. Salad dressings with cheese or sour cream. Beverages Regular sodas and juice drinks with added  sugar. Sweets and desserts Frosting. Pudding. Cookies. Cakes. Pies. Milk chocolate or white chocolate. Buttered syrups. Full-fat ice cream or ice cream drinks. The items listed above may not be a complete list of foods and drinks to avoid. Contact a dietitian for more information. Summary  Heart-healthy meal planning includes eating less unhealthy fats, eating more healthy fats, and making other changes in your diet.  Eat a balanced diet. This includes fruits and vegetables, low-fat or nonfat dairy, lean protein, nuts and legumes, whole grains, and heart-healthy oils and fats. This information is not intended to replace advice given to you by your health care provider. Make sure you discuss any questions you have with your health care provider. Document Revised: 06/04/2017 Document Reviewed: 05/08/2017 Elsevier Patient Education  2021 [place].  Diabetes Mellitus and Nutrition, Adult  When you have diabetes, or diabetes mellitus, it is very important to have healthy eating habits because your blood sugar (glucose) levels are greatly affected by what you eat and drink. Eating healthy foods in the right amounts, at about the same times every day, can help you:  Control your blood glucose.  Lower your risk of heart disease.  Improve your blood pressure.  Reach or maintain a healthy weight. What can affect my meal plan? Every person with diabetes is different, and each person has different needs for a meal plan. Your health care provider may recommend that you work with a dietitian to make a meal plan that is best for you. Your meal plan may vary depending on factors such as:  The calories you need.  The medicines you take.  Your weight.  Your blood glucose, blood pressure, and cholesterol levels.  Your activity level.  Other health conditions you have, such as heart or kidney disease. How do carbohydrates affect me? Carbohydrates, also called carbs, affect your blood glucose  level more than any other type of food. Eating carbs naturally raises the amount of glucose in your blood. Carb counting is a method for keeping track of how many carbs you eat. Counting carbs is important to keep your blood glucose at a healthy level, especially if you use insulin or take certain oral diabetes medicines. It is important to know how many carbs you can safely have in each meal. This is different for every person. Your dietitian can help you calculate how many carbs you should have at each meal and for each snack. How does alcohol affect me? Alcohol can cause a sudden decrease in blood glucose (hypoglycemia), especially if you use insulin or take certain oral diabetes medicines. Hypoglycemia can be a life-threatening condition. Symptoms of hypoglycemia, such as sleepiness, dizziness, and confusion, are similar to symptoms of having too much alcohol.  Do not drink alcohol if: ? Your health care provider tells you not to drink. ? You are pregnant, may be pregnant, or are planning to become pregnant.  If you drink alcohol: ? Do not drink on an empty stomach. ? Limit how much you use to:  0-1 drink a day for women.  0-2 drinks a day for men. ? Be aware of how much alcohol is in your drink. In the [place] one drink equals one 12 oz bottle of beer (355 mL), one 5 oz glass of wine (148 mL), or one 1 oz glass of hard liquor (44 mL). ? Keep yourself hydrated with water, diet soda, or unsweetened iced tea.  Keep in mind that regular soda, juice, and other mixers may contain a lot of sugar and must be counted as carbs. What are tips for following this plan? Reading food labels  Start by checking the serving size on the "Nutrition Facts" label of packaged foods and drinks. The amount of calories, carbs, fats, and other nutrients listed on the label is based on one serving of the item. Many items contain more than one serving per package.  Check the total grams (g) of carbs in one serving.  You can calculate the number of servings of carbs in one serving by dividing the total carbs by 15. For example, if a food has 30 g of total carbs per serving, it would be equal to 2 servings of carbs.  Check the number of grams (g) of saturated fats and trans fats in one serving. Choose foods that have a low amount or  none of these fats.  Check the number of milligrams (mg) of salt (sodium) in one serving. Most people should limit total sodium intake to less than 2,300 mg per day.  Always check the nutrition information of foods labeled as "low-fat" or "nonfat." These foods may be higher in added sugar or refined carbs and should be avoided.  Talk to your dietitian to identify your daily goals for nutrients listed on the label. Shopping  Avoid buying canned, pre-made, or processed foods. These foods tend to be high in fat, sodium, and added sugar.  Shop around the outside edge of the grocery store. This is where you will most often find fresh fruits and vegetables, bulk grains, fresh meats, and fresh dairy. Cooking  Use low-heat cooking methods, such as baking, instead of high-heat cooking methods like deep frying.  Cook using healthy oils, such as olive, canola, or sunflower oil.  Avoid cooking with butter, cream, or high-fat meats. Meal planning  Eat meals and snacks regularly, preferably at the same times every day. Avoid going long periods of time without eating.  Eat foods that are high in fiber, such as fresh fruits, vegetables, beans, and whole grains. Talk with your dietitian about how many servings of carbs you can eat at each meal.  Eat 4-6 oz (112-168 g) of lean protein each day, such as lean meat, chicken, fish, eggs, or tofu. One ounce (oz) of lean protein is equal to: ? 1 oz (28 g) of meat, chicken, or fish. ? 1 egg. ?  cup (62 g) of tofu.  Eat some foods each day that contain healthy fats, such as avocado, nuts, seeds, and fish.   What foods should I  eat? Fruits Berries. Apples. Oranges. Peaches. Apricots. Plums. Grapes. Mango. Papaya. Pomegranate. Kiwi. Cherries. Vegetables Lettuce. Spinach. Leafy greens, including kale, chard, collard greens, and mustard greens. Beets. Cauliflower. Cabbage. Broccoli. Carrots. Green beans. Tomatoes. Peppers. Onions. Cucumbers. Brussels sprouts. Grains Whole grains, such as whole-wheat or whole-grain bread, crackers, tortillas, cereal, and pasta. Unsweetened oatmeal. Quinoa. Brown or wild rice. Meats and other proteins Seafood. Poultry without skin. Lean cuts of poultry and beef. Tofu. Nuts. Seeds. Dairy Low-fat or fat-free dairy products such as milk, yogurt, and cheese. The items listed above may not be a complete list of foods and beverages you can eat. Contact a dietitian for more information. What foods should I avoid? Fruits Fruits canned with syrup. Vegetables Canned vegetables. Frozen vegetables with butter or cream sauce. Grains Refined white flour and flour products such as bread, pasta, snack foods, and cereals. Avoid all processed foods. Meats and other proteins Fatty cuts of meat. Poultry with skin. Breaded or fried meats. Processed meat. Avoid saturated fats. Dairy Full-fat yogurt, cheese, or milk. Beverages Sweetened drinks, such as soda or iced tea. The items listed above may not be a complete list of foods and beverages you should avoid. Contact a dietitian for more information. Questions to ask a health care provider  Do I need to meet with a diabetes educator?  Do I need to meet with a dietitian?  What number can I call if I have questions?  When are the best times to check my blood glucose? Where to find more information:  American Diabetes Association: diabetes.org  Academy of Nutrition and Dietetics: www.eatright.[employer] of Diabetes and Digestive and Kidney Diseases: [employer]  Association of Diabetes Care and Education Specialists:  www.diabeteseducator.org Summary  It is important to have healthy eating habits because your blood  sugar (glucose) levels are greatly affected by what you eat and drink.  A healthy meal plan will help you control your blood glucose and maintain a healthy lifestyle.  Your health care provider may recommend that you work with a dietitian to make a meal plan that is best for you.  Keep in mind that carbohydrates (carbs) and alcohol have immediate effects on your blood glucose levels. It is important to count carbs and to use alcohol carefully. This information is not intended to replace advice given to you by your health care provider. Make sure you discuss any questions you have with your health care provider. Document Revised: 03/08/2019 Document Reviewed: 03/08/2019 Elsevier Patient Education  2021 [place].   Patient verbalizes understanding of instructions provided today.   Telephone follow up appointment with Managed Medicaid care management team member scheduled for:07/17/20 @ 1p  Melissa Montane, RN  Following is a copy of your plan of care:  Patient Care Plan: General Plan of Care (Adult)    Problem Identified: Hypertension (Hypertension)     Long-Range Goal: Managing Hypertension   Start Date: 05/03/2020  Expected End Date: 07/05/2020  Recent Progress: On track  Priority: High  Note:   Current Barriers:  . Chronic Disease Management support and education needs related to Hypertension. Margaret Aguilar was seen by her PCP on 05/02/20 with a BP of 200/100, she had forgotten to take her BP medication. She reports that she has taken her medicine today, but she is unable to check her BP at this time. She also reports headache, congestion and fatigue with a recent exposure to COVID-19. Patient had a negative home test on 05/03/20. Margaret Aguilar had a consult for sleep disorder today and is waiting to hear back for a plan to address sleep apnea (surgical or CPAP).  BP today 130/86, Margaret Aguilar is  very happy with this reading.   Nurse Case Manager Clinical Goal(s):  [name] Over the next 30 days, patient will verbalize understanding of plan for hypertension . Over the next 14 days, patient will meet with RN Care Manager to address possible COVID-19-Met-home test was negative . Over the next 30 days, patient will demonstrate improved adherence to prescribed treatment plan for HTN as evidenced by taking BP medications as directed every day  and attending all scheduled appointments . Over the next 30 days, patient will call to reschedule recently missed appointment with Dr. [name] . Over the next 30 days, patient will verbalize understanding for plan for sleep apnea  Interventions:  . Inter-disciplinary care team collaboration (see longitudinal plan of care) . Evaluation of current treatment plan related to HTN and patient's adherence to plan as established by provider. . Advised patient to be tested for COVID, quarentine, hand washing, social distancing, and call PCP if symptoms worsen . Provided education to patient re: COVID and HTN . Reviewed medications with patient and discussed the importance of taking as prescribed . Provide education on diabetic diet via MyChart . Discussed importance of keeping albuterol inhaler with her at all times, especially when she goes outside  Patient Goals/Self-Care Activities Over the next 30 days, patient will: -check BP as directed, reporting abnormal readings to my PCP  -Self administers medications as prescribed  -Attends all scheduled provider appointments  -Calls pharmacy for medication refills  -Calls provider office for new concerns or questions  -call to reschedule appointment with Dr. [name]  -establish [employer] membership and begin to increase activity  -keep albuterol inhaler close by at  all times  Follow Up Plan: Telephone follow up appointment with Managed Medicaid care management team member scheduled for:07/17/20 @ 1pm

## 2020-05-17 NOTE — Patient Instructions (Signed)

## 2020-05-23 ENCOUNTER — Other Ambulatory Visit: Payer: Self-pay

## 2020-05-23 NOTE — Patient Outreach (Signed)
Medicaid Managed Care Social Work Note  05/23/2020 Name:  Margaret Aguilar MRN:  [id number] DOB:  July 12, 1960  Margaret Aguilar is an 60 y.o. year old female who is a primary patient of [name], NP.  The Medicaid Managed Care Coordination team was consulted for assistance with:  Community Resources   Margaret Aguilar was given information about Medicaid Managed CareCoordination services today. Margaret Aguilar agreed to services and verbal consent obtained.  Engaged with patient  for by telephone forfollow up visit in response to referral for case management and/or care coordination services.   Assessments/Interventions:  Review of past medical history, allergies, medications, health status, including review of consultants reports, laboratory and other test data, was performed as part of comprehensive evaluation and provision of chronic care management services.  SDOH: (Social Determinant of Health) assessments and interventions performed:   Patient stated she has not made another appointment to continue therapy. Patient stated she has not applied again for disability. Patient states she does not know if she wants to apply for disability first and contact legal aid or contact legal aid first.  Advanced Directives Status:  Not addressed in this encounter.  Care Plan                 Allergies  Allergen Reactions  . Nutritional Supplements     Walnut >> UNSPECIFIED REACTION  Severity from [place]  . Latex Swelling and Rash    SWELLING REACTION UNSPECIFIED     Medications Reviewed Today    Reviewed by Melissa Montane, RN (Registered Nurse) on 05/17/20 at 36  Med List Status: <None>  Medication Order Taking? Sig Documenting Provider Last Dose Status Informant  albuterol (PROVENTIL) (2.5 MG/3ML) 0.083% nebulizer solution [id number] Yes Take 3 mLs (2.5 mg total) by nebulization every 6 (six) hours as needed for wheezing or shortness of breath. [name], NP Taking Active    albuterol (VENTOLIN HFA) 108 (90 Base) MCG/ACT inhaler [id number] Yes Inhale 2 puffs into the lungs every 6 (six) hours as needed for wheezing or shortness of breath. [name], NP Taking Active   allopurinol (ZYLOPRIM) 100 MG tablet [id number] Yes Take 100 mg by mouth daily. [provider] Taking Active Self  amLODipine (NORVASC) 10 MG tablet [id number] Yes Take 10 mg by mouth daily. [provider] Taking Active   calcitRIOL (ROCALTROL) 0.25 MCG capsule [id number] Yes Take 0.25 mcg by mouth daily. [provider] Taking Active   isosorbide-hydrALAZINE (BIDIL) 20-37.5 MG tablet [id number] Yes Take by mouth 2 (two) times daily. [provider] Taking Active   metoprolol succinate (TOPROL-XL) 100 MG 24 hr tablet [id number] Yes Take 50 mg by mouth daily. Take with or immediately following a meal. [provider] Taking Active Self  montelukast (SINGULAIR) 10 MG tablet [id number] Yes Take 1 tablet (10 mg total) by mouth at bedtime. [name], NP Taking Active           Patient Active Problem List   Diagnosis Date Noted  . Essential hypertension 12/14/2019  . Mild intermittent asthma without complication 68/02/5725  . Vitamin D deficiency 11/24/2019  . Bradycardia 08/16/2018  . Gout attack 08/09/2018  . Acute on chronic renal failure ([place]) 08/08/2018  . CKD (chronic kidney disease) stage 4, GFR 15-29 ml/min (HCC) 01/09/2017  . Spondylosis of lumbosacral region 10/09/2016  . Type 2 diabetes mellitus, without long-term current use of insulin ([place]) 09/18/2016  . IDA (iron deficiency anemia) 12/14/2013  . Guaiac +  stool 12/14/2013  . Hypertension 09/28/2013  . Diabetes ([place]) 09/28/2013  . Renal insufficiency 09/28/2013  . Morbid obesity ([place]) 09/28/2013  . Obstructive sleep apnea on CPAP 06/05/2011  . Hyperlipidemia 06/05/2011  . Gastroesophageal reflux disease 06/05/2011  . Arthritis 06/05/2011    Conditions to be addressed/monitored  per PCP order:  disablitiy and therapy  There are no care plans that you recently modified to display for this patient.   Follow up:  Patient agrees to Care Plan and Follow-up.  Plan: The Managed Medicaid care management team will reach out to the patient again over the next 45 days.  Date/time of next scheduled Social Work care management/care coordination outreach:  07/11/20  [name], [place], [place]  High Risk Managed Medicaid Team

## 2020-05-23 NOTE — Patient Instructions (Signed)
Visit Information  Ms. Vanloan was given information about Medicaid Managed Care team care coordination services as a part of their Healthy [facility] Medicaid benefit. Obie Silos Weinmann verbally consented to engagement with the [facility] Managed Care team.   For questions related to your Healthy [facility] health plan, please call: [phone] or visit the homepage here: [email or web address]  If you would like to schedule transportation through your Healthy [facility] plan, please call the following number at least 2 days in advance of your appointment: [phone]  Ms. Grounds - following are the goals we discussed in your visit today:  Goals Addressed              This Visit's Progress   .  COMPLETED: "I would like to talk to someone about counseling" (pt-stated)        [place] Medicaid Managed Care (see longitudinal plan of care for additional care plan information)  Current Barriers:  . Care Coordination needs related to counseling resources available in a patient with stated emotional health needs  Nurse Case Manager Clinical Goal(s):  [name] Over the next 14 days, patient will verbalize understanding of plan for counseling support . Over the next 7 days, patient will work with CM clinical social worker to address counseling support needs  Interventions:  . Inter-disciplinary care team collaboration (see longitudinal plan of care) . Evaluation of current treatment plan related to emotional health and patient's adherence to plan as established by provider. Patient states that she desires assistance with mental/emotional health and is not currently connected to a therapist.  . Collaborated with LCSW regarding emotional/mental health needs.  LCSW will collaborate with BSW to schedule patient with an outpatient therapy provider. Referral sent to BSW to schedule with [place] - [phone]. BSW scheduled an appointment for  [place] for 02/22/20. The appointment will be virtual.  . Update 02/28/2020:  Patient stated she attended her initial therapy session on  02/22/2020. She stated the session went well and she is looking forward to beginning therapy. . Discussed plans with patient for ongoing care management follow up and provided patient with direct contact information for care management team . Update 05/23/20: Patient stated she has not made her second appointment for therapy yet. Patient stated she does want to continue with therapy and will make the appointment.  Patient Self Care Activities:  . Patient verbalizes understanding of plan to work with LCSW for mental/emotional health support .  Patient stated to LCSW that she is still very interested in participating in outpatient therapy and is waiting to be scheduled with a provider. . Unable to independently procure long term counseling/therapy support  -  Patient stated to LCSW that she is waiting to be scheduled with an outpatient therapist.  . Patient will contact her pharmacy for refills on Metoprolol Succinate and Allopurinol.  Please see past updates related to this goal by clicking on the "Past Updates" button in the selected goal    12/05/2019 Update: Patient verbalized need for outpatient therapy. She discussed her situation at home (she is caring for her nephew's three young children: 58 yo and 79 yo twins, and she has been caring for them for the past 2 years due to CPS involvement). 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  She discussed what she feels led up to her current health issues (her sister passed away 5 years  ago when she was 60 years old. Patient stated she didn't really deal with her death but she also started gaining weight.) Patient discussed other life events that she feels also contributed to her current feelings of  depression. She acknowledged that she needs help to work through her issues. LCSW explained that a referral will be made for her to begin therapy at a local agency. Patient verbalized understanding and was agreeable.  12/12/19: RN CM collaborated with LCSW regarding Outpatient therapy. Referral made on 12/05/19.    [name]  "Should I reapply for disability" (pt-stated)        [place] (see longitudinal plan of care for additional care plan information)  Current Barriers:  [name] Knowledge Deficits related to applying for Disability benefits . 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   Nurse Case Manager Clinical Goal(s):  [name] Over the next 30 days, patient will verbalize understanding of plan for reapplying for disabilty benefits . Over the next 30 days, patient will work with [place] Team to address needs related to financial assistance  Interventions:  . Inter-disciplinary care team collaboration (see longitudinal plan of care) . Advised patient to reapply for disability benefits . Collaborated with MM Care Team regarding disability process . Update 02/28/2020:  Patient stated she is still interested in applying for disability and requests assistance. LCSW will refer to Care Guide Team for assistance. . BSW provided patient with the phone number for Legal Aid [phone]. BSW informed patient once she has reapplied, contact Legal Aid. [name] Update 05/23/20: Patient stated she has not reapplied for disability yet, she is not sure if she wants to reapply and then contact legal aid or contact legal aid first. BSW informed patient she should reapply and then contact legal aid.  Patient Self Care Activities:  . Patient will apply for disability benefits. LCSW note: Patient stated that when she applied 2 years ago, she was approved for back payments but was not approved for ongoing payments because of her husband's income. She stated she will discuss reapplying with her PCP for advisement.   Please  see past updates related to this goal by clicking on the "Past Updates" button in the selected goal             Social Worker will follow up in 45 days.   Ethelda Chick  Following is a copy of your plan of care:  Patient Care Plan: General Plan of Care (Adult)    Problem Identified: Hypertension (Hypertension)     Long-Range Goal: Managing Hypertension   Start Date: 05/03/2020  Expected End Date: 07/05/2020  Recent Progress: On track  Priority: High  Note:   Current Barriers:  . Chronic Disease Management support and education needs related to Hypertension. Ms Duca was seen by her PCP on 05/02/20 with a BP of 200/100, she had forgotten to take her BP medication. She reports that she has taken her medicine today, but she is unable to check her BP at this time. She also reports headache, congestion and fatigue with a recent exposure to COVID-19. Patient had a negative home test on 05/03/20. Ms [place] had a consult for sleep disorder today and is waiting to hear back for a plan to address sleep apnea (surgical or CPAP).  BP today 130/86, Ms Heidel is very happy with this reading.   Nurse Case Manager Clinical Goal(s):  [name] Over the next 30 days, patient will verbalize understanding of plan for hypertension . Over the next 14 days,  patient will meet with RN Care Manager to address possible COVID-19-Met-home test was negative . Over the next 30 days, patient will demonstrate improved adherence to prescribed treatment plan for HTN as evidenced by taking BP medications as directed every day  and attending all scheduled appointments . Over the next 30 days, patient will call to reschedule recently missed appointment with Dr. [name] . Over the next 30 days, patient will verbalize understanding for plan for sleep apnea  Interventions:  . Inter-disciplinary care team collaboration (see longitudinal plan of care) . Evaluation of current treatment plan related to HTN and patient's adherence to  plan as established by provider. . Advised patient to be tested for COVID, quarentine, hand washing, social distancing, and call PCP if symptoms worsen . Provided education to patient re: COVID and HTN . Reviewed medications with patient and discussed the importance of taking as prescribed . Provide education on diabetic diet via MyChart . Discussed importance of keeping albuterol inhaler with her at all times, especially when she goes outside  Patient Goals/Self-Care Activities Over the next 30 days, patient will: -check BP as directed, reporting abnormal readings to my PCP  -Self administers medications as prescribed  -Attends all scheduled provider appointments  -Calls pharmacy for medication refills  -Calls provider office for new concerns or questions  -call to reschedule appointment with Dr. [name]  -establish [employer] membership and begin to increase activity  -keep albuterol inhaler close by at all times  Follow Up Plan: Telephone follow up appointment with Managed Medicaid care management team member scheduled for:07/17/20 @ 1pm

## 2020-05-24 ENCOUNTER — Ambulatory Visit: Payer: Self-pay

## 2020-05-28 ENCOUNTER — Telehealth: Payer: Self-pay

## 2020-05-28 NOTE — Telephone Encounter (Signed)
LVM for pt to call me back to schedule sleep study  

## 2020-05-31 DIAGNOSIS — M109 Gout, unspecified: Secondary | ICD-10-CM | POA: Diagnosis not present

## 2020-05-31 DIAGNOSIS — D631 Anemia in chronic kidney disease: Secondary | ICD-10-CM | POA: Diagnosis not present

## 2020-05-31 DIAGNOSIS — N185 Chronic kidney disease, stage 5: Secondary | ICD-10-CM | POA: Diagnosis not present

## 2020-05-31 DIAGNOSIS — I120 Hypertensive chronic kidney disease with stage 5 chronic kidney disease or end stage renal disease: Secondary | ICD-10-CM | POA: Diagnosis not present

## 2020-05-31 DIAGNOSIS — N2581 Secondary hyperparathyroidism of renal origin: Secondary | ICD-10-CM | POA: Diagnosis not present

## 2020-06-07 ENCOUNTER — Telehealth: Payer: Self-pay

## 2020-06-07 NOTE — Telephone Encounter (Signed)
We have attempted to call the patient two times to schedule sleep study.  Patient has been unavailable at the phone numbers we have on file and has not returned our calls. If patient calls back we will schedule them for their sleep study.  

## 2020-06-14 ENCOUNTER — Ambulatory Visit: Payer: Self-pay | Admitting: Nurse Practitioner

## 2020-07-11 ENCOUNTER — Other Ambulatory Visit: Payer: Self-pay

## 2020-07-11 NOTE — Patient Instructions (Signed)
Visit Information  Ms. Coralee [place]  - as a part of your Medicaid benefit, you are eligible for care management and care coordination services at no cost or copay. I was unable to reach you by phone today but would be happy to help you with your health related needs. Please feel free to call me @ [phone].      [name], BSW, [place]  High Risk Managed Medicaid Team

## 2020-07-11 NOTE — Patient Outreach (Signed)
Care Coordination  07/11/2020  Amberlynn Tempesta [place] 1960/08/18 [id number]   Medicaid Managed Care   Unsuccessful Outreach Note  07/11/2020 Name: Baley Shands MRN: [id number] DOB: Aug 29, 1960  Referred by: [name], NP Reason for referral : High Risk Managed Medicaid (HR MM Unsuccessful Telephone Outreach)   An unsuccessful telephone outreach was attempted today. The patient was referred to the case management team for assistance with care management and care coordination.   Follow Up Plan: The patient has been provided with contact information for the care management team and has been advised to call with any health related questions or concerns.   [name], BSW, [place]  High Risk Managed Medicaid Team

## 2020-07-17 ENCOUNTER — Other Ambulatory Visit: Payer: Self-pay | Admitting: *Deleted

## 2020-07-17 NOTE — Patient Outreach (Signed)
Care Coordination  07/17/2020  Trinka Keshishyan Guard[place] April 27, 1960 [id number]    Medicaid Managed Care   Unsuccessful Outreach Note  07/17/2020 Name: Margaret Aguilar MRN: [id number] DOB: [birth year]  Referred by: [name], NP Reason for referral : High Risk Managed Medicaid (Unsuccessful RNCM follow up outreach)   An unsuccessful telephone outreach was attempted today. The patient was referred to the case management team for assistance with care management and care coordination.   Follow Up Plan: The care management team will reach out to the patient again over the next 7-14 days.   Lurena Joiner RN, BSN [place]  [employer] manager

## 2020-07-17 NOTE — Patient Instructions (Signed)
Visit Information  Ms. Margaret Aguilar  - as a part of your Medicaid benefit, you are eligible for care management and care coordination services at no cost or copay. I was unable to reach you by phone today but would be happy to help you with your health related needs. Please feel free to call me @ [phone].   A member of the Managed Medicaid care management team will reach out to you again over the next 7-14 days.   Lurena Joiner RN, BSN [place]  [employer] manager

## 2020-08-06 DIAGNOSIS — M109 Gout, unspecified: Secondary | ICD-10-CM | POA: Diagnosis not present

## 2020-08-06 DIAGNOSIS — D631 Anemia in chronic kidney disease: Secondary | ICD-10-CM | POA: Diagnosis not present

## 2020-08-06 DIAGNOSIS — N2581 Secondary hyperparathyroidism of renal origin: Secondary | ICD-10-CM | POA: Diagnosis not present

## 2020-08-06 DIAGNOSIS — I120 Hypertensive chronic kidney disease with stage 5 chronic kidney disease or end stage renal disease: Secondary | ICD-10-CM | POA: Diagnosis not present

## 2020-08-06 DIAGNOSIS — N185 Chronic kidney disease, stage 5: Secondary | ICD-10-CM | POA: Diagnosis not present

## 2020-08-06 DIAGNOSIS — N189 Chronic kidney disease, unspecified: Secondary | ICD-10-CM | POA: Diagnosis not present

## 2020-08-27 DIAGNOSIS — I129 Hypertensive chronic kidney disease with stage 1 through stage 4 chronic kidney disease, or unspecified chronic kidney disease: Secondary | ICD-10-CM | POA: Diagnosis not present

## 2020-10-09 DIAGNOSIS — Z6841 Body Mass Index (BMI) 40.0 and over, adult: Secondary | ICD-10-CM | POA: Diagnosis not present

## 2020-10-09 DIAGNOSIS — N2581 Secondary hyperparathyroidism of renal origin: Secondary | ICD-10-CM | POA: Diagnosis not present

## 2020-10-09 DIAGNOSIS — M109 Gout, unspecified: Secondary | ICD-10-CM | POA: Diagnosis not present

## 2020-10-09 DIAGNOSIS — D631 Anemia in chronic kidney disease: Secondary | ICD-10-CM | POA: Diagnosis not present

## 2020-10-09 DIAGNOSIS — E1129 Type 2 diabetes mellitus with other diabetic kidney complication: Secondary | ICD-10-CM | POA: Diagnosis not present

## 2020-10-09 DIAGNOSIS — I770 Arteriovenous fistula, acquired: Secondary | ICD-10-CM | POA: Diagnosis not present

## 2020-10-09 DIAGNOSIS — N185 Chronic kidney disease, stage 5: Secondary | ICD-10-CM | POA: Diagnosis not present

## 2020-10-09 DIAGNOSIS — I120 Hypertensive chronic kidney disease with stage 5 chronic kidney disease or end stage renal disease: Secondary | ICD-10-CM | POA: Diagnosis not present

## 2020-11-22 DIAGNOSIS — N2581 Secondary hyperparathyroidism of renal origin: Secondary | ICD-10-CM | POA: Diagnosis not present

## 2020-11-22 DIAGNOSIS — N189 Chronic kidney disease, unspecified: Secondary | ICD-10-CM | POA: Diagnosis not present

## 2020-11-22 DIAGNOSIS — I770 Arteriovenous fistula, acquired: Secondary | ICD-10-CM | POA: Diagnosis not present

## 2020-11-22 DIAGNOSIS — M109 Gout, unspecified: Secondary | ICD-10-CM | POA: Diagnosis not present

## 2020-11-22 DIAGNOSIS — Z6841 Body Mass Index (BMI) 40.0 and over, adult: Secondary | ICD-10-CM | POA: Diagnosis not present

## 2020-11-22 DIAGNOSIS — N185 Chronic kidney disease, stage 5: Secondary | ICD-10-CM | POA: Diagnosis not present

## 2020-11-22 DIAGNOSIS — I120 Hypertensive chronic kidney disease with stage 5 chronic kidney disease or end stage renal disease: Secondary | ICD-10-CM | POA: Diagnosis not present

## 2020-11-22 DIAGNOSIS — D631 Anemia in chronic kidney disease: Secondary | ICD-10-CM | POA: Diagnosis not present

## 2020-11-22 DIAGNOSIS — E1129 Type 2 diabetes mellitus with other diabetic kidney complication: Secondary | ICD-10-CM | POA: Diagnosis not present

## 2020-12-25 ENCOUNTER — Telehealth: Payer: Self-pay

## 2020-12-25 NOTE — Telephone Encounter (Signed)
Patient called and LVM requesting to schedule sleep study. Called patient back and no answer - LVM for patient to call back so we can verify insurance is still the same and get her scheduled.

## 2020-12-28 ENCOUNTER — Ambulatory Visit: Payer: Self-pay | Admitting: Nurse Practitioner

## 2021-01-01 ENCOUNTER — Ambulatory Visit: Payer: Self-pay | Admitting: Nurse Practitioner

## 2021-01-13 ENCOUNTER — Encounter (HOSPITAL_COMMUNITY): Payer: Self-pay | Admitting: Emergency Medicine

## 2021-01-13 ENCOUNTER — Other Ambulatory Visit: Payer: Self-pay

## 2021-01-13 ENCOUNTER — Emergency Department (HOSPITAL_COMMUNITY)
Admission: EM | Admit: 2021-01-13 | Discharge: 2021-01-13 | Disposition: A | Discharge status: Home / Self Care | Payer: Medicaid Other | Attending: Emergency Medicine | Admitting: Emergency Medicine

## 2021-01-13 ENCOUNTER — Emergency Department (HOSPITAL_COMMUNITY): Payer: Medicaid Other

## 2021-01-13 DIAGNOSIS — S63616A Unspecified sprain of right little finger, initial encounter: Secondary | ICD-10-CM | POA: Diagnosis not present

## 2021-01-13 DIAGNOSIS — J4520 Mild intermittent asthma, uncomplicated: Secondary | ICD-10-CM | POA: Insufficient documentation

## 2021-01-13 DIAGNOSIS — E1122 Type 2 diabetes mellitus with diabetic chronic kidney disease: Secondary | ICD-10-CM | POA: Diagnosis not present

## 2021-01-13 DIAGNOSIS — W230XXA Caught, crushed, jammed, or pinched between moving objects, initial encounter: Secondary | ICD-10-CM | POA: Diagnosis not present

## 2021-01-13 DIAGNOSIS — I129 Hypertensive chronic kidney disease with stage 1 through stage 4 chronic kidney disease, or unspecified chronic kidney disease: Secondary | ICD-10-CM | POA: Diagnosis not present

## 2021-01-13 DIAGNOSIS — N184 Chronic kidney disease, stage 4 (severe): Secondary | ICD-10-CM | POA: Diagnosis not present

## 2021-01-13 DIAGNOSIS — Z79899 Other long term (current) drug therapy: Secondary | ICD-10-CM | POA: Diagnosis not present

## 2021-01-13 DIAGNOSIS — S63656A Sprain of metacarpophalangeal joint of right little finger, initial encounter: Secondary | ICD-10-CM | POA: Diagnosis not present

## 2021-01-13 DIAGNOSIS — S6991XA Unspecified injury of right wrist, hand and finger(s), initial encounter: Secondary | ICD-10-CM | POA: Diagnosis not present

## 2021-01-13 DIAGNOSIS — Z91040 Latex allergy status: Secondary | ICD-10-CM | POA: Diagnosis not present

## 2021-01-13 NOTE — ED Provider Notes (Signed)
[facility] EMERGENCY DEPARTMENT Provider Note   CSN: [id number] Arrival date & time: 01/13/21  1354     History Chief Complaint  Patient presents with   Hand Pain    Margaret Aguilar is a 60 y.o. female.  60 year old female presents today for evaluation of right pinky finger injury that occurred last night.  She is right-handed.  She reports she was extending out to open refrigerator door when she heard a pop.  She reports significant pain since the injury.  She reports she was only able to sleep after smoking a joint.  Denies significant swelling.  Has not tried anything else over-the-counter.  Pain worsened by palpation.  The history is provided by the patient. No language interpreter was used.  Hand Pain      Past Medical History:  Diagnosis Date   Anemia    Anxiety    Arthritis    Asthma    Back pain    Bradycardia    Depression    Diabetes ([place])    Diabetes mellitus without complication ([place])    x 5 yrs   Drug use    GERD (gastroesophageal reflux disease)    Gout    Herpes    High cholesterol    HLD (hyperlipidemia)    Hypertension    Joint pain    Kidney disease    Stage 4   Kidney disease, chronic, stage IV (severe, EGFR 15-29 ml/min) (HCC)    Palpitations    Pneumonia    several   Sleep apnea    hasn't gotten cpap yet   SOB (shortness of breath)    Vitamin D-dependent rickets     Patient Active Problem List   Diagnosis Date Noted   Essential hypertension 12/14/2019   Mild intermittent asthma without complication 56/25/6389   Vitamin D deficiency 11/24/2019   Bradycardia 08/16/2018   Gout attack 08/09/2018   Acute on chronic renal failure ([place]) 08/08/2018   CKD (chronic kidney disease) stage 4, GFR 15-29 ml/min ([place]) 01/09/2017   Spondylosis of lumbosacral region 10/09/2016   Type 2 diabetes mellitus, without long-term current use of insulin ([place]) 09/18/2016   IDA (iron deficiency anemia) 12/14/2013   Guaiac + stool 12/14/2013    Hypertension 09/28/2013   Diabetes ([place]) 09/28/2013   Renal insufficiency 09/28/2013   Morbid obesity ([place]) 09/28/2013   Obstructive sleep apnea on CPAP 06/05/2011   Hyperlipidemia 06/05/2011   Gastroesophageal reflux disease 06/05/2011   Arthritis 06/05/2011    Past Surgical History:  Procedure Laterality Date   AV FISTULA PLACEMENT Left 10/19/2018   Procedure: LEFT ARTERIOVENOUS (AV) FISTULA CREATION;  Surgeon: Waynetta Sandy, MD;  Location: [facility];  Service: Vascular;  Laterality: Left;   [place] Left 06/30/2019   Procedure: Bascilic Vein Transposition;  Surgeon: Waynetta Sandy, MD;  Location: [facility];  Service: Vascular;  Laterality: Left;   CESAREAN SECTION     x 1   COLONOSCOPY N/A 01/12/2014   Procedure: COLONOSCOPY;  Surgeon: Rogene Houston, MD;  [facility];  Service: Endoscopy;  Laterality: N/A;  225   D&C     TUBAL LIGATION       OB History     Gravida  5   Para  3   Term      Preterm      AB      Living         SAB      IAB  Ectopic      Multiple      Live Births              Family History  Problem Relation Age of Onset   Diabetes Mother    Hyperlipidemia Mother    Depression Mother    Anxiety disorder Mother    Sleep apnea Mother    Eating disorder Mother    Obesity Mother    Hyperlipidemia Father    Hypertension Father    Heart disease Father    Sleep apnea Father    Alcoholism Father    Drug abuse Father    Stroke Brother    Diabetes Brother     Social History   Tobacco Use   Smoking status: Never   Smokeless tobacco: Never  Vaping Use   Vaping Use: Never used  Substance Use Topics   Alcohol use: No   Drug use: Yes    Types: Marijuana    Home Medications Prior to Admission medications   Medication Sig Start Date End Date Taking? Authorizing Provider  albuterol (PROVENTIL) (2.5 MG/3ML) 0.083% nebulizer solution Take 3 mLs (2.5 mg total) by nebulization every 6  (six) hours as needed for wheezing or shortness of breath. 12/22/19   [name], NP  albuterol (VENTOLIN HFA) 108 (90 Base) MCG/ACT inhaler Inhale 2 puffs into the lungs every 6 (six) hours as needed for wheezing or shortness of breath. 12/14/19   [name], NP  allopurinol (ZYLOPRIM) 100 MG tablet Take 100 mg by mouth daily.    [provider]  amLODipine (NORVASC) 10 MG tablet Take 10 mg by mouth daily. 10/19/19   [provider]  calcitRIOL (ROCALTROL) 0.25 MCG capsule Take 0.25 mcg by mouth daily.    [provider]  isosorbide-hydrALAZINE (BIDIL) 20-37.5 MG tablet Take by mouth 2 (two) times daily.    [provider]  metoprolol succinate (TOPROL-XL) 100 MG 24 hr tablet Take 50 mg by mouth daily. Take with or immediately following a meal.    [provider]  montelukast (SINGULAIR) 10 MG tablet Take 1 tablet (10 mg total) by mouth at bedtime. 12/22/19 12/21/20  [name], NP    Allergies    Nutritional supplements and Latex  Review of Systems   Review of Systems  Musculoskeletal:  Positive for arthralgias. Negative for joint swelling.  Skin:  Negative for color change and wound.  All other systems reviewed and are negative.  Physical Exam Updated Vital Signs BP (!) 159/78 (BP Location: Right Arm)   Pulse 61   Temp 98 F (36.7 C) (Oral)   Resp 16   LMP  (LMP Unknown)   SpO2 100%   Physical Exam Vitals and nursing note reviewed.  Constitutional:      General: She is not in acute distress.    Appearance: Normal appearance. She is not ill-appearing.  HENT:     Head: Normocephalic and atraumatic.     Nose: Nose normal.  Eyes:     Conjunctiva/sclera: Conjunctivae normal.  Pulmonary:     Effort: Pulmonary effort is normal. No respiratory distress.  Musculoskeletal:        General: No deformity.     Comments: No visual injury or deformity to her 4th finger of right hand.  Range of motion limited on flexion secondary to  pain.  Right hand is neurovascularly intact.  Tenderness to palpation present over fourth finger.  No swelling present.  Skin:    Findings:  No rash.  Neurological:     Mental Status: She is alert.    ED Results / Procedures / Treatments   Labs (all labs ordered are listed, but only abnormal results are displayed) Labs Reviewed - No data to display  EKG None  Radiology DG Hand Complete Right  Result Date: 01/13/2021 CLINICAL DATA:  Pain. Jammed RIGHT pinky last night while closing her refrigerator. EXAM: RIGHT HAND - COMPLETE 3+ VIEW COMPARISON:  None. FINDINGS: There is no evidence of fracture or dislocation. There is no evidence of arthropathy or other focal bone abnormality. Soft tissues are unremarkable. IMPRESSION: Negative. Electronically Signed   By: Nolon Nations M.D.   On: 01/13/2021 15:07    Procedures Procedures   Medications Ordered in ED Medications - No data to display  ED Course  I have reviewed the triage vital signs and the nursing notes.  Pertinent labs & imaging results that were available during my care of the patient were reviewed by me and considered in my medical decision making (see chart for details).    MDM Rules/Calculators/A&P                           60 year old female presents today for evaluation of little finger of right hand following injury last night while opening refrigerator door.  She did not sustain direct trauma to her affected digit.  X-rays of right hand are without fracture or soft tissue swelling.  Discussed supportive care.  Patient is appropriate for discharge.  Final Clinical Impression(s) / ED Diagnoses Final diagnoses:  Sprain of right little finger, unspecified site of digit, initial encounter    Rx / DC Orders ED Discharge Orders     None        Evlyn Courier, PA-C 01/13/21 1620    Noemi Chapel, MD 01/14/21 1651

## 2021-01-13 NOTE — ED Triage Notes (Signed)
Pt reports right pinky pain after jamming it on her refrigerator last night. Pt states she "heard it pop."

## 2021-01-13 NOTE — Discharge Instructions (Signed)
As discussed you have a soft tissue injury (sprain) to your pinky finger. Your x rays are negative for fractures. Continue tylenol for pain control as well icing the affected the area. You can wear a brace on your hand that'll cover the affected finger.

## 2021-01-14 ENCOUNTER — Telehealth: Payer: Self-pay

## 2021-01-14 NOTE — Telephone Encounter (Signed)
Transition Care Management Unsuccessful Follow-up Telephone Call  Date of discharge and from where:  01/13/2021-[place]   Attempts:  1st Attempt  Reason for unsuccessful TCM follow-up call:  Left voice message

## 2021-01-15 NOTE — Telephone Encounter (Signed)
Transition Care Management Unsuccessful Follow-up Telephone Call  Date of discharge and from where:  01/13/2021 from [facility]  Attempts:  2nd Attempt  Reason for unsuccessful TCM follow-up call:  Left voice message

## 2021-01-16 NOTE — Telephone Encounter (Signed)
Transition Care Management Unsuccessful Follow-up Telephone Call  Date of discharge and from where:  01/13/2021-[place]   Attempts:  3rd Attempt  Reason for unsuccessful TCM follow-up call:  Left voice message

## 2021-03-29 DIAGNOSIS — Z6841 Body Mass Index (BMI) 40.0 and over, adult: Secondary | ICD-10-CM | POA: Diagnosis not present

## 2021-03-29 DIAGNOSIS — N189 Chronic kidney disease, unspecified: Secondary | ICD-10-CM | POA: Diagnosis not present

## 2021-03-29 DIAGNOSIS — I770 Arteriovenous fistula, acquired: Secondary | ICD-10-CM | POA: Diagnosis not present

## 2021-03-29 DIAGNOSIS — E1129 Type 2 diabetes mellitus with other diabetic kidney complication: Secondary | ICD-10-CM | POA: Diagnosis not present

## 2021-03-29 DIAGNOSIS — N2581 Secondary hyperparathyroidism of renal origin: Secondary | ICD-10-CM | POA: Diagnosis not present

## 2021-03-29 DIAGNOSIS — N185 Chronic kidney disease, stage 5: Secondary | ICD-10-CM | POA: Diagnosis not present

## 2021-03-29 DIAGNOSIS — M109 Gout, unspecified: Secondary | ICD-10-CM | POA: Diagnosis not present

## 2021-03-29 DIAGNOSIS — D631 Anemia in chronic kidney disease: Secondary | ICD-10-CM | POA: Diagnosis not present

## 2021-03-29 DIAGNOSIS — I120 Hypertensive chronic kidney disease with stage 5 chronic kidney disease or end stage renal disease: Secondary | ICD-10-CM | POA: Diagnosis not present

## 2021-04-24 ENCOUNTER — Other Ambulatory Visit (HOSPITAL_COMMUNITY): Payer: Self-pay | Admitting: *Deleted

## 2021-04-25 ENCOUNTER — Inpatient Hospital Stay (HOSPITAL_COMMUNITY): Admission: RE | Admit: 2021-04-25 | Payer: Medicaid Other | Source: Ambulatory Visit

## 2021-05-02 ENCOUNTER — Encounter (HOSPITAL_COMMUNITY): Payer: Medicaid Other

## 2021-05-14 ENCOUNTER — Telehealth: Payer: Self-pay

## 2021-05-14 DIAGNOSIS — I120 Hypertensive chronic kidney disease with stage 5 chronic kidney disease or end stage renal disease: Secondary | ICD-10-CM | POA: Diagnosis not present

## 2021-05-14 DIAGNOSIS — M109 Gout, unspecified: Secondary | ICD-10-CM | POA: Diagnosis not present

## 2021-05-14 DIAGNOSIS — N185 Chronic kidney disease, stage 5: Secondary | ICD-10-CM | POA: Diagnosis not present

## 2021-05-14 DIAGNOSIS — E1129 Type 2 diabetes mellitus with other diabetic kidney complication: Secondary | ICD-10-CM | POA: Diagnosis not present

## 2021-05-14 DIAGNOSIS — N2581 Secondary hyperparathyroidism of renal origin: Secondary | ICD-10-CM | POA: Diagnosis not present

## 2021-05-14 DIAGNOSIS — N189 Chronic kidney disease, unspecified: Secondary | ICD-10-CM | POA: Diagnosis not present

## 2021-05-14 DIAGNOSIS — Z6841 Body Mass Index (BMI) 40.0 and over, adult: Secondary | ICD-10-CM | POA: Diagnosis not present

## 2021-05-14 DIAGNOSIS — I770 Arteriovenous fistula, acquired: Secondary | ICD-10-CM | POA: Diagnosis not present

## 2021-05-14 DIAGNOSIS — D631 Anemia in chronic kidney disease: Secondary | ICD-10-CM | POA: Diagnosis not present

## 2021-05-14 NOTE — Telephone Encounter (Signed)
Called pt to schedule her sleep study but could not leave a message due to pt not having vm

## 2021-05-15 ENCOUNTER — Ambulatory Visit (INDEPENDENT_AMBULATORY_CARE_PROVIDER_SITE_OTHER): Payer: Medicaid Other | Admitting: Nurse Practitioner

## 2021-05-15 ENCOUNTER — Encounter: Payer: Self-pay | Admitting: Nurse Practitioner

## 2021-05-15 ENCOUNTER — Other Ambulatory Visit: Payer: Self-pay

## 2021-05-15 VITALS — BP 151/85 | HR 68 | Temp 97.4°F | Ht 62.0 in | Wt 227.1 lb

## 2021-05-15 DIAGNOSIS — J4520 Mild intermittent asthma, uncomplicated: Secondary | ICD-10-CM

## 2021-05-15 DIAGNOSIS — K635 Polyp of colon: Secondary | ICD-10-CM

## 2021-05-15 DIAGNOSIS — I10 Essential (primary) hypertension: Secondary | ICD-10-CM | POA: Diagnosis not present

## 2021-05-15 DIAGNOSIS — Z1231 Encounter for screening mammogram for malignant neoplasm of breast: Secondary | ICD-10-CM

## 2021-05-15 DIAGNOSIS — Z0000 Encounter for general adult medical examination without abnormal findings: Secondary | ICD-10-CM | POA: Diagnosis not present

## 2021-05-15 DIAGNOSIS — Z124 Encounter for screening for malignant neoplasm of cervix: Secondary | ICD-10-CM

## 2021-05-15 LAB — POCT URINALYSIS DIP (CLINITEK)
Bilirubin, UA: NEGATIVE
Glucose, UA: NEGATIVE mg/dL
Ketones, POC UA: NEGATIVE mg/dL
Leukocytes, UA: NEGATIVE
Nitrite, UA: NEGATIVE
POC PROTEIN,UA: >=300 — AB
Spec Grav, UA: 1.025 (ref 1.010–1.025)
Urobilinogen, UA: 0.2 E.U./dL
pH, UA: 6 (ref 5.0–8.0)

## 2021-05-15 LAB — POCT GLYCOSYLATED HEMOGLOBIN (HGB A1C)
HbA1c POC (<> result, manual entry): 5.5 % (ref 4.0–5.6)
HbA1c, POC (controlled diabetic range): 5.5 % (ref 0.0–7.0)
HbA1c, POC (prediabetic range): 5.5 % — AB (ref 5.7–6.4)
Hemoglobin A1C: 5.5 % (ref 4.0–5.6)

## 2021-05-15 MED ORDER — ALBUTEROL SULFATE HFA 108 (90 BASE) MCG/ACT IN AERS: 2.0000 | INHALATION_SPRAY | Freq: Four times a day (QID) | RESPIRATORY_TRACT | 0 refills | Status: DC | PRN

## 2021-05-15 NOTE — Progress Notes (Signed)
Margaret Aguilar, [name]  [address] Phone:  [phone]   Fax:  [phone]    Established Patient Office Visit  Subjective:  Patient ID: Margaret Aguilar, female    DOB: 12-01-1960  Age: 61 y.o. MRN: [id number]  CC:  Chief Complaint  Patient presents with   Follow-up    Pt is here for a knot on her upper back also need a refill albuterol inhaler    HPI Margaret Aguilar presents for follow up. She has been lost to follow up. 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   Margaret Aguilar is in today for follow up for Hypertension. She has been diagnosed with Stage 5 CKD related to non adherence to treatment. 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 She reports having a fistula placed however no treatment has been started. She mentioned that her husband in going to donate her a kidney. The current prescribed treatment is furosemide 40 mg daily, metoprolol XL 50 mg  Bidil 20/37.5 mg BID and amlodipine 10 mg. She is working on her weight 9 pounds. Home bp BP 142/82. She is wondering of Ozempic would be beneficial with her weight loss.   Denies headache, dizziness, visual changes, shortness of breath, dyspnea on exertion, chest pain, nausea, vomiting or any edema.    She thought that she had COVID. She is having some allergy treatment.  She is cooking. She feels like her food does not taste the same. She is unsure of   She has had a knot on her back for 3 months.  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) (HCC)    Palpitations    Pneumonia    several   Sleep apnea    hasn't gotten cpap yet   SOB (shortness of breath)    Vitamin D-dependent rickets     Past Surgical History:  Procedure Laterality Date   AV FISTULA PLACEMENT Left 10/19/2018   Procedure: LEFT ARTERIOVENOUS (AV) FISTULA CREATION;  Surgeon: Waynetta Sandy, MD;  Location: [facility];  Service: Vascular;  Laterality: Left;   [place] Left 06/30/2019   Procedure: Bascilic Vein Transposition;  Surgeon: Waynetta Sandy, MD;  Location: [facility];  Service: Vascular;  Laterality: Left;   CESAREAN SECTION     x 1   COLONOSCOPY N/A 01/12/2014   Procedure: COLONOSCOPY;  Surgeon: Rogene Houston, MD;  [facility];  Service: Endoscopy;  Laterality: N/A;  225   D&C     TUBAL LIGATION  Family History  Problem Relation Age of Onset   Diabetes Mother    Hyperlipidemia Mother    Depression Mother    Anxiety disorder Mother    Sleep apnea Mother    Eating disorder Mother    Obesity Mother    Hyperlipidemia Father    Hypertension Father    Heart disease Father    Sleep apnea Father    Alcoholism Father    Drug abuse Father    Stroke Brother    Diabetes Brother     Social History   Socioeconomic History   Marital status: Married    Spouse name: Margaret Aguilar   Number of children: Not on file   Years of education: Not on file   Highest education level: Not on file  Occupational History   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 stage 4  Tobacco Use   Smoking status: Never   Smokeless  tobacco: Never  Vaping Use   Vaping Use: Never used  Substance and Sexual Activity   Alcohol use: No   Drug use: Yes    Types: Marijuana   Sexual activity: Not Currently    Birth control/protection: Post-menopausal  Other Topics Concern   Not on file  Social History Narrative   Not on file   Social Determinants of Health   Financial Resource Strain: Not on file  Food Insecurity: Not on file  Transportation Needs: Not on file  Physical Activity: Not on file  Stress: Not on file  Social Connections: Not on file  Intimate Partner Violence: Not on file    Outpatient Medications Prior to Visit  Medication Sig Dispense Refill   albuterol (PROVENTIL) (2.5 MG/3ML) 0.083% nebulizer solution Take 3 mLs (2.5 mg total) by nebulization every 6 (six) hours as needed for wheezing or shortness of breath. 150 mL 11   allopurinol (ZYLOPRIM) 100 MG tablet Take 100 mg by mouth daily.     amLODipine (NORVASC) 10 MG tablet Take 10 mg by mouth daily.     calcitRIOL (ROCALTROL) 0.25 MCG capsule Take 0.25 mcg by mouth daily.     isosorbide-hydrALAZINE (BIDIL) 20-37.5 MG tablet Take by mouth 2 (two) times daily.     metoprolol succinate (TOPROL-XL) 100 MG 24 hr tablet Take 50 mg by mouth daily. Take with or immediately following a meal.     albuterol (VENTOLIN HFA) 108 (90 Base) MCG/ACT inhaler Inhale 2 puffs into the lungs every 6 (six) hours as needed for wheezing or shortness of breath. 8 g 0   furosemide (LASIX) 40 MG tablet Take 40 mg by mouth daily.     montelukast (SINGULAIR) 10 MG tablet Take 1 tablet (10 mg total) by mouth at bedtime. 90 tablet 3   No facility-administered medications prior to visit.    Allergies  Allergen Reactions   Nutritional Supplements     Walnut >> UNSPECIFIED REACTION  Severity from PMH   Latex Swelling and Rash    SWELLING REACTION UNSPECIFIED     ROS Review of Systems    Objective:    Physical Exam Constitutional:      Appearance: She is obese.   HENT:     Head: Normocephalic and atraumatic.     Nose: Nose normal.     Mouth/Throat:     Mouth: Mucous membranes are moist.  Cardiovascular:     Rate and Rhythm: Normal rate and regular rhythm.     Pulses: Normal pulses.     Heart sounds: Normal heart sounds.  Pulmonary:  Effort: Pulmonary effort is normal.     Breath sounds: Normal breath sounds.  Abdominal:     Palpations: Abdomen is soft.     Comments: hypoactive  Musculoskeletal:        General: Normal range of motion.     Cervical back: Normal range of motion.  Skin:    General: Skin is warm and dry.     Capillary Refill: Capillary refill takes less than 2 seconds.     Comments: Fistula in left upper arm  Neurological:     General: No focal deficit present.     Mental Status: She is alert and oriented to person, place, and time.  Psychiatric:        Mood and Affect: Mood normal.        Behavior: Behavior normal.        Thought Content: Thought content normal.        Judgment: Judgment normal.    BP (!) 151/85    Pulse 68    Temp (!) 97.4 F (36.3 C)    Ht '5\' 2"'  (1.575 m)    Wt 227 lb 2 oz (103 kg)    LMP  (LMP Unknown)    SpO2 100%    BMI 41.54 kg/m  Wt Readings from Last 3 Encounters:  05/15/21 227 lb 2 oz (103 kg)  05/17/20 239 lb 5 oz (108.6 kg)  05/02/20 230 lb (104.3 kg)     Health Maintenance Due  Topic Date Due   FOOT EXAM  Never done   PAP SMEAR-Modifier  Never done   MAMMOGRAM  Never done   OPHTHALMOLOGY EXAM  07/25/2020    There are no preventive care reminders to display for this patient.  Lab Results  Component Value Date   TSH 2.970 11/23/2019   Lab Results  Component Value Date   WBC 18.0 (H) 08/17/2018   HGB 12.9 06/30/2019   HCT 38.0 06/30/2019   MCV 77.0 (L) 08/17/2018   PLT 450 (H) 08/17/2018   Lab Results  Component Value Date   NA 143 06/30/2019   K 3.2 (L) 06/30/2019   CO2 22 08/16/2018   GLUCOSE 100 (H) 06/30/2019   BUN 36 (H) 06/30/2019   CREATININE 3.70 (H)  06/30/2019   BILITOT 0.3 08/16/2018   ALKPHOS 88 08/16/2018   AST 22 08/16/2018   ALT 49 (H) 08/16/2018   PROT 7.9 08/16/2018   ALBUMIN 3.6 08/16/2018   CALCIUM 8.9 08/16/2018   ANIONGAP 11 08/16/2018   Lab Results  Component Value Date   CHOL 215 (H) 11/23/2019   Lab Results  Component Value Date   HDL 46 11/23/2019   Lab Results  Component Value Date   LDLCALC 143 (H) 11/23/2019   Lab Results  Component Value Date   TRIG 146 11/23/2019   Lab Results  Component Value Date   CHOLHDL 5.2 (H) 01/12/2018   Lab Results  Component Value Date   HGBA1C 5.5 05/15/2021   HGBA1C 5.5 05/15/2021   HGBA1C 5.5 (A) 05/15/2021   HGBA1C 5.5 05/15/2021      Assessment & Plan:   Problem List Items Addressed This Visit       Cardiovascular and Mediastinum   Essential hypertension - Primary Persistent  Encouraged on going compliance with current medication regimen Encouraged home monitoring and recording BP <130/80 Eating a heart-healthy diet with less salt Encouraged regular physical activity  Recommend Weight loss     Relevant Medications   furosemide (LASIX) 40  MG tablet   Other Relevant Orders   Microalbumin, urine (Completed)     Respiratory   Mild intermittent asthma without complication Stable   Relevant Medications   albuterol (VENTOLIN HFA) 108 (90 Base) MCG/ACT inhaler   Other Visit Diagnoses     Health care maintenance       Relevant Orders   POCT glycosylated hemoglobin (Hb A1C) (Completed)   POCT URINALYSIS DIP (CLINITEK) (Completed)   MM 3D SCREEN BREAST BILATERAL   Pedunculated colonic polyp       Relevant Orders   Ambulatory referral to Gastroenterology   Encounter for screening mammogram for malignant neoplasm of breast           Meds ordered this encounter  Medications   albuterol (VENTOLIN HFA) 108 (90 Base) MCG/ACT inhaler    Sig: Inhale 2 puffs into the lungs every 6 (six) hours as needed for wheezing or shortness of breath.     Dispense:  8 g    Refill:  0    Order Specific Question:   Supervising Provider    Answer:   Tresa Garter [[id number]]    Follow-up: Return in about 3 months (around 08/12/2021) for medical record release for labs at [facility] [place] [phone] at 57 or 9 am .    [name], NP

## 2021-05-16 ENCOUNTER — Encounter: Payer: Self-pay | Admitting: Nurse Practitioner

## 2021-05-16 LAB — MICROALBUMIN, URINE: Microalbumin, Urine: 1006.8 ug/mL

## 2021-05-18 ENCOUNTER — Encounter: Payer: Self-pay | Admitting: Nurse Practitioner

## 2021-06-03 ENCOUNTER — Ambulatory Visit (INDEPENDENT_AMBULATORY_CARE_PROVIDER_SITE_OTHER): Payer: Medicaid Other | Admitting: Neurology

## 2021-06-03 DIAGNOSIS — G4733 Obstructive sleep apnea (adult) (pediatric): Secondary | ICD-10-CM

## 2021-06-05 NOTE — Progress Notes (Signed)
[facility]  HOME SLEEP TEST (Watch PAT) REPORT  STUDY DATE: 06/03/2021  DOB: 1960/07/07  MRN: [id number]  ORDERING CLINICIAN: Star Age, MD, PhD   REFERRING CLINICIAN: [name], NP   CLINICAL INFORMATION/HISTORY: 61 year old right-handed woman with an underlying medical history of asthma, palpitations, hypertension, hyperlipidemia, gout, reflux disease, diabetes, chronic kidney disease, depression, anxiety, arthritis, anemia, back pain, bradycardia, and morbid obesity with a BMI of over 40, who was previously diagnosed with obstructive sleep apnea in 2015. She has not been on PAP therapy.  Epworth sleepiness score: 16/24.  BMI: 44.2 kg/m  FINDINGS:   Sleep Summary:   Total Recording Time (hours, min): 9 hours, 10 minutes  Total Sleep Time (hours, min):  7 hours, 13 minutes   Percent REM (%):    24.5%   Respiratory Indices:   Calculated pAHI (per hour):  53.8/hour         REM pAHI:    56.3/hour       NREM pAHI: 52.9/hour  Oxygen Saturation Statistics:    Oxygen Saturation (%) Mean: 94%   Minimum oxygen saturation (%):                 85%   O2 Saturation Range (%): 85-99%    O2 Saturation (minutes) <=88%: 1.3 min  Pulse Rate Statistics:   Pulse Mean (bpm):    63/min    Pulse Range (45-90/min)   IMPRESSION: OSA (obstructive sleep apnea), severe  RECOMMENDATION:  This home sleep test demonstrates severe obstructive sleep apnea with a total AHI of 53.8/hour and O2 nadir of 85%.  Snoring ranged from mild to loud. Treatment with positive airway pressure is highly recommended. This will require - ideally - a full night CPAP titration study for proper treatment settings, O2 monitoring and mask fitting. For now, the patient will be advised to proceed with an autoPAP titration/trial at home.  A laboratory attended titration study can be considered in the future for optimization of his treatment and better tolerance of therapy.  Alternative  treatment options are limited secondary to the severity of the patient's sleep disordered breathing.  Treatment with inspire hypoglossal nerve stimulator can be considered in the future if patient meets criteria.  Concomitant weight loss is recommended. Please note, that untreated obstructive sleep apnea may carry additional perioperative morbidity. Patients with significant obstructive sleep apnea should receive perioperative PAP therapy and the surgeons and particularly the anesthesiologist should be informed of the diagnosis and the severity of the sleep disordered breathing. The patient should be cautioned not to drive, work at heights, or operate dangerous or heavy equipment when tired or sleepy. Review and reiteration of good sleep hygiene measures should be pursued with any patient. Other causes of the patient's symptoms, including circadian rhythm disturbances, an underlying mood disorder, medication effect and/or an underlying medical problem cannot be ruled out based on this test. Clinical correlation is recommended. The patient and her referring provider will be notified of the test results. The patient will be seen in follow up in sleep clinic at [facility].  I certify that I have reviewed the raw data recording prior to the issuance of this report in accordance with the standards of the American Academy of Sleep Medicine (AASM).  INTERPRETING PHYSICIAN:   Star Age, MD, PhD  Board Certified in Neurology and Sleep Medicine  [facility] [address] [place], [place] [address] [phone]

## 2021-06-05 NOTE — Procedures (Signed)
[facility]  HOME SLEEP TEST (Watch PAT) REPORT  STUDY DATE: 06/03/2021  DOB: [birth year]  MRN: [id number]  ORDERING CLINICIAN: Star Age, MD, PhD   REFERRING CLINICIAN: [name], NP   CLINICAL INFORMATION/HISTORY: 60 year old right-handed woman with an underlying medical history of asthma, palpitations, hypertension, hyperlipidemia, gout, reflux disease, diabetes, chronic kidney disease, depression, anxiety, arthritis, anemia, back pain, bradycardia, and morbid obesity with a BMI of over 40, who was previously diagnosed with obstructive sleep apnea in 2015. She has not been on PAP therapy.  Epworth sleepiness score: 16/24.  BMI: 44.2 kg/m  FINDINGS:   Sleep Summary:   Total Recording Time (hours, min): 9 hours, 10 minutes  Total Sleep Time (hours, min):  7 hours, 13 minutes   Percent REM (%):    24.5%   Respiratory Indices:   Calculated pAHI (per hour):  53.8/hour         REM pAHI:    56.3/hour       NREM pAHI: 52.9/hour  Oxygen Saturation Statistics:    Oxygen Saturation (%) Mean: 94%   Minimum oxygen saturation (%):                 85%   O2 Saturation Range (%): 85-99%    O2 Saturation (minutes) <=88%: 1.3 min  Pulse Rate Statistics:   Pulse Mean (bpm):    63/min    Pulse Range (45-90/min)   IMPRESSION: OSA (obstructive sleep apnea), severe  RECOMMENDATION:  This home sleep test demonstrates severe obstructive sleep apnea with a total AHI of 53.8/hour and O2 nadir of 85%.  Snoring ranged from mild to loud. Treatment with positive airway pressure is highly recommended. This will require - ideally - a full night CPAP titration study for proper treatment settings, O2 monitoring and mask fitting. For now, the patient will be advised to proceed with an autoPAP titration/trial at home.  A laboratory attended titration study can be considered in the future for optimization of his treatment and better tolerance of therapy.  Alternative  treatment options are limited secondary to the severity of the patient's sleep disordered breathing.  Treatment with inspire hypoglossal nerve stimulator can be considered in the future if patient meets criteria.  Concomitant weight loss is recommended. Please note, that untreated obstructive sleep apnea may carry additional perioperative morbidity. Patients with significant obstructive sleep apnea should receive perioperative PAP therapy and the surgeons and particularly the anesthesiologist should be informed of the diagnosis and the severity of the sleep disordered breathing. The patient should be cautioned not to drive, work at heights, or operate dangerous or heavy equipment when tired or sleepy. Review and reiteration of good sleep hygiene measures should be pursued with any patient. Other causes of the patient's symptoms, including circadian rhythm disturbances, an underlying mood disorder, medication effect and/or an underlying medical problem cannot be ruled out based on this test. Clinical correlation is recommended. The patient and her referring provider will be notified of the test results. The patient will be seen in follow up in sleep clinic at [facility].  I certify that I have reviewed the raw data recording prior to the issuance of this report in accordance with the standards of the American Academy of Sleep Medicine (AASM).  INTERPRETING PHYSICIAN:   Star Age, MD, PhD  Board Certified in Neurology and Sleep Medicine  [facility] [address], [place], [place] [address] [phone]

## 2021-06-05 NOTE — Addendum Note (Signed)
Addended by: Star Age on: 06/05/2021 03:49 PM   Modules accepted: Orders

## 2021-06-06 ENCOUNTER — Telehealth: Payer: Self-pay

## 2021-06-06 NOTE — Telephone Encounter (Signed)
I called pt. I advised pt that Dr. Rexene Alberts reviewed their sleep study results and found that pt has severe osa. Dr. Rexene Alberts recommends that pt start auto pap at home. I reviewed PAP compliance expectations with the pt. Pt is agreeable to starting an auto-PAP. I advised pt that an order will be sent to a DME, [facility], and [facility] will call the pt within about one week after they file with the pt's insurance. [facility] will show the pt how to use the machine, fit for masks, and troubleshoot the auto-PAP if needed. A follow up appt was made for insurance purposes with Jinny Blossom, NP on 09/10/21 at 11:30am. Pt verbalized understanding to arrive 15 minutes early and bring their auto-PAP. A letter with all of this information in it will be mailed to the pt as a reminder. I verified with the pt that the address we have on file is correct. Pt verbalized understanding of results. Pt had no questions at this time but was encouraged to call back if questions arise. I have sent the order to [place] and have received confirmation that they have received the order.  I called [facility]. Patient's office note from 05/17/2020 and sleep study from 06/03/2021 should be ok. They will let us know if her insurance rejects this and requires a sooner F2F and sleep study.

## 2021-06-06 NOTE — Telephone Encounter (Signed)
-----   Message from Star Age, MD sent at 06/05/2021  3:49 PM EST ----- Patient referred by primary care nurse practitioner, seen by me on 05/17/2020, she had a home sleep test on 06/11/2021.  I am not sure why there was such a delay in getting her home sleep test, she was seen face-to-face over a year prior, hopefully, this is not a problem with her insurance covering treatment for sleep apnea.  Her home sleep test indicated severe sleep apnea.  Please advise patient that I would like for her to start an AutoPap machine at home, through a DME company (of her choice, or as per insurance requirement). The DME representative will fit the patient with a mask of choice, educate her on how to use the machine, how to put the mask on, etc. I have placed an order in the chart. Please send the order to a local DME, talk to patient, send report to referring MD. Please also reinforce the need for compliance with treatment. We will need a FU in sleep clinic for 10 weeks post-PAP set up, please arrange that with me or one of our NPs. Thanks,   Star Age, MD, PhD [facility])

## 2021-06-11 ENCOUNTER — Ambulatory Visit (HOSPITAL_COMMUNITY)
Admission: RE | Admit: 2021-06-11 | Discharge: 2021-06-11 | Disposition: A | Discharge status: Home / Self Care | Payer: Medicaid Other | Source: Ambulatory Visit | Attending: Nephrology | Admitting: Nephrology

## 2021-06-11 ENCOUNTER — Other Ambulatory Visit: Payer: Self-pay

## 2021-06-11 DIAGNOSIS — D631 Anemia in chronic kidney disease: Secondary | ICD-10-CM | POA: Insufficient documentation

## 2021-06-11 DIAGNOSIS — N189 Chronic kidney disease, unspecified: Secondary | ICD-10-CM | POA: Insufficient documentation

## 2021-06-11 MED ORDER — SODIUM CHLORIDE 0.9 % IV SOLN
510.0000 mg | INTRAVENOUS | Status: DC
  Administered 2021-06-11: 510 mg via INTRAVENOUS
  Filled 2021-06-11: qty 510

## 2021-06-18 ENCOUNTER — Ambulatory Visit (HOSPITAL_COMMUNITY)
Admission: RE | Admit: 2021-06-18 | Discharge: 2021-06-18 | Disposition: A | Discharge status: Home / Self Care | Payer: Medicaid Other | Source: Ambulatory Visit | Attending: Nephrology | Admitting: Nephrology

## 2021-06-18 ENCOUNTER — Other Ambulatory Visit: Payer: Self-pay

## 2021-06-18 DIAGNOSIS — D631 Anemia in chronic kidney disease: Secondary | ICD-10-CM | POA: Insufficient documentation

## 2021-06-18 DIAGNOSIS — N189 Chronic kidney disease, unspecified: Secondary | ICD-10-CM | POA: Insufficient documentation

## 2021-06-18 MED ORDER — SODIUM CHLORIDE 0.9 % IV SOLN
510.0000 mg | INTRAVENOUS | Status: AC
  Administered 2021-06-18: 510 mg via INTRAVENOUS
  Filled 2021-06-18: qty 510

## 2021-06-24 DIAGNOSIS — N189 Chronic kidney disease, unspecified: Secondary | ICD-10-CM | POA: Diagnosis not present

## 2021-06-24 DIAGNOSIS — M109 Gout, unspecified: Secondary | ICD-10-CM | POA: Diagnosis not present

## 2021-06-24 DIAGNOSIS — N2581 Secondary hyperparathyroidism of renal origin: Secondary | ICD-10-CM | POA: Diagnosis not present

## 2021-06-24 DIAGNOSIS — E1129 Type 2 diabetes mellitus with other diabetic kidney complication: Secondary | ICD-10-CM | POA: Diagnosis not present

## 2021-06-24 DIAGNOSIS — I770 Arteriovenous fistula, acquired: Secondary | ICD-10-CM | POA: Diagnosis not present

## 2021-06-24 DIAGNOSIS — I120 Hypertensive chronic kidney disease with stage 5 chronic kidney disease or end stage renal disease: Secondary | ICD-10-CM | POA: Diagnosis not present

## 2021-06-24 DIAGNOSIS — N185 Chronic kidney disease, stage 5: Secondary | ICD-10-CM | POA: Diagnosis not present

## 2021-06-24 DIAGNOSIS — Z6841 Body Mass Index (BMI) 40.0 and over, adult: Secondary | ICD-10-CM | POA: Diagnosis not present

## 2021-06-24 DIAGNOSIS — D631 Anemia in chronic kidney disease: Secondary | ICD-10-CM | POA: Diagnosis not present

## 2021-06-24 NOTE — Telephone Encounter (Signed)
[facility]med Airsense 11 auto ?Setup 06-18-21 (appt needed 07-20-21 through 09-18-21) ?

## 2021-07-01 ENCOUNTER — Encounter (HOSPITAL_COMMUNITY): Payer: Self-pay

## 2021-07-01 ENCOUNTER — Other Ambulatory Visit: Payer: Self-pay

## 2021-07-01 ENCOUNTER — Emergency Department (HOSPITAL_COMMUNITY): Payer: Medicaid Other

## 2021-07-01 ENCOUNTER — Emergency Department (HOSPITAL_COMMUNITY)
Admission: EM | Admit: 2021-07-01 | Discharge: 2021-07-01 | Disposition: A | Discharge status: Home / Self Care | Payer: Medicaid Other | Attending: Emergency Medicine | Admitting: Emergency Medicine

## 2021-07-01 DIAGNOSIS — Z91040 Latex allergy status: Secondary | ICD-10-CM | POA: Insufficient documentation

## 2021-07-01 DIAGNOSIS — Z79899 Other long term (current) drug therapy: Secondary | ICD-10-CM | POA: Diagnosis not present

## 2021-07-01 DIAGNOSIS — E1122 Type 2 diabetes mellitus with diabetic chronic kidney disease: Secondary | ICD-10-CM | POA: Insufficient documentation

## 2021-07-01 DIAGNOSIS — M7989 Other specified soft tissue disorders: Secondary | ICD-10-CM | POA: Diagnosis not present

## 2021-07-01 DIAGNOSIS — M109 Gout, unspecified: Secondary | ICD-10-CM | POA: Insufficient documentation

## 2021-07-01 DIAGNOSIS — Z992 Dependence on renal dialysis: Secondary | ICD-10-CM | POA: Insufficient documentation

## 2021-07-01 DIAGNOSIS — M19041 Primary osteoarthritis, right hand: Secondary | ICD-10-CM | POA: Diagnosis not present

## 2021-07-01 DIAGNOSIS — M10041 Idiopathic gout, right hand: Secondary | ICD-10-CM | POA: Diagnosis not present

## 2021-07-01 DIAGNOSIS — N186 End stage renal disease: Secondary | ICD-10-CM | POA: Insufficient documentation

## 2021-07-01 DIAGNOSIS — I120 Hypertensive chronic kidney disease with stage 5 chronic kidney disease or end stage renal disease: Secondary | ICD-10-CM | POA: Insufficient documentation

## 2021-07-01 LAB — CBC WITH DIFFERENTIAL/PLATELET
Abs Immature Granulocytes: 0.03 10*3/uL (ref 0.00–0.07)
Basophils Absolute: 0 10*3/uL (ref 0.0–0.1)
Basophils Relative: 0 %
Eosinophils Absolute: 0.2 10*3/uL (ref 0.0–0.5)
Eosinophils Relative: 2 %
HCT: 33.1 % — ABNORMAL LOW (ref 36.0–46.0)
Hemoglobin: 10.3 g/dL — ABNORMAL LOW (ref 12.0–15.0)
Immature Granulocytes: 0 %
Lymphocytes Relative: 19 %
Lymphs Abs: 1.6 10*3/uL (ref 0.7–4.0)
MCH: 25.4 pg — ABNORMAL LOW (ref 26.0–34.0)
MCHC: 31.1 g/dL (ref 30.0–36.0)
MCV: 81.5 fL (ref 80.0–100.0)
Monocytes Absolute: 0.5 10*3/uL (ref 0.1–1.0)
Monocytes Relative: 7 %
Neutro Abs: 5.8 10*3/uL (ref 1.7–7.7)
Neutrophils Relative %: 72 %
Platelets: 222 10*3/uL (ref 150–400)
RBC: 4.06 MIL/uL (ref 3.87–5.11)
RDW: 18.7 % — ABNORMAL HIGH (ref 11.5–15.5)
WBC: 8.1 10*3/uL (ref 4.0–10.5)
nRBC: 0 % (ref 0.0–0.2)

## 2021-07-01 LAB — BASIC METABOLIC PANEL
Anion gap: 12 (ref 5–15)
BUN: 57 mg/dL — ABNORMAL HIGH (ref 6–20)
CO2: 18 mmol/L — ABNORMAL LOW (ref 22–32)
Calcium: 8.9 mg/dL (ref 8.9–10.3)
Chloride: 109 mmol/L (ref 98–111)
Creatinine, Ser: 5.43 mg/dL — ABNORMAL HIGH (ref 0.44–1.00)
GFR, Estimated: 9 mL/min — ABNORMAL LOW (ref >60)
Glucose, Bld: 97 mg/dL (ref 70–99)
Potassium: 3.9 mmol/L (ref 3.5–5.1)
Sodium: 139 mmol/L (ref 135–145)

## 2021-07-01 MED ORDER — COLCHICINE 0.6 MG PO TABS
1.2000 mg | ORAL_TABLET | Freq: Once | ORAL | Status: AC
  Administered 2021-07-01: 1.2 mg via ORAL
  Filled 2021-07-01: qty 2

## 2021-07-01 MED ORDER — CEPHALEXIN 500 MG PO CAPS
500.0000 mg | ORAL_CAPSULE | Freq: Once | ORAL | Status: AC
  Administered 2021-07-01: 500 mg via ORAL
  Filled 2021-07-01: qty 1

## 2021-07-01 MED ORDER — HYDROCODONE-ACETAMINOPHEN 5-325 MG PO TABS: 1.0000 | ORAL_TABLET | ORAL | 0 refills | Status: DC | PRN

## 2021-07-01 MED ORDER — COLCHICINE 0.6 MG PO TABS: 0.6000 mg | ORAL_TABLET | Freq: Two times a day (BID) | ORAL | 0 refills | Status: DC | PRN

## 2021-07-01 MED ORDER — COLCHICINE 0.6 MG PO TABS
0.6000 mg | ORAL_TABLET | Freq: Once | ORAL | Status: AC
  Administered 2021-07-01: 0.6 mg via ORAL
  Filled 2021-07-01: qty 1

## 2021-07-01 MED ORDER — CEPHALEXIN 500 MG PO CAPS: 500.0000 mg | ORAL_CAPSULE | Freq: Two times a day (BID) | ORAL | 0 refills | Status: AC

## 2021-07-01 NOTE — Discharge Instructions (Addendum)
Take your next dose of colchicine tomorrow as you have received today's doses here.  You have also been covered for possible infection in your finger with antibiotics prescribed, take the entire course of this medication.  I do suspect that this is a gout flare however, just in a new location for you.  Elevation and warm compresses can also help with pain.  You have been prescribed a pain medication, use caution with this, do not drive within 4 hours of taking this medication. ?

## 2021-07-01 NOTE — ED Provider Notes (Signed)
?[facility] ?Provider Note ? ? ?CSN: [id number] ?Arrival date & time: 07/01/21  3086 ? ?  ? ?History ? ?Chief Complaint  ?Patient presents with  ? Hand Pain  ? ? ?Margaret Aguilar is a 61 y.o. female with a history including diabetes, hypertension, end-stage renal disease not yet on dialysis but just had a fistula placed, also history significant for arthritis and gout on daily allopurinol and as needed colchicine presenting with a 4-day history of right long finger swelling which has become tender since yesterday evening.  She first noticed mild swelling at the MIP joint 4 days ago which has progressively become more swollen but was not tender until yesterday evening.  She has pain with range of motion and palpation, describes throbbing at rest.  She denies any injuries or overuse.  She states that she does not think this is her gout because she has never had it in her hands, it is usually located in her great toe or her knees.  However, states it feels like when she has gout in the toe. She has had no treatment for this condition prior to arrival.  She denies fevers or chills or any other complaints. ? ?The history is provided by the patient.  ? ?  ? ?Home Medications ?Prior to Admission medications   ?Medication Sig Start Date End Date Taking? Authorizing Provider  ?albuterol (PROVENTIL) (2.5 MG/3ML) 0.083% nebulizer solution Take 3 mLs (2.5 mg total) by nebulization every 6 (six) hours as needed for wheezing or shortness of breath. 12/22/19  Yes [name], NP  ?albuterol (VENTOLIN HFA) 108 (90 Base) MCG/ACT inhaler Inhale 2 puffs into the lungs every 6 (six) hours as needed for wheezing or shortness of breath. 05/15/21  Yes [name], NP  ?allopurinol (ZYLOPRIM) 100 MG tablet Take 100 mg by mouth daily.   Yes [provider]  ?amLODipine (NORVASC) 10 MG tablet Take 10 mg by mouth daily. 10/19/19  Yes [provider]  ?calcitRIOL (ROCALTROL) 0.25 MCG capsule Take  0.25 mcg by mouth daily.   Yes [provider]  ?cephALEXin (KEFLEX) 500 MG capsule Take 1 capsule (500 mg total) by mouth 2 (two) times daily for 7 days. 07/01/21 07/08/21 Yes Chanita Boden, [name], PA-C  ?colchicine 0.6 MG tablet Take 1 tablet (0.6 mg total) by mouth 2 (two) times daily as needed (gout flare). 07/01/21  Yes Amiri Riechers, [name], PA-C  ?furosemide (LASIX) 40 MG tablet Take 40 mg by mouth daily. 04/23/21  Yes [provider]  ?HYDROcodone-acetaminophen (NORCO/VICODIN) 5-325 MG tablet Take 1 tablet by mouth every 4 (four) hours as needed. 07/01/21  Yes Rolan Wrightsman, [name], PA-C  ?isosorbide-hydrALAZINE (BIDIL) 20-37.5 MG tablet Take by mouth 2 (two) times daily.   Yes [provider]  ?metoprolol succinate (TOPROL-XL) 100 MG 24 hr tablet Take 50 mg by mouth daily. Take with or immediately following a meal.   Yes [provider]  ?montelukast (SINGULAIR) 10 MG tablet Take 1 tablet (10 mg total) by mouth at bedtime. 12/22/19 07/01/21 Yes [name], NP  ?   ? ?Allergies    ?Nutritional supplements and Latex   ? ?Review of Systems   ?Review of Systems  ?Constitutional:  Negative for chills and fever.  ?HENT: Negative.    ?Respiratory: Negative.    ?Cardiovascular: Negative.   ?Musculoskeletal:  Positive for arthralgias and joint swelling. Negative for myalgias.  ?Skin:  Negative for wound.  ?Neurological:  Negative for weakness and numbness.  ?All  other systems reviewed and are negative. ? ?Physical Exam ?Updated Vital Signs ?BP 139/81 (BP Location: Right Arm)   Pulse 60   Temp 98 ?F (36.7 ?C) (Oral)   Resp 18   Ht '5\' 2"'$  (1.575 m)   Wt 107 kg   LMP  (LMP Unknown)   SpO2 100%   BMI 43.16 kg/m?  ?Physical Exam ?Constitutional:   ?   Appearance: She is well-developed.  ?HENT:  ?   Head: Atraumatic.  ?Cardiovascular:  ?   Comments: Pulses equal bilaterally ?Musculoskeletal:     ?   General: Tenderness present.  ?   Cervical back: Normal range of motion.  ?Skin: ?   General: Skin is warm and  dry.  ?Neurological:  ?   Mental Status: She is alert.  ?   Sensory: No sensory deficit.  ?   Motor: No weakness.  ?   Deep Tendon Reflexes: Reflexes normal.  ? ? ?ED Results / Procedures / Treatments   ?Labs ?(all labs ordered are listed, but only abnormal results are displayed) ?Labs Reviewed  ?CBC WITH DIFFERENTIAL/PLATELET - Abnormal; Notable for the following components:  ?    Result Value  ? Hemoglobin 10.3 (*)   ? HCT 33.1 (*)   ? MCH 25.4 (*)   ? RDW 18.7 (*)   ? All other components within normal limits  ?BASIC METABOLIC PANEL - Abnormal; Notable for the following components:  ? CO2 18 (*)   ? BUN 57 (*)   ? Creatinine, Ser 5.43 (*)   ? GFR, Estimated 9 (*)   ? All other components within normal limits  ? ? ?EKG ?None ? ?Radiology ?DG Finger Middle Right ? ?Result Date: 07/01/2021 ?CLINICAL DATA:  Middle finger swelling. EXAM: RIGHT MIDDLE FINGER 2+V COMPARISON:  01/13/2021 FINDINGS: Diffuse soft tissue swelling in the middle finger, particularly along the proximal aspect. Negative for an acute fracture or dislocation. Mild joint space narrowing and degenerative changes at the PIP joint. No evidence for a radiopaque foreign body. IMPRESSION: 1. Soft tissue swelling in the right middle finger without acute bone abnormality. 2. Minimal joint space narrowing and osteoarthritis. Electronically Signed   By: Markus Daft M.D.   On: 07/01/2021 10:05   ? ?Procedures ?Procedures  ? ? ?Medications Ordered in ED ?Medications  ?colchicine tablet 0.6 mg (0.6 mg Oral Given 07/01/21 0938)  ?colchicine tablet 1.2 mg (1.2 mg Oral Given 07/01/21 1106)  ?cephALEXin (KEFLEX) capsule 500 mg (500 mg Oral Given 07/01/21 1106)  ? ? ?ED Course/ Medical Decision Making/ A&P ?  ?                        ?Medical Decision Making ?Patient with acute pain and swelling at her right long finger which appears to be predominantly affecting the MIP joint.  It is not hot to touch, she is able to flex and extend but flexion is limited secondary to  swelling.  There is no red streaking, her skin is intact.  This is less likely to be an infection and more likely that this is a gout flare, although patient does not recognize it as such since she has never had it in her hand before.  Labs and imaging are reassuring. ? ?Amount and/or Complexity of Data Reviewed ?Labs: ordered. ?   Details: Labs reviewed, normal WBC count.  Her creatinine today is 5.43.  She states at her last nephrology visit it was over 8. ?Radiology: ordered  and independent interpretation performed. ?   Details: X-rays were viewed.  No 3 noted, no joint effusion.  Arthritis is present. ? ?Risk ?Prescription drug management. ?Risk Details: Patient will be treated for gout, she endorses that she is out of her colchicine, she was prescribed this medication.  We will also cover her for infection although I feel this is less likely the source of her symptoms.  She was advised close follow-up, she states her PCP is leaving, and is unsure when she will be able to see a new PCP, she was given referral to orthopedics for this reason for as needed follow-up if her symptoms persist.  She was also advised to return here for any worsening symptoms. ? ? ? ? ? ? ? ? ? ? ?Final Clinical Impression(s) / ED Diagnoses ?Final diagnoses:  ?Acute gout of right hand, unspecified cause  ? ? ?Rx / DC Orders ?ED Discharge Orders   ? ?      Ordered  ?  cephALEXin (KEFLEX) 500 MG capsule  2 times daily       ? 07/01/21 1056  ?  HYDROcodone-acetaminophen (NORCO/VICODIN) 5-325 MG tablet  Every 4 hours PRN       ? 07/01/21 1056  ?  colchicine 0.6 MG tablet  2 times daily PRN       ? 07/01/21 1118  ? ?  ?  ? ?  ? ? ?  ?Evalee Jefferson, PA-C ?07/01/21 1122 ? ?  ?Luna Fuse, MD ?07/12/21 1641 ? ?

## 2021-07-01 NOTE — ED Triage Notes (Addendum)
"  Right middle finger swelling and painful x 2 days" per pt  Denies injury ?

## 2021-07-02 ENCOUNTER — Telehealth: Payer: Self-pay

## 2021-07-02 NOTE — Telephone Encounter (Signed)
Transition Care Management Unsuccessful Follow-up Telephone Call ? ?Date of discharge and from where:  07/01/2021-[facility]  ? ?Attempts:  1st Attempt ? ?Reason for unsuccessful TCM follow-up call:  Left voice message ? ?  ?

## 2021-07-03 NOTE — Telephone Encounter (Signed)
Transition Care Management Follow-up Telephone Call ?Date of discharge and from where: 07/01/2021-[facility]  ?How have you been since you were released from the hospital? Pt stated she is doing ok.  ?Any questions or concerns? No ? ?Items Reviewed: ?Did the pt receive and understand the discharge instructions provided? Yes  ?Medications obtained and verified? Yes  ?Other? No  ?Any new allergies since your discharge? No  ?Dietary orders reviewed? No ?Do you have support at home? Yes  ? ?Home Care and Equipment/Supplies: ?Were home health services ordered? not applicable ?If so, what is the name of the agency? N/A  ?Has the agency set up a time to come to the patient's home? not applicable ?Were any new equipment or medical supplies ordered?  No ?What is the name of the medical supply agency? N/A ?Were you able to get the supplies/equipment? not applicable ?Do you have any questions related to the use of the equipment or supplies? No ? ?Functional Questionnaire: (I = Independent and D = Dependent) ?ADLs: I ? ?Bathing/Dressing- I ? ?Meal Prep- I ? ?Eating- I ? ?Maintaining continence- I ? ?Transferring/Ambulation- I ? ?Managing Meds- I ? ?Follow up appointments reviewed: ? ?PCP Hospital f/u appt confirmed? Yes  Scheduled to see PCP on 08/15/2021. ?[facility] f/u appt confirmed? No   ?Are transportation arrangements needed? No  ?If their condition worsens, is the pt aware to call PCP or go to the Emergency Dept.? Yes ?Was the patient provided with contact information for the PCP's office or ED? Yes ?Was to pt encouraged to call back with questions or concerns? Yes  ?

## 2021-07-04 DIAGNOSIS — G4733 Obstructive sleep apnea (adult) (pediatric): Secondary | ICD-10-CM | POA: Diagnosis not present

## 2021-07-05 ENCOUNTER — Ambulatory Visit
Admission: RE | Admit: 2021-07-05 | Discharge: 2021-07-05 | Disposition: A | Discharge status: Home / Self Care | Payer: Medicaid Other | Source: Ambulatory Visit | Attending: Nurse Practitioner | Admitting: Nurse Practitioner

## 2021-07-05 ENCOUNTER — Other Ambulatory Visit: Payer: Self-pay

## 2021-07-05 DIAGNOSIS — Z0000 Encounter for general adult medical examination without abnormal findings: Secondary | ICD-10-CM

## 2021-07-05 DIAGNOSIS — Z1231 Encounter for screening mammogram for malignant neoplasm of breast: Secondary | ICD-10-CM | POA: Diagnosis not present

## 2021-07-12 ENCOUNTER — Encounter: Payer: Self-pay | Admitting: Emergency Medicine

## 2021-07-12 ENCOUNTER — Ambulatory Visit
Admission: EM | Admit: 2021-07-12 | Discharge: 2021-07-12 | Disposition: A | Discharge status: Home / Self Care | Payer: Medicaid Other | Attending: Urgent Care | Admitting: Urgent Care

## 2021-07-12 ENCOUNTER — Other Ambulatory Visit: Payer: Self-pay

## 2021-07-12 DIAGNOSIS — F1290 Cannabis use, unspecified, uncomplicated: Secondary | ICD-10-CM

## 2021-07-12 DIAGNOSIS — R197 Diarrhea, unspecified: Secondary | ICD-10-CM | POA: Diagnosis not present

## 2021-07-12 DIAGNOSIS — J4530 Mild persistent asthma, uncomplicated: Secondary | ICD-10-CM | POA: Diagnosis not present

## 2021-07-12 DIAGNOSIS — E119 Type 2 diabetes mellitus without complications: Secondary | ICD-10-CM

## 2021-07-12 DIAGNOSIS — J988 Other specified respiratory disorders: Secondary | ICD-10-CM

## 2021-07-12 DIAGNOSIS — B9789 Other viral agents as the cause of diseases classified elsewhere: Secondary | ICD-10-CM

## 2021-07-12 DIAGNOSIS — N184 Chronic kidney disease, stage 4 (severe): Secondary | ICD-10-CM | POA: Diagnosis not present

## 2021-07-12 DIAGNOSIS — R052 Subacute cough: Secondary | ICD-10-CM

## 2021-07-12 DIAGNOSIS — R0602 Shortness of breath: Secondary | ICD-10-CM | POA: Diagnosis not present

## 2021-07-12 DIAGNOSIS — R112 Nausea with vomiting, unspecified: Secondary | ICD-10-CM | POA: Diagnosis not present

## 2021-07-12 MED ORDER — PREDNISONE 20 MG PO TABS: ORAL_TABLET | ORAL | 0 refills | Status: DC

## 2021-07-12 MED ORDER — BENZONATATE 100 MG PO CAPS: 100.0000 mg | ORAL_CAPSULE | Freq: Three times a day (TID) | ORAL | 0 refills | Status: DC | PRN

## 2021-07-12 MED ORDER — PROMETHAZINE-DM 6.25-15 MG/5ML PO SYRP: 5.0000 mL | ORAL_SOLUTION | Freq: Every evening | ORAL | 0 refills | Status: DC | PRN

## 2021-07-12 NOTE — ED Provider Notes (Signed)
?[facility] ? ? ?MRN: [id number] DOB: [birth year] ? ?Subjective:  ? ?Margaret Aguilar is a 61 y.o. female presenting for 4 day history of persistent malaise and fatigue, coughing, sinus congestion, runny nose, intermittent nausea, vomiting and diarrhea. Has a history of asthma. No smoking cigarettes. Smokes marijuana 1-2 times weekly. Has a history of CKD IV, still makes urine. Has type 2 diabetes treated without insulin.  ? ?No current facility-administered medications for this encounter. ? ?Current Outpatient Medications:  ?  albuterol (PROVENTIL) (2.5 MG/3ML) 0.083% nebulizer solution, Take 3 mLs (2.5 mg total) by nebulization every 6 (six) hours as needed for wheezing or shortness of breath., Disp: 150 mL, Rfl: 11 ?  albuterol (VENTOLIN HFA) 108 (90 Base) MCG/ACT inhaler, Inhale 2 puffs into the lungs every 6 (six) hours as needed for wheezing or shortness of breath., Disp: 8 g, Rfl: 0 ?  allopurinol (ZYLOPRIM) 100 MG tablet, Take 100 mg by mouth daily., Disp: , Rfl:  ?  amLODipine (NORVASC) 10 MG tablet, Take 10 mg by mouth daily., Disp: , Rfl:  ?  calcitRIOL (ROCALTROL) 0.25 MCG capsule, Take 0.25 mcg by mouth daily., Disp: , Rfl:  ?  colchicine 0.6 MG tablet, Take 1 tablet (0.6 mg total) by mouth 2 (two) times daily as needed (gout flare)., Disp: 30 tablet, Rfl: 0 ?  furosemide (LASIX) 40 MG tablet, Take 40 mg by mouth daily., Disp: , Rfl:  ?  HYDROcodone-acetaminophen (NORCO/VICODIN) 5-325 MG tablet, Take 1 tablet by mouth every 4 (four) hours as needed., Disp: 20 tablet, Rfl: 0 ?  isosorbide-hydrALAZINE (BIDIL) 20-37.5 MG tablet, Take by mouth 2 (two) times daily., Disp: , Rfl:  ?  metoprolol succinate (TOPROL-XL) 100 MG 24 hr tablet, Take 50 mg by mouth daily. Take with or immediately following a meal., Disp: , Rfl:  ?  montelukast (SINGULAIR) 10 MG tablet, Take 1 tablet (10 mg total) by mouth at bedtime., Disp: 90 tablet, Rfl: 3  ? ?Allergies  ?Allergen Reactions  ? Nutritional  Supplements   ?  Maben >> UNSPECIFIED REACTION  ?Severity from [place]  ? Latex Swelling and Rash  ?  SWELLING REACTION UNSPECIFIED   ? ? ?Past Medical History:  ?Diagnosis Date  ? Anemia   ? Anxiety   ? Arthritis   ? Asthma   ? Back pain   ? Bradycardia   ? Depression   ? Diabetes ([place])   ? Diabetes mellitus without complication ([place])   ? x 5 yrs  ? Drug use   ? GERD (gastroesophageal reflux disease)   ? Gout   ? Herpes   ? High cholesterol   ? HLD (hyperlipidemia)   ? Hypertension   ? Joint pain   ? Kidney disease   ? Stage 4  ? 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 Palpitations   ? Pneumonia   ? several  ? Sleep apnea   ? hasn't gotten cpap yet  ? SOB (shortness of breath)   ? Vitamin D-dependent rickets   ?  ? ?Past Surgical History:  ?Procedure Laterality Date  ? AV FISTULA PLACEMENT Left 10/19/2018  ? Procedure: LEFT ARTERIOVENOUS (AV) FISTULA CREATION;  Surgeon: Waynetta Sandy, MD;  Location: [facility];  Service: Vascular;  Laterality: Left;  ? BASCILIC VEIN TRANSPOSITION Left 06/30/2019  ? Procedure: Bascilic Vein Transposition;  Surgeon: Waynetta Sandy, MD;  Location: [facility];  Service: Vascular;  Laterality: Left;  ? CESAREAN SECTION    ?  x 1  ? COLONOSCOPY N/A 01/12/2014  ? Procedure: COLONOSCOPY;  Surgeon: Rogene Houston, MD;  [facility];  Service: Endoscopy;  Laterality: N/A;  225  ? D&C    ? TUBAL LIGATION    ? ? ?Family History  ?Problem Relation Age of Onset  ? Diabetes Mother   ? Hyperlipidemia Mother   ? Depression Mother   ? Anxiety disorder Mother   ? Sleep apnea Mother   ? Eating disorder Mother   ? Obesity Mother   ? Hyperlipidemia Father   ? Hypertension Father   ? Heart disease Father   ? Sleep apnea Father   ? Alcoholism Father   ? Drug abuse Father   ? Stroke Brother   ? Diabetes Brother   ? ? ?Social History  ? ?Tobacco Use  ? Smoking status: Never  ? Smokeless tobacco: Never  ?Vaping Use  ? Vaping Use: Never used  ?Substance Use  Topics  ? Alcohol use: No  ? Drug use: Yes  ?  Types: Marijuana  ? ? ?ROS ? ? ?Objective:  ? ?Vitals: ?BP (!) 197/91 (BP Location: Right Arm)   Pulse 70   Temp 98.6 ?F (37 ?C) (Oral)   Resp (!) 22   Ht '5\' 2"'  (1.575 m)   Wt 232 lb (105.2 kg)   LMP  (LMP Unknown)   SpO2 97%   BMI 42.43 kg/m?  ? ?BP Readings from Last 3 Encounters:  ?07/12/21 (!) 197/91  ?07/01/21 139/81  ?06/18/21 (!) 196/101  ? ?Physical Exam ?Constitutional:   ?   General: She is not in acute distress. ?   Appearance: Normal appearance. She is well-developed. She is obese. She is not ill-appearing, toxic-appearing or diaphoretic.  ?HENT:  ?   Head: Normocephalic and atraumatic.  ?   Right Ear: External ear normal.  ?   Left Ear: External ear normal.  ?   Nose: Congestion present. No rhinorrhea.  ?   Mouth/Throat:  ?   Mouth: Mucous membranes are moist.  ?   Pharynx: No oropharyngeal exudate or posterior oropharyngeal erythema.  ?Eyes:  ?   General: No scleral icterus.    ?   Right eye: No discharge.     ?   Left eye: No discharge.  ?   Extraocular Movements: Extraocular movements intact.  ?   Conjunctiva/sclera: Conjunctivae normal.  ?Cardiovascular:  ?   Rate and Rhythm: Normal rate.  ?   Heart sounds: No murmur heard. ?  No friction rub. No gallop.  ?Pulmonary:  ?   Effort: Pulmonary effort is normal. No respiratory distress.  ?   Breath sounds: No stridor. No wheezing, rhonchi or rales.  ?Chest:  ?   Chest wall: No tenderness.  ?Skin: ?   General: Skin is warm and dry.  ?Neurological:  ?   General: No focal deficit present.  ?   Mental Status: She is alert and oriented to person, place, and time.  ?Psychiatric:     ?   Mood and Affect: Mood normal.     ?   Behavior: Behavior normal.  ? ? ?Assessment and Plan :  ? ?PDMP not reviewed this encounter. ? ?1. Subacute cough   ?2. Shortness of breath   ?3. Nausea vomiting and diarrhea   ?4. Chronic kidney disease (CKD), stage IV (severe) (HCC)   ?5. Type 2 diabetes mellitus treated without  insulin ([place])   ?6. Marijuana use   ?7. Mild persistent asthma  without complication   ? ?Suspect an acute viral syndrome, likely worsened by her asthma.  Offered an oral prednisone course.  Recommended supportive care safe with her CKD 4.  COVID and flu tests are pending.  Patient has clear pulmonary lung sounds and therefore deferred imaging. Counseled patient on potential for adverse effects with medications prescribed/recommended today, ER and return-to-clinic precautions discussed, patient verbalized understanding. ? ?  ?Jaynee Eagles, PA-C ?07/12/21 1549 ? ?

## 2021-07-12 NOTE — ED Triage Notes (Addendum)
Pt reports cough, fever, runny nose, intermittent emesis/diarrhea x4 days. Pt reports "cough has been so bad I haven't been able to sleep with my cipap machine". ? ?Home covid test yesterday with negative result. ?

## 2021-07-14 LAB — COVID-19, FLU A+B NAA
Influenza A, NAA: NOT DETECTED
Influenza B, NAA: NOT DETECTED
SARS-CoV-2, NAA: NOT DETECTED

## 2021-08-04 DIAGNOSIS — G4733 Obstructive sleep apnea (adult) (pediatric): Secondary | ICD-10-CM | POA: Diagnosis not present

## 2021-08-09 DIAGNOSIS — N189 Chronic kidney disease, unspecified: Secondary | ICD-10-CM | POA: Diagnosis not present

## 2021-08-09 DIAGNOSIS — M109 Gout, unspecified: Secondary | ICD-10-CM | POA: Diagnosis not present

## 2021-08-09 DIAGNOSIS — N2581 Secondary hyperparathyroidism of renal origin: Secondary | ICD-10-CM | POA: Diagnosis not present

## 2021-08-09 DIAGNOSIS — Z6841 Body Mass Index (BMI) 40.0 and over, adult: Secondary | ICD-10-CM | POA: Diagnosis not present

## 2021-08-09 DIAGNOSIS — I770 Arteriovenous fistula, acquired: Secondary | ICD-10-CM | POA: Diagnosis not present

## 2021-08-09 DIAGNOSIS — N185 Chronic kidney disease, stage 5: Secondary | ICD-10-CM | POA: Diagnosis not present

## 2021-08-09 DIAGNOSIS — E1129 Type 2 diabetes mellitus with other diabetic kidney complication: Secondary | ICD-10-CM | POA: Diagnosis not present

## 2021-08-09 DIAGNOSIS — I120 Hypertensive chronic kidney disease with stage 5 chronic kidney disease or end stage renal disease: Secondary | ICD-10-CM | POA: Diagnosis not present

## 2021-08-09 DIAGNOSIS — D631 Anemia in chronic kidney disease: Secondary | ICD-10-CM | POA: Diagnosis not present

## 2021-08-15 ENCOUNTER — Ambulatory Visit: Payer: Self-pay | Admitting: Nurse Practitioner

## 2021-08-15 ENCOUNTER — Encounter: Payer: Self-pay | Admitting: Nurse Practitioner

## 2021-08-15 ENCOUNTER — Ambulatory Visit: Payer: Medicaid Other | Admitting: Nurse Practitioner

## 2021-08-15 VITALS — BP 101/60 | HR 58 | Temp 97.2°F | Ht 62.0 in | Wt 231.8 lb

## 2021-08-15 DIAGNOSIS — I10 Essential (primary) hypertension: Secondary | ICD-10-CM

## 2021-08-15 DIAGNOSIS — R112 Nausea with vomiting, unspecified: Secondary | ICD-10-CM

## 2021-08-15 DIAGNOSIS — K5900 Constipation, unspecified: Secondary | ICD-10-CM

## 2021-08-15 MED ORDER — AMLODIPINE BESYLATE 10 MG PO TABS: 10.0000 mg | ORAL_TABLET | Freq: Every day | ORAL | 2 refills | Status: DC

## 2021-08-15 MED ORDER — FUROSEMIDE 40 MG PO TABS: 40.0000 mg | ORAL_TABLET | Freq: Every day | ORAL | 2 refills | Status: DC

## 2021-08-15 MED ORDER — ONDANSETRON HCL 4 MG PO TABS: 4.0000 mg | ORAL_TABLET | Freq: Three times a day (TID) | ORAL | 0 refills | Status: DC | PRN

## 2021-08-15 MED ORDER — METOPROLOL SUCCINATE ER 100 MG PO TB24: 50.0000 mg | ORAL_TABLET | Freq: Every day | ORAL | 2 refills | Status: DC

## 2021-08-15 MED ORDER — DOCUSATE SODIUM 100 MG PO CAPS: 100.0000 mg | ORAL_CAPSULE | Freq: Two times a day (BID) | ORAL | 0 refills | Status: DC

## 2021-08-15 NOTE — Patient Instructions (Signed)
1. Essential hypertension ? ?- amLODipine (NORVASC) 10 MG tablet; Take 1 tablet (10 mg total) by mouth daily.  Dispense: 30 tablet; Refill: 2 ?- furosemide (LASIX) 40 MG tablet; Take 1 tablet (40 mg total) by mouth daily.  Dispense: 30 tablet; Refill: 2 ?- metoprolol succinate (TOPROL-XL) 100 MG 24 hr tablet; Take 0.5 tablets (50 mg total) by mouth daily. Take with or immediately following a meal.  Dispense: 15 tablet; Refill: 2 ? ?2. Nausea and vomiting, unspecified vomiting type ? ?- ondansetron (ZOFRAN) 4 MG tablet; Take 1 tablet (4 mg total) by mouth every 8 (eight) hours as needed for nausea or vomiting.  Dispense: 20 tablet; Refill: 0 ? ?3. Constipation, unspecified constipation type ? ?- docusate sodium (COLACE) 100 MG capsule; Take 1 capsule (100 mg total) by mouth 2 (two) times daily.  Dispense: 10 capsule; Refill: 0 ? ? ?Follow up: ? ?Follow up in 3 months or sooner if needed ? ?

## 2021-08-15 NOTE — Progress Notes (Signed)
'@Patient'  ID: Margaret Aguilar, female    DOB: 05/19/1960, 61 y.o.   MRN: [id number] ? ?Chief Complaint  ?Patient presents with  ? Follow-up  ?  Patient is here are for her 3 month follow up visit and to discuss the pain she is having right side since this morning. Patient is supposed to be starting dialysis soon and is not feeling well today with nausea and vomiting this morning.  ? ? ?Referring provider: ?[name], NP ? ? ?HPI ? ?Margaret Aguilar presents for follow up. She has been lost to follow up. 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  ? ?taking medications as instructed, no medication side effects noted, no TIA's, no chest pain on exertion, no dyspnea on exertion, and no swelling of ankles. She has been diagnosed with Stage 5 CKD related to non adherence to treatment. 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 She reports having a fistula placed however no treatment has been started. She mentioned that her husband in going to donate her a kidney. The current prescribed treatment is furosemide 40 mg daily, metoprolol XL 50 mg  Bidil 20/37.5 mg BID and amlodipine 10 mg. ? ?New concerns: Patient has been having nausea and vomiting this morning. She was given some ginerale in office. States that she is starting to feel better. Will order zofran. Patient states that she has been having issues with constipation. Patient did have a BM yesterday. Will order colace. ? ? ?Allergies  ?Allergen Reactions  ? Nutritional Supplements   ?  [place] >> UNSPECIFIED REACTION  ?Severity  from [place]  ? Latex Swelling and Rash  ?  SWELLING REACTION UNSPECIFIED   ? ? ?Immunization History  ?Administered Date(s) Administered  ? Influenza,inj,Quad PF,6+ Mos 01/12/2018  ? Moderna Sars-Covid-2 Vaccination 12/12/2019  ? PPD Test 09/28/2013, 10/12/2013  ? Tdap 12/29/2019  ? ? ?Past Medical History:  ?Diagnosis Date  ? Anemia   ? Anxiety   ? Arthritis   ? Asthma   ? Back pain   ? Bradycardia   ? Depression   ? Diabetes ([place])   ? Diabetes mellitus without complication ([place])   ? x 5 yrs  ? Drug use   ? GERD (gastroesophageal reflux disease)   ? Gout   ? Herpes   ? High cholesterol   ? HLD (hyperlipidemia)   ? Hypertension   ? Joint pain   ? Kidney disease   ? Stage 4  ? 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 Palpitations   ? Pneumonia   ? several  ? Sleep apnea   ? hasn't gotten cpap yet  ? SOB (shortness of breath)   ? Vitamin D-dependent rickets   ? ? ?Tobacco History: ?Social History  ? ?Tobacco Use  ?Smoking Status Never  ?Smokeless Tobacco Never  ? ?Counseling given: Not Answered ? ? ?Outpatient Encounter Medications as of 08/15/2021  ?Medication Sig  ? albuterol (PROVENTIL) (2.5 MG/3ML) 0.083% nebulizer solution Take 3 mLs (2.5 mg total) by nebulization every 6 (six) hours as needed for wheezing or shortness of breath.  ? albuterol (VENTOLIN HFA) 108 (90 Base) MCG/ACT inhaler  Inhale 2 puffs into the lungs every 6 (six) hours as needed for wheezing or shortness of breath.  ? allopurinol (ZYLOPRIM) 100 MG tablet Take 100 mg by mouth daily.  ? benzonatate (TESSALON) 100 MG capsule Take 1-2 capsules (100-200 mg total) by mouth 3 (three) times daily as needed for cough.  ? calcitRIOL (ROCALTROL) 0.25 MCG capsule Take 0.25 mcg by mouth daily.  ? colchicine 0.6 MG tablet Take 1 tablet (0.6 mg total) by mouth 2 (two) times daily as needed (gout flare).  ? docusate sodium (COLACE) 100 MG capsule Take 1 capsule (100 mg total) by mouth 2 (two) times daily.  ? HYDROcodone-acetaminophen  (NORCO/VICODIN) 5-325 MG tablet Take 1 tablet by mouth every 4 (four) hours as needed.  ? isosorbide-hydrALAZINE (BIDIL) 20-37.5 MG tablet Take by mouth 2 (two) times daily.  ? ondansetron (ZOFRAN) 4 MG tablet Take 1 tablet (4 mg total) by mouth every 8 (eight) hours as needed for nausea or vomiting.  ? predniSONE (DELTASONE) 20 MG tablet Take 2 tablets daily with breakfast.  ? promethazine-dextromethorphan (PROMETHAZINE-DM) 6.25-15 MG/5ML syrup Take 5 mLs by mouth at bedtime as needed for cough.  ? [DISCONTINUED] amLODipine (NORVASC) 10 MG tablet Take 10 mg by mouth daily.  ? [DISCONTINUED] furosemide (LASIX) 40 MG tablet Take 40 mg by mouth daily.  ? [DISCONTINUED] metoprolol succinate (TOPROL-XL) 100 MG 24 hr tablet Take 50 mg by mouth daily. Take with or immediately following a meal.  ? amLODipine (NORVASC) 10 MG tablet Take 1 tablet (10 mg total) by mouth daily.  ? furosemide (LASIX) 40 MG tablet Take 1 tablet (40 mg total) by mouth daily.  ? metoprolol succinate (TOPROL-XL) 100 MG 24 hr tablet Take 0.5 tablets (50 mg total) by mouth daily. Take with or immediately following a meal.  ? montelukast (SINGULAIR) 10 MG tablet Take 1 tablet (10 mg total) by mouth at bedtime.  ? ?No facility-administered encounter medications on file as of 08/15/2021.  ? ? ? ?Review of Systems ? ?Review of Systems  ?Constitutional: Negative.   ?HENT: Negative.    ?Cardiovascular: Negative.   ?Gastrointestinal:  Positive for constipation, nausea and vomiting. Negative for abdominal distention.  ?Allergic/Immunologic: Negative.   ?Neurological: Negative.   ?Psychiatric/Behavioral: Negative.     ? ? ? ?Physical Exam ? ?BP 101/60   Pulse (!) 58   Temp (!) 97.2 ?F (36.2 ?C)   Ht '5\' 2"'  (1.575 m)   Wt 231 lb 12.8 oz (105.1 kg)   LMP  (LMP Unknown)   SpO2 98%   BMI 42.40 kg/m?  ? ?Wt Readings from Last 5 Encounters:  ?08/15/21 231 lb 12.8 oz (105.1 kg)  ?07/12/21 232 lb (105.2 kg)  ?07/01/21 236 lb (107 kg)  ?06/18/21 229 lb (103.9 kg)   ?06/11/21 226 lb (102.5 kg)  ? ? ? ?Physical Exam ?Vitals and nursing note reviewed.  ?Constitutional:   ?   General: She is not in acute distress. ?   Appearance: She is well-developed.  ?Cardiovascular:  ?   Rate and Rhythm: Normal rate and regular rhythm.  ?Pulmonary:  ?   Effort: Pulmonary effort is normal.  ?   Breath sounds: Normal breath sounds.  ?Neurological:  ?   Mental Status: She is alert and oriented to person, place, and time.  ? ? ? ?Lab Results: ? ?CBC ?   ?Component Value Date/Time  ? WBC 8.1 07/01/2021 0938  ? RBC 4.06 07/01/2021 0938  ? HGB 10.3 (L) 07/01/2021 0947  ?  HGB 11.1 01/12/2018 1237  ? HCT 33.1 (L) 07/01/2021 5894  ? HCT 36.0 01/12/2018 1237  ? PLT 222 07/01/2021 0938  ? PLT 320 01/12/2018 1237  ? MCV 81.5 07/01/2021 0938  ? MCV 73 (L) 01/12/2018 1237  ? MCH 25.4 (L) 07/01/2021 8347  ? MCHC 31.1 07/01/2021 0938  ? RDW 18.7 (H) 07/01/2021 5830  ? RDW 17.0 (H) 01/12/2018 1237  ? LYMPHSABS 1.6 07/01/2021 0938  ? LYMPHSABS 2.3 01/12/2018 1237  ? MONOABS 0.5 07/01/2021 0938  ? EOSABS 0.2 07/01/2021 0938  ? EOSABS 0.2 01/12/2018 1237  ? BASOSABS 0.0 07/01/2021 0938  ? BASOSABS 0.0 01/12/2018 1237  ? ? ?BMET ?   ?Component Value Date/Time  ? NA 139 07/01/2021 0938  ? NA 143 01/12/2018 1237  ? K 3.9 07/01/2021 0938  ? CL 109 07/01/2021 0938  ? CO2 18 (L) 07/01/2021 7460  ? GLUCOSE 97 07/01/2021 0938  ? BUN 57 (H) 07/01/2021 0298  ? BUN 28 (H) 01/12/2018 1237  ? CREATININE 5.43 (H) 07/01/2021 4730  ? CALCIUM 8.9 07/01/2021 0938  ? GFRNONAA 9 (L) 07/01/2021 8569  ? GFRAA 17 (L) 08/16/2018 1307  ? ? ?BNP ?No results found for: BNP ? ?ProBNP ?No results found for: PROBNP ? ?Imaging: ?No results found. ? ? ?Assessment & Plan:  ? ?Essential hypertension ?- amLODipine (NORVASC) 10 MG tablet; Take 1 tablet (10 mg total) by mouth daily.  Dispense: 30 tablet; Refill: 2 ?- furosemide (LASIX) 40 MG tablet; Take 1 tablet (40 mg total) by mouth daily.  Dispense: 30 tablet; Refill: 2 ?- metoprolol succinate  (TOPROL-XL) 100 MG 24 hr tablet; Take 0.5 tablets (50 mg total) by mouth daily. Take with or immediately following a meal.  Dispense: 15 tablet; Refill: 2 ? ?2. Nausea and vomiting, unspecified vomiting type ?

## 2021-08-15 NOTE — Assessment & Plan Note (Signed)
-   amLODipine (NORVASC) 10 MG tablet; Take 1 tablet (10 mg total) by mouth daily.  Dispense: 30 tablet; Refill: 2 ?- furosemide (LASIX) 40 MG tablet; Take 1 tablet (40 mg total) by mouth daily.  Dispense: 30 tablet; Refill: 2 ?- metoprolol succinate (TOPROL-XL) 100 MG 24 hr tablet; Take 0.5 tablets (50 mg total) by mouth daily. Take with or immediately following a meal.  Dispense: 15 tablet; Refill: 2 ? ?2. Nausea and vomiting, unspecified vomiting type ? ?- ondansetron (ZOFRAN) 4 MG tablet; Take 1 tablet (4 mg total) by mouth every 8 (eight) hours as needed for nausea or vomiting.  Dispense: 20 tablet; Refill: 0 ? ?3. Constipation, unspecified constipation type ? ?- docusate sodium (COLACE) 100 MG capsule; Take 1 capsule (100 mg total) by mouth 2 (two) times daily.  Dispense: 10 capsule; Refill: 0 ? ? ?Follow up: ? ?Follow up in 3 months or sooner if needed ?

## 2021-09-02 ENCOUNTER — Encounter: Payer: Self-pay | Admitting: Nurse Practitioner

## 2021-09-03 DIAGNOSIS — G4733 Obstructive sleep apnea (adult) (pediatric): Secondary | ICD-10-CM | POA: Diagnosis not present

## 2021-09-10 ENCOUNTER — Encounter: Payer: Self-pay | Admitting: Adult Health

## 2021-09-10 ENCOUNTER — Encounter: Payer: Medicaid Other | Admitting: Adult Health

## 2021-10-04 DIAGNOSIS — G4733 Obstructive sleep apnea (adult) (pediatric): Secondary | ICD-10-CM | POA: Diagnosis not present

## 2021-10-24 ENCOUNTER — Telehealth: Payer: Self-pay | Admitting: Gastroenterology

## 2021-10-24 NOTE — Telephone Encounter (Signed)
Hi Dr. Havery Moros,  This patient is requesting a transfer of care to you.  She was last seen in 2015 at [facility] (procedure and path report in Epic).  Her husband is a patient of yours currently and she would like to be seen by you as well.  She was impressed with the way you treated your husband and wants the same treatment for herself.  Please let me know if you approve the transfer.  Thank you.

## 2021-10-25 NOTE — Telephone Encounter (Signed)
I'm happy to see her in the office for a new patient visit. Thanks

## 2021-11-06 ENCOUNTER — Telehealth: Payer: Self-pay | Admitting: Adult Health

## 2021-11-06 NOTE — Telephone Encounter (Addendum)
Pt has not been seen here for her initial cpap follow up 09-10-2021 (no show) but she did meet compliance , see report below.  I will make f/u appt if ok.  I called [facility] spoke to Daryl, and he will contact me back about appt.

## 2021-11-06 NOTE — Telephone Encounter (Signed)
Pt called to schedule new auto pap user appointment. Pt 30-91 day(appt needed 07-20-21 through 09-18-21). Informed Pt need to discuss with nurse due to scheduling out of compliance window.

## 2021-11-06 NOTE — Telephone Encounter (Signed)
Jearld Fenton Dotson called me back from [facility].  He stated that should be ok to get her compliance initial cpap follow up appt scheduled as medicaid will give 6 months time frame for this.  (Should be ok).  I made appt for her 11-14-2021 at 1415 mychart VV).  This pt is able to get her cpap DL remotely.  She appreciated call back.

## 2021-11-08 ENCOUNTER — Encounter: Payer: Self-pay | Admitting: Nurse Practitioner

## 2021-11-11 DIAGNOSIS — N2581 Secondary hyperparathyroidism of renal origin: Secondary | ICD-10-CM | POA: Diagnosis not present

## 2021-11-11 DIAGNOSIS — I770 Arteriovenous fistula, acquired: Secondary | ICD-10-CM | POA: Diagnosis not present

## 2021-11-11 DIAGNOSIS — N185 Chronic kidney disease, stage 5: Secondary | ICD-10-CM | POA: Diagnosis not present

## 2021-11-11 DIAGNOSIS — E1129 Type 2 diabetes mellitus with other diabetic kidney complication: Secondary | ICD-10-CM | POA: Diagnosis not present

## 2021-11-11 DIAGNOSIS — Z6841 Body Mass Index (BMI) 40.0 and over, adult: Secondary | ICD-10-CM | POA: Diagnosis not present

## 2021-11-11 DIAGNOSIS — I120 Hypertensive chronic kidney disease with stage 5 chronic kidney disease or end stage renal disease: Secondary | ICD-10-CM | POA: Diagnosis not present

## 2021-11-11 DIAGNOSIS — D631 Anemia in chronic kidney disease: Secondary | ICD-10-CM | POA: Diagnosis not present

## 2021-11-13 ENCOUNTER — Encounter: Payer: Self-pay | Admitting: *Deleted

## 2021-11-14 ENCOUNTER — Telehealth: Payer: Medicaid Other | Admitting: Neurology

## 2021-11-15 ENCOUNTER — Other Ambulatory Visit: Payer: Self-pay | Admitting: Pharmacist

## 2021-11-15 ENCOUNTER — Ambulatory Visit: Payer: Medicaid Other | Admitting: Nurse Practitioner

## 2021-11-15 ENCOUNTER — Telehealth: Payer: Self-pay | Admitting: Pharmacist

## 2021-11-15 ENCOUNTER — Encounter: Payer: Self-pay | Admitting: Nurse Practitioner

## 2021-11-15 VITALS — BP 159/99 | HR 65 | Temp 97.4°F | Ht 62.0 in | Wt 230.5 lb

## 2021-11-15 DIAGNOSIS — E1122 Type 2 diabetes mellitus with diabetic chronic kidney disease: Secondary | ICD-10-CM

## 2021-11-15 DIAGNOSIS — J4520 Mild intermittent asthma, uncomplicated: Secondary | ICD-10-CM | POA: Diagnosis not present

## 2021-11-15 DIAGNOSIS — Z0000 Encounter for general adult medical examination without abnormal findings: Secondary | ICD-10-CM | POA: Diagnosis not present

## 2021-11-15 DIAGNOSIS — N184 Chronic kidney disease, stage 4 (severe): Secondary | ICD-10-CM

## 2021-11-15 DIAGNOSIS — I10 Essential (primary) hypertension: Secondary | ICD-10-CM | POA: Diagnosis not present

## 2021-11-15 MED ORDER — METOPROLOL SUCCINATE ER 100 MG PO TB24: 50.0000 mg | ORAL_TABLET | Freq: Every day | ORAL | 2 refills | Status: DC

## 2021-11-15 MED ORDER — AMLODIPINE BESYLATE 10 MG PO TABS: 10.0000 mg | ORAL_TABLET | Freq: Every day | ORAL | 2 refills | Status: DC

## 2021-11-15 MED ORDER — CARVEDILOL 12.5 MG PO TABS: 12.5000 mg | ORAL_TABLET | Freq: Two times a day (BID) | ORAL | 3 refills | Status: AC

## 2021-11-15 MED ORDER — ISOSORB DINITRATE-HYDRALAZINE 20-37.5 MG PO TABS: 1.0000 | ORAL_TABLET | Freq: Two times a day (BID) | ORAL | 2 refills | Status: AC

## 2021-11-15 MED ORDER — OZEMPIC (0.25 OR 0.5 MG/DOSE) 2 MG/3ML ~~LOC~~ SOPN: PEN_INJECTOR | SUBCUTANEOUS | 2 refills | Status: DC

## 2021-11-15 MED ORDER — MONTELUKAST SODIUM 10 MG PO TABS: 10.0000 mg | ORAL_TABLET | Freq: Every day | ORAL | 3 refills | Status: AC

## 2021-11-15 MED ORDER — FUROSEMIDE 40 MG PO TABS: 40.0000 mg | ORAL_TABLET | Freq: Every day | ORAL | 2 refills | Status: DC

## 2021-11-15 NOTE — Patient Instructions (Addendum)
1. Routine health maintenance  - HgB A1c - Lipid Panel - CBC - Comprehensive metabolic panel - Hemoglobin A1c  2. Essential hypertension  - amLODipine (NORVASC) 10 MG tablet; Take 1 tablet (10 mg total) by mouth daily.  Dispense: 30 tablet; Refill: 2 - furosemide (LASIX) 40 MG tablet; Take 1 tablet (40 mg total) by mouth daily.  Dispense: 30 tablet; Refill: 2 - metoprolol succinate (TOPROL-XL) 100 MG 24 hr tablet; Take 0.5 tablets (50 mg total) by mouth daily. Take with or immediately following a meal.  Dispense: 15 tablet; Refill: 2  3. Mild intermittent asthma without complication  - montelukast (SINGULAIR) 10 MG tablet; Take 1 tablet (10 mg total) by mouth at bedtime.  Dispense: 90 tablet; Refill: 3    Follow up:  Follow up in 3 months or sooner

## 2021-11-15 NOTE — Assessment & Plan Note (Signed)
-   HgB A1c - Lipid Panel - CBC - Comprehensive metabolic panel - Hemoglobin A1c  2. Essential hypertension  - amLODipine (NORVASC) 10 MG tablet; Take 1 tablet (10 mg total) by mouth daily.  Dispense: 30 tablet; Refill: 2 - furosemide (LASIX) 40 MG tablet; Take 1 tablet (40 mg total) by mouth daily.  Dispense: 30 tablet; Refill: 2 - metoprolol succinate (TOPROL-XL) 100 MG 24 hr tablet; Take 0.5 tablets (50 mg total) by mouth daily. Take with or immediately following a meal.  Dispense: 15 tablet; Refill: 2  3. Mild intermittent asthma without complication  - montelukast (SINGULAIR) 10 MG tablet; Take 1 tablet (10 mg total) by mouth at bedtime.  Dispense: 90 tablet; Refill: 3    Follow up:  Follow up in 3 months or sooner

## 2021-11-15 NOTE — Telephone Encounter (Signed)
PA approved for Ozempic for T2DM 11/15/21-11/15/22.  Order sent under PCP.   Attempted to call patient to notify, call went to voicemail and voicemail was full. Will send MyChart

## 2021-11-15 NOTE — Patient Instructions (Addendum)
Eilleen,   It was great talking to you today!  I left a message for [name] about Ozempic.   For your blood pressure, stop metoprolol and start carvedilol 12.5 mg twice daily. Continue amlodipine 10 mg daily and isosorbide/hydralazine 20/37.5 mg twice daily.   Check your blood pressure once daily, and any time you have concerning symptoms like headache, chest pain, dizziness, shortness of breath, or vision changes.   Our goal is less than 130/80.  To appropriately check your blood pressure, make sure you do the following:  1) Avoid caffeine, exercise, or tobacco products for 30 minutes before checking. Empty your bladder. 2) Sit with your back supported in a flat-backed chair. Rest your arm on something flat (arm of the chair, table, etc). 3) Sit still with your feet flat on the floor, resting, for at least 5 minutes.  4) Check your blood pressure. Take 1-2 readings.  5) Write down these readings and bring with you to any provider appointments.  Bring your home blood pressure machine with you to a provider's office for accuracy comparison at least once a year.   Make sure you take your blood pressure medications before you come to any office visit, even if you were asked to fast for labs.   Take care!  Catie Hedwig Morton, PharmD, [facility] [phone]

## 2021-11-15 NOTE — Addendum Note (Signed)
Addended by: Kaylyn Layer T on: 11/15/2021 03:22 PM   Modules accepted: Orders

## 2021-11-15 NOTE — Progress Notes (Signed)
'@Patient'  ID: Margaret Aguilar, female    DOB: October 30, 1960, 61 y.o.   MRN: [id number]  Chief Complaint  Patient presents with   Hypertension    Pt is here for 3 month's HTN follow up. visit.  Pt is requesting refill on all medications    Referring provider: [name], NP   HPI  Margaret Aguilar presents for follow up. She has been lost to follow up. 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    Patient is non compliant with medications, no medication side effects noted, no TIA's, no chest pain on exertion, no dyspnea on exertion, and no swelling of ankles. She has been diagnosed with Stage 5 CKD related to non adherence to treatment. 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 She reports having a fistula placed however no treatment has been started. Will need to loose 20 pounds before considered for kidney transplant. The current prescribed treatment is furosemide 40 mg daily, metoprolol XL 50 mg  Bidil 20/37.5 mg BID and amlodipine 10 mg. She is requesting ozempic for weight loss - will discuss this option with pharmacist and nephrologist. Patient states that she is getting depressed due to medical conditions. We will get her set up for counseling with Estanislado Emms here in the office. Denies f/c/s, n/v/d, hemoptysis, PND, leg swelling Denies chest pain or edema      Allergies  Allergen Reactions   Nutritional Supplements     [place] >> UNSPECIFIED REACTION  Severity from [place]   Latex Swelling and Rash    SWELLING REACTION UNSPECIFIED      Immunization History  Administered Date(s) Administered   Influenza,inj,Quad PF,6+ Mos 01/12/2018   Moderna Sars-Covid-2 Vaccination 12/12/2019   PPD Test 09/28/2013, 10/12/2013   Tdap 12/29/2019    Past Medical History:  Diagnosis Date   Anemia    Anxiety    Arthritis    Asthma    Back pain    Bradycardia    Depression    Diabetes ([place])    Diabetes mellitus without complication ([place])    x 5 yrs   Drug use    GERD (gastroesophageal reflux disease)    Gout    Herpes    High cholesterol    HLD (hyperlipidemia)    Hypertension    Joint pain    Kidney disease    Stage 4   Kidney disease, chronic, stage IV (severe, 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   albuterol (VENTOLIN HFA) 108 (90 Base) MCG/ACT inhaler Inhale 2 puffs into the lungs every 6 (six) hours as needed for wheezing or shortness  of breath.   allopurinol (ZYLOPRIM) 100 MG tablet Take 100 mg by mouth daily.   benzonatate (TESSALON) 100 MG capsule Take 1-2 capsules (100-200 mg total) by mouth 3 (three) times daily as needed for cough.   calcitRIOL (ROCALTROL) 0.25 MCG capsule Take 0.25 mcg by mouth daily.   colchicine 0.6 MG tablet Take 1 tablet (0.6 mg total) by mouth 2 (two) times daily as needed (gout flare).   docusate sodium (COLACE) 100 MG capsule Take 1 capsule (100 mg total) by mouth 2 (two) times daily.   HYDROcodone-acetaminophen (NORCO/VICODIN) 5-325 MG tablet Take 1 tablet by mouth every 4 (four)  hours as needed.   ondansetron (ZOFRAN) 4 MG tablet Take 1 tablet (4 mg total) by mouth every 8 (eight) hours as needed for nausea or vomiting.   promethazine-dextromethorphan (PROMETHAZINE-DM) 6.25-15 MG/5ML syrup Take 5 mLs by mouth at bedtime as needed for cough.   [DISCONTINUED] isosorbide-hydrALAZINE (BIDIL) 20-37.5 MG tablet Take by mouth 2 (two) times daily.   amLODipine (NORVASC) 10 MG tablet Take 1 tablet (10 mg total) by mouth daily.   furosemide (LASIX) 40 MG tablet Take 1 tablet (40 mg total) by mouth daily.   isosorbide-hydrALAZINE (BIDIL) 20-37.5 MG tablet Take 1 tablet by mouth 2 (two) times daily.   metoprolol succinate (TOPROL-XL) 100 MG 24 hr tablet Take 0.5 tablets (50 mg total) by mouth daily. Take with or immediately following a meal.   montelukast (SINGULAIR) 10 MG tablet Take 1 tablet (10 mg total) by mouth at bedtime.   predniSONE (DELTASONE) 20 MG tablet Take 2 tablets daily with breakfast. (Patient not taking: Reported on 11/15/2021)   [DISCONTINUED] amLODipine (NORVASC) 10 MG tablet Take 1 tablet (10 mg total) by mouth daily.   [DISCONTINUED] furosemide (LASIX) 40 MG tablet Take 1 tablet (40 mg total) by mouth daily.   [DISCONTINUED] metoprolol succinate (TOPROL-XL) 100 MG 24 hr tablet Take 0.5 tablets (50 mg total) by mouth daily. Take with or immediately following a meal.   [DISCONTINUED] montelukast (SINGULAIR) 10 MG tablet Take 1 tablet (10 mg total) by mouth at bedtime.   No facility-administered encounter medications on file as of 11/15/2021.     Review of Systems  Review of Systems  Constitutional: Negative.   HENT: Negative.    Cardiovascular: Negative.   Gastrointestinal: Negative.   Allergic/Immunologic: Negative.   Neurological: Negative.   Psychiatric/Behavioral: Negative.         Physical Exam  BP (!) 159/99 (BP Location: Right Arm, Patient Position: Sitting, Cuff Size: Large)   Pulse 65   Temp (!) 97.4 F (36.3 C)   Ht '5\' 2"'  (1.575 m)   Wt  230 lb 8 oz (104.6 kg)   LMP  (LMP Unknown)   SpO2 100%   BMI 42.16 kg/m   Wt Readings from Last 5 Encounters:  11/15/21 230 lb 8 oz (104.6 kg)  08/15/21 231 lb 12.8 oz (105.1 kg)  07/12/21 232 lb (105.2 kg)  07/01/21 236 lb (107 kg)  06/18/21 229 lb (103.9 kg)     Physical Exam Vitals and nursing note reviewed.  Constitutional:      General: She is not in acute distress.    Appearance: She is well-developed.  Cardiovascular:     Rate and Rhythm: Normal rate and regular rhythm.  Pulmonary:     Effort: Pulmonary effort is normal.     Breath sounds: Normal breath sounds.  Neurological:     Mental Status: She is alert and oriented to person,  place, and time.      Lab Results:  CBC    Component Value Date/Time   WBC 8.1 07/01/2021 0938   RBC 4.06 07/01/2021 0938   HGB 10.3 (L) 07/01/2021 0938   HGB 11.1 01/12/2018 1237   HCT 33.1 (L) 07/01/2021 0938   HCT 36.0 01/12/2018 1237   PLT 222 07/01/2021 0938   PLT 320 01/12/2018 1237   MCV 81.5 07/01/2021 0938   MCV 73 (L) 01/12/2018 1237   MCH 25.4 (L) 07/01/2021 0938   MCHC 31.1 07/01/2021 0938   RDW 18.7 (H) 07/01/2021 0938   RDW 17.0 (H) 01/12/2018 1237   LYMPHSABS 1.6 07/01/2021 0938   LYMPHSABS 2.3 01/12/2018 1237   MONOABS 0.5 07/01/2021 0938   EOSABS 0.2 07/01/2021 0938   EOSABS 0.2 01/12/2018 1237   BASOSABS 0.0 07/01/2021 0938   BASOSABS 0.0 01/12/2018 1237    BMET    Component Value Date/Time   NA 139 07/01/2021 0938   NA 143 01/12/2018 1237   K 3.9 07/01/2021 0938   CL 109 07/01/2021 0938   CO2 18 (L) 07/01/2021 0938   GLUCOSE 97 07/01/2021 0938   BUN 57 (H) 07/01/2021 0938   BUN 28 (H) 01/12/2018 1237   CREATININE 5.43 (H) 07/01/2021 0938   CALCIUM 8.9 07/01/2021 0938   GFRNONAA 9 (L) 07/01/2021 0938   GFRAA 17 (L) 08/16/2018 1307      Assessment & Plan:   Routine health maintenance - HgB A1c - Lipid Panel - CBC - Comprehensive metabolic panel - Hemoglobin A1c  2. Essential  hypertension  - amLODipine (NORVASC) 10 MG tablet; Take 1 tablet (10 mg total) by mouth daily.  Dispense: 30 tablet; Refill: 2 - furosemide (LASIX) 40 MG tablet; Take 1 tablet (40 mg total) by mouth daily.  Dispense: 30 tablet; Refill: 2 - metoprolol succinate (TOPROL-XL) 100 MG 24 hr tablet; Take 0.5 tablets (50 mg total) by mouth daily. Take with or immediately following a meal.  Dispense: 15 tablet; Refill: 2  3. Mild intermittent asthma without complication  - montelukast (SINGULAIR) 10 MG tablet; Take 1 tablet (10 mg total) by mouth at bedtime.  Dispense: 90 tablet; Refill: 3    Follow up:  Follow up in 3 months or sooner     [name], NP 11/15/2021

## 2021-11-15 NOTE — Chronic Care Management (AMB) (Addendum)
Chief Complaint  Patient presents with   Hypertension    Margaret Aguilar is a 61 y.o. year old female who presented for a telephone visit.   They were referred to the pharmacist by their PCP for assistance in managing hypertension.   Patient is participating in a Managed Medicaid Plan:  Yes  Subjective:   Medication Access/Adherence  Current Pharmacy:  [id number] [place], [id number]: [phone]: [address] [address]Phone: [phone]: [phone]   Patient reports affordability concerns with their medications: No  Patient reports access/transportation concerns to their pharmacy: No  Patient reports adherence concerns with their medications:  No     Diabetes:  Hx DM 6.6% Current medications: none right now, diet controlled Medications tried in the past:   Current meal patterns:  - Breakfast: ~ 1 pm: boiled eggs, bacon; cottage cheese; pineapples, granola;  - Supper: meat, vegetables, starch - Drinks: water, sometimes lemonade and   Current physical activity: 3 days a week: treadmill for 30 minutes; then leg machine   Patient reports she has 13 grandchildren and wants to qualify for kidney transplant to benefit her long term health. Reports she has been told goal weight for transplant is ~ 215 lbs. Reports she has lost 30 lbs on her own through diet/exercise, but has been unable to push past this plateau.  Hypertension:  Current medications: metoprolol succinate 50 mg daily, amlodipine 10 mg daily, isosorbide/hydralazine 20/37.5 mg twice daily- has been out of this medication for ~ 2 months   Patient has a validated, automated, upper arm home BP cuff Current blood pressure readings readings: reports prior to running out of isosorbide/hydralazine, BP was  ~130-140s/80-90s  Patient denies hypotensive s/sx including dizziness, lightheadedness.  Patient denies hypertensive symptoms including headache, chest pain, shortness of breath  Denies a history of heart failure or ASCVD.    Health Maintenance  Health Maintenance Due  Topic Date Due   FOOT EXAM  Never done   PAP SMEAR-Modifier  Never done   Zoster Vaccines- Shingrix (1 of 2) Never done   COVID-19 Vaccine (2 - Moderna series) 02/06/2020   OPHTHALMOLOGY EXAM  07/25/2020   INFLUENZA VACCINE  11/12/2021   HEMOGLOBIN A1C  11/12/2021     Objective: Lab Results  Component Value Date   HGBA1C 5.5 05/15/2021   HGBA1C 5.5 05/15/2021   HGBA1C 5.5 (A) 05/15/2021   HGBA1C 5.5 05/15/2021    Lab Results  Component Value Date   CREATININE 5.43 (H) 07/01/2021   BUN 57 (H) 07/01/2021   NA 139 07/01/2021   K 3.9 07/01/2021   CL 109 07/01/2021   CO2 18 (L) 07/01/2021    Lab Results  Component Value Date   CHOL 215 (H) 11/23/2019   HDL 46 11/23/2019   LDLCALC 143 (H) 11/23/2019   TRIG 146 11/23/2019   CHOLHDL 5.2 (H) 01/12/2018    Medications Reviewed Today     Reviewed by HOsker Mason RPH-CPP (Pharmacist) on 11/15/21 at 1137  Med List Status: <None>   Medication Order Taking? Sig Documenting Provider Last Dose Status Informant  albuterol (PROVENTIL) (2.5 MG/3ML) 0.083% nebulizer solution [id number] Take 3 mLs (2.5 mg total) by nebulization every 6 (six) hours as needed for wheezing or shortness of breath. KVevelyn Francois NP  Active  Self  albuterol (VENTOLIN HFA) 108 (90 Base) MCG/ACT inhaler [id number]  Inhale 2 puffs into the lungs every 6 (six) hours as needed for wheezing or shortness of breath. [name], NP  Active Self  allopurinol (ZYLOPRIM) 100 MG tablet [id number]  Take 100 mg by mouth daily. [provider]  Active Self  amLODipine (NORVASC) 10 MG tablet [id number] Yes Take 1 tablet (10 mg total) by mouth daily. [name], NP Taking Active    benzonatate (TESSALON) 100 MG capsule [id number]  Take 1-2 capsules (100-200 mg total) by mouth 3 (three) times daily as needed for cough. Jaynee Eagles, PA-C  Active   calcitRIOL (ROCALTROL) 0.25 MCG capsule [id number]  Take 0.25 mcg by mouth daily. [provider]  Active Self  carvedilol (COREG) 12.5 MG tablet [id number] Yes Take 1 tablet (12.5 mg total) by mouth 2 (two) times daily with a meal. [name], NP  Active   colchicine 0.6 MG tablet [id number]  Take 1 tablet (0.6 mg total) by mouth 2 (two) times daily as needed (gout flare). Evalee Jefferson, PA-C  Active   docusate sodium (COLACE) 100 MG capsule [id number]  Take 1 capsule (100 mg total) by mouth 2 (two) times daily. [name], NP  Active   furosemide (LASIX) 40 MG tablet [id number] Yes Take 1 tablet (40 mg total) by mouth daily. [name], NP Taking Active   HYDROcodone-acetaminophen (NORCO/VICODIN) 5-325 MG tablet [id number]  Take 1 tablet by mouth every 4 (four) hours as needed. Evalee Jefferson, PA-C  Active   isosorbide-hydrALAZINE (BIDIL) 20-37.5 MG tablet [id number] Yes Take 1 tablet by mouth 2 (two) times daily. [name], NP Taking Active   Discontinued 11/15/21 1134 (Change in therapy)   montelukast (SINGULAIR) 10 MG tablet [id number]  Take 1 tablet (10 mg total) by mouth at bedtime. [name], NP  Active   ondansetron (ZOFRAN) 4 MG tablet [id number]  Take 1 tablet (4 mg total) by mouth every 8 (eight) hours as needed for nausea or vomiting. [name], NP  Active    Patient not taking:   Discontinued 11/15/21 1115 (Completed Course)   promethazine-dextromethorphan (PROMETHAZINE-DM) 6.25-15 MG/5ML syrup [id number]  Take 5 mLs by mouth at bedtime as needed for cough. Jaynee Eagles, PA-C  Active               Assessment/Plan:   Diabetes: - Currently controlled but with potential for benefit of GLP1 for help with weight management. Medicaid does not pay for [facility]. Patient has not tried  metformin therapy, though this would not be appropriate given renal dysfunction. Discussed with patient that as long as she does not have GI side effects severe enough to precipitate dehydration, GLP1 can be safe in her degree of kidney dysfunction. Called Dr. Alden Hipp office to leave a message asking for his thoughts on GLP1 use in this patient. His nurse returned the call noting that he was in agreement. Completed PA via cover my meds. Will follow for determination   Hypertension: - Currently uncontrolled - Reviewed long term cardiovascular and renal outcomes of uncontrolled blood pressure - Patient denies history of carvedilol. Recommend to discontinue metoprolol, start carvedilol 12.5 mg twice daily. Discussed with PCP, in agreement. Continue amlodipine and isosorbide/hydralazine - Reviewed appropriate blood pressure monitoring technique and reviewed goal blood pressure. Recommended to check home blood pressure and heart rate daily.   Follow Up Plan: phone call in 2 weeks  Catie Hedwig Morton, PharmD, BCACP [facility] [phone]

## 2021-11-16 LAB — COMPREHENSIVE METABOLIC PANEL
ALT: 9 IU/L (ref 0–32)
AST: 13 IU/L (ref 0–40)
Albumin/Globulin Ratio: 1.6 (ref 1.2–2.2)
Albumin: 4.4 g/dL (ref 3.8–4.9)
Alkaline Phosphatase: 109 IU/L (ref 44–121)
BUN/Creatinine Ratio: 11 (ref 9–23)
BUN: 67 mg/dL — ABNORMAL HIGH (ref 6–24)
Bilirubin Total: 0.3 mg/dL (ref 0.0–1.2)
CO2: 19 mmol/L — ABNORMAL LOW (ref 20–29)
Calcium: 9.1 mg/dL (ref 8.7–10.2)
Chloride: 107 mmol/L — ABNORMAL HIGH (ref 96–106)
Creatinine, Ser: 6.37 mg/dL — ABNORMAL HIGH (ref 0.57–1.00)
Globulin, Total: 2.8 g/dL (ref 1.5–4.5)
Glucose: 95 mg/dL (ref 70–99)
Potassium: 4.3 mmol/L (ref 3.5–5.2)
Sodium: 143 mmol/L (ref 134–144)
Total Protein: 7.2 g/dL (ref 6.0–8.5)
eGFR: 7 mL/min/{1.73_m2} — ABNORMAL LOW (ref >59)

## 2021-11-16 LAB — CBC
Hematocrit: 36.3 % (ref 34.0–46.6)
Hemoglobin: 11.9 g/dL (ref 11.1–15.9)
MCH: 26.3 pg — ABNORMAL LOW (ref 26.6–33.0)
MCHC: 32.8 g/dL (ref 31.5–35.7)
MCV: 80 fL (ref 79–97)
Platelets: 246 10*3/uL (ref 150–450)
RBC: 4.53 x10E6/uL (ref 3.77–5.28)
RDW: 16.4 % — ABNORMAL HIGH (ref 11.7–15.4)
WBC: 8.9 10*3/uL (ref 3.4–10.8)

## 2021-11-16 LAB — LIPID PANEL
Chol/HDL Ratio: 3.8 ratio (ref 0.0–4.4)
Cholesterol, Total: 200 mg/dL — ABNORMAL HIGH (ref 100–199)
HDL: 52 mg/dL (ref >39)
LDL Chol Calc (NIH): 132 mg/dL — ABNORMAL HIGH (ref 0–99)
Triglycerides: 87 mg/dL (ref 0–149)
VLDL Cholesterol Cal: 16 mg/dL (ref 5–40)

## 2021-11-16 LAB — HEMOGLOBIN A1C
Est. average glucose Bld gHb Est-mCnc: 111 mg/dL
Hgb A1c MFr Bld: 5.5 % (ref 4.8–5.6)

## 2021-11-18 ENCOUNTER — Encounter: Payer: Self-pay | Admitting: Neurology

## 2021-11-18 ENCOUNTER — Ambulatory Visit (INDEPENDENT_AMBULATORY_CARE_PROVIDER_SITE_OTHER): Payer: Medicaid Other | Admitting: Neurology

## 2021-11-18 VITALS — BP 147/100 | HR 59 | Ht 62.0 in | Wt 230.0 lb

## 2021-11-18 DIAGNOSIS — G4733 Obstructive sleep apnea (adult) (pediatric): Secondary | ICD-10-CM | POA: Diagnosis not present

## 2021-11-18 DIAGNOSIS — Z789 Other specified health status: Secondary | ICD-10-CM

## 2021-11-18 DIAGNOSIS — Z9989 Dependence on other enabling machines and devices: Secondary | ICD-10-CM | POA: Diagnosis not present

## 2021-11-18 NOTE — Patient Instructions (Signed)
Please continue using your autoPAP regularly. While your insurance requires that you use PAP at least 4 hours each night on 70% of the nights, I recommend, that you not skip any nights and use it throughout the night if you can. Getting used to PAP and staying with the treatment long term does take time and patience and discipline. Untreated obstructive sleep apnea when it is moderate to severe can have an adverse impact on cardiovascular health and raise her risk for heart disease, arrhythmias, hypertension, congestive heart failure, stroke and diabetes. Untreated obstructive sleep apnea causes sleep disruption, nonrestorative sleep, and sleep deprivation. This can have an impact on your day to day functioning and cause daytime sleepiness and impairment of cognitive function, memory loss, mood disturbance, and problems focussing. Using PAP regularly can improve these symptoms.  We can see you in 1 year, you can see one of our nurse practitioners.  Please get in touch with your DME provider, [facility], regarding issues with your fullface mask, you may be able to try something different.  Also have them look at your machine since you report that it is not working as well as it used to.

## 2021-11-18 NOTE — Progress Notes (Signed)
Subjective:    Patient ID: Margaret Aguilar is a 61 y.o. female.  HPI    Interim history:   Ms. Margaret Aguilar is a 61 year old right-handed woman with an underlying medical history of asthma, palpitations, hypertension, hyperlipidemia, gout, reflux disease, diabetes, chronic kidney disease, depression, anxiety, arthritis, anemia, back pain, bradycardia, and morbid obesity with a BMI of over 40, who presents for follow-up consultation of her obstructive sleep apnea after interim testing and starting AutoPap therapy.  The patient is unaccompanied today.  I first met her at the request of her primary care nurse practitioner on 05/17/2020, at which time she reported a prior diagnosis of obstructive sleep apnea.  She had not been on PAP treatment before.  She was advised to proceed with sleep study.  She had a home sleep test on 06/03/2021 which showed severe obstructive sleep apnea with an AHI of 53.8/h, O2 nadir 85%, variable snoring detected.  She was advised to proceed with AutoPap therapy.  Her set up date was 06/18/2021.  She has a ResMed AirSense 11 AutoSet machine.  She missed an interim appointment on 09/10/2021.  Today, 11/18/2021: I reviewed her AutoPap compliance data from 08/13/2021 through 09/11/2021, during which time she used her machine 29 out of 30 days with percent use days greater than 4 hours at 87%, indicating very good compliance with an average usage of 6 hours and 17 minutes, residual AHI at goal at 2.4/h, average pressure for the 95th percentile at 12.7 cm with a range of 7 to 13 cm, leak acceptable with the 95th percentile at 9.4 mL/min.  Lately, in past 30 days her compliance has declined, there is no usage since mid June 2023.  She reports that she dropped her AutoPap machine twice and then since then it has not worked as well, the pressure does not seem right.  She has not contacted her DME company yet but will do so after today.  She has not used her machine since then.  She does report  improvement in her sleep consolidation and daytime energy when she was consistently using it.  She does not necessarily like the mask that she has but is willing to talk to her DME provider about it, she uses a medium [place] 3B fullface mask.  But the looks of it, her AutoPap machine some scratches on it, again, she is advised to get it looked at by her DME provider.  She reports that she may need a kidney transplant eventually.  She is followed by nephrology.  She drinks very occasional coffee and tea, maybe once a month.  She is working on weight loss.  She is motivated to continue with her AutoPap.  The patient's allergies, current medications, family history, past medical history, past social history, past surgical history and problem list were reviewed and updated as appropriate.   Previously (copied from previous notes for reference):   05/17/20: (She) was previously diagnosed with obstructive sleep apnea in 2015. She had a sleep study which was interpreted by Dr. Phillips Odor. I was able to review results. AHI was 23/h, O2 nadir 74%. She has not been on CPAP therapy. I reviewed your office note from 12/22/2019. Her Epworth sleepiness score is 16 out of 24, fatigue severity score is 16 out of 63.  She was not able to pursue sleep apnea treatment at the time due to cost as she recalls.  She has occasional morning headaches.  She has nocturia about 4-6 times on an average night and  is followed by nephrology through [facility].  Bedtime is generally between 10 and 11 and rise time around 6 AM.  She lives with her husband.  She used to work as a Psychologist, sport and exercise.  She is raising her 3 great nephews and nieces, ages 71-year-old boy and 63-year-old twins, boy and girl.  She has raised them for the past 3 years.  She is a non-smoker.  She drinks no caffeine daily and drinks alcohol rarely.  She has been working on weight loss and in the past year she lost about 50 pounds but has regained some.  She would be  willing to get rechecked for sleep apnea and consider treatment.  She has been noted to have apneas by her husband.  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) (HCC)    Palpitations    Pneumonia    several   Sleep apnea    hasn't gotten cpap yet   SOB (shortness of breath)    Vitamin D-dependent rickets     Her Past Surgical History Is Significant For: Past Surgical History:  Procedure Laterality Date   AV FISTULA PLACEMENT Left 10/19/2018   Procedure: LEFT ARTERIOVENOUS (AV) FISTULA CREATION;  Surgeon: Waynetta Sandy, MD;  Location: [place];  Service: Vascular;  Laterality: Left;   [place] Left 06/30/2019   Procedure: Bascilic Vein Transposition;  Surgeon: Waynetta Sandy, MD;  Location: [place];  Service: Vascular;  Laterality: Left;   CESAREAN SECTION     x 1   COLONOSCOPY N/A 01/12/2014   Procedure: COLONOSCOPY;  Surgeon: Rogene Houston, MD;  [facility];  Service: Endoscopy;  Laterality: N/A;  26   D&C     TUBAL LIGATION      Her Family History Is Significant For: Family History  Problem Relation Age of Onset   Diabetes Mother    Hyperlipidemia Mother    Depression Mother    Anxiety disorder Mother    Sleep apnea Mother    Eating disorder Mother    Obesity Mother    Hyperlipidemia Father    Hypertension Father    Heart disease Father    Sleep apnea Father    Alcoholism Father    Drug abuse Father    Sleep apnea Sister    Stroke Brother    Diabetes Brother     Her  Social History Is Significant For: Social History   Socioeconomic History   Marital status: Married    Spouse name: Nadara Mustard   Number of children: Not on file   Years of education: Not on file   Highest education level: Not on file  Occupational History   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  Tobacco Use   Smoking status: Never   Smokeless tobacco: Never  Vaping Use   Vaping Use: Never  used  Substance and Sexual Activity   Alcohol use: No   Drug use: Yes    Types: Marijuana   Sexual activity: Not Currently    Birth control/protection: Post-menopausal  Other Topics Concern   Not on file  Social History Narrative   Not on file   Social Determinants of Health   Financial Resource Strain: Medium Risk (02/28/2020)   Overall Financial Resource Strain (CARDIA)    Difficulty of Paying Living Expenses: Somewhat hard  Food Insecurity: No Food Insecurity (12/12/2019)   Hunger Vital Sign    Worried About Running Out of Food in the Last Year: Never true    Ran Out of Food in the Last Year: Never true  Transportation Needs: No Transportation Needs (12/15/2019)   PRAPARE - Hydrologist (Medical): No    Lack of Transportation (Non-Medical): No  Physical Activity: Sufficiently Active (01/12/2020)   Exercise Vital Sign    Days of Exercise per Week: 4 days    Minutes of Exercise per Session: 60 min  Stress: No Stress Concern Present (02/28/2020)   [place]    Feeling of Stress : Not at all  Recent Concern: Stress - Stress Concern Present (01/12/2020)   [place]    Feeling of Stress : Rather much  Social Connections: Moderately Integrated (01/12/2020)   Social Connection and Isolation Panel [NHANES]    Frequency of Communication with Friends and Family: Three times a week    Frequency of Social Gatherings with Friends and  Family: Three times a week    Attends Religious Services: More than 4 times per year    Active Member of Clubs or Organizations: No    Attends Archivist Meetings: Never    Marital Status: Married    Her Allergies Are:  Allergies  Allergen Reactions   Nutritional Supplements     [place] >> UNSPECIFIED REACTION  Severity from [place]   Latex Swelling and Rash    SWELLING REACTION UNSPECIFIED   :   Her Current Medications Are:  Outpatient Encounter Medications as of 11/18/2021  Medication Sig   albuterol (PROVENTIL) (2.5 MG/3ML) 0.083% nebulizer solution Take 3 mLs (2.5 mg total) by nebulization every 6 (six) hours as needed for wheezing or shortness of breath.   albuterol (VENTOLIN HFA) 108 (90 Base) MCG/ACT inhaler Inhale 2 puffs into the lungs every 6 (six) hours as needed for wheezing or shortness of breath.   allopurinol (ZYLOPRIM) 100 MG tablet Take 100 mg by mouth daily.   amLODipine (NORVASC) 10 MG tablet Take 1 tablet (10 mg total) by mouth daily.   benzonatate (TESSALON) 100 MG capsule Take 1-2 capsules (100-200 mg total) by mouth 3 (three) times daily as needed for cough.   calcitRIOL (ROCALTROL) 0.25 MCG capsule Take 0.25 mcg by mouth daily.   carvedilol (COREG) 12.5 MG tablet Take 1 tablet (12.5 mg total) by mouth 2 (two) times daily with a meal.   colchicine 0.6 MG tablet Take 1 tablet (0.6 mg total) by mouth 2 (two) times daily as needed (gout flare).   docusate sodium (COLACE) 100 MG capsule Take 1 capsule (100 mg total) by mouth 2 (two) times daily.   furosemide (LASIX) 40 MG tablet Take 1 tablet (40 mg total) by mouth daily.   HYDROcodone-acetaminophen (NORCO/VICODIN) 5-325 MG tablet Take 1 tablet by mouth every 4 (four) hours as needed.   isosorbide-hydrALAZINE (  BIDIL) 20-37.5 MG tablet Take 1 tablet by mouth 2 (two) times daily.   metoprolol succinate (TOPROL-XL) 100 MG 24 hr tablet Take 100 mg by mouth daily. Take with or immediately following a meal.  Take 0.5  tablets daily   montelukast (SINGULAIR) 10 MG tablet Take 1 tablet (10 mg total) by mouth at bedtime.   ondansetron (ZOFRAN) 4 MG tablet Take 1 tablet (4 mg total) by mouth every 8 (eight) hours as needed for nausea or vomiting.   promethazine-dextromethorphan (PROMETHAZINE-DM) 6.25-15 MG/5ML syrup Take 5 mLs by mouth at bedtime as needed for cough.   Semaglutide,0.25 or 0.5MG/DOS, (OZEMPIC, 0.25 OR 0.5 MG/DOSE,) 2 MG/3ML SOPN Inject 0.25 mg weekly for 4 weeks, then increase to 0.5 mg weekly   No facility-administered encounter medications on file as of 11/18/2021.  :  Review of Systems:  Out of a complete 14 point review of systems, all are reviewed and negative with the exception of these symptoms as listed below: Review of Systems  Neurological:        Pt here for CPAP consult   Pt states knocked over CPAP twice a month ago Pt states has not used since Informed patient she can call her DME if any issues withy CPAP    ESS:13    Objective:  Neurological Exam  Physical Exam Physical Examination:   Vitals:   11/18/21 0924  BP: (!) 147/100  Pulse: (!) 59   She denies any symptoms from elevated blood pressure.  She has not taken her blood pressure medication yet this morning.  General Examination: The patient is a very pleasant 61 y.o. female in no acute distress. She appears well-developed and well-nourished and well groomed.   HEENT: Normocephalic, atraumatic, pupils are equal, round and reactive to light, extraocular tracking is good without limitation to gaze excursion or nystagmus noted. Hearing is grossly intact. Face is symmetric with normal facial animation. Speech is clear with no dysarthria noted. There is no hypophonia. There is no lip, neck/head, jaw or voice tremor. Neck is supple with full range of passive and active motion. There are no carotid bruits on auscultation. Oropharynx exam reveals: mild mouth dryness, adequate dental hygiene with several missing teeth.  She has  partials for both upper and lower jaws but has not been able to wear them, due to discomfort.  She has moderate airway crowding.  Tongue protrudes centrally in palate elevates symmetrically.     Chest: Clear to auscultation without wheezing, rhonchi or crackles noted.   Heart: S1+S2+0, regular and normal without murmurs, rubs or gallops noted.    Abdomen: Soft, non-tender and non-distended with normal bowel sounds appreciated on auscultation.   Extremities: There is trace pitting edema in the distal lower extremities bilaterally, left more than right.    Skin: Warm and dry without trophic changes noted.    Musculoskeletal: exam reveals no joint deformities.      Neurologically:  Mental status: The patient is awake, alert and oriented in all 4 spheres. Her immediate and remote memory, attention, language skills and fund of knowledge are appropriate. There is no evidence of aphasia, agnosia, apraxia or anomia. Speech is clear with normal prosody and enunciation. Thought process is linear. Mood is normal and affect is normal.  Cranial nerves II - XII are as described above under HEENT exam.  Motor exam: Normal bulk, strength and tone is noted. There is no obvious tremor.  Fine motor skills and coordination: grossly intact.  Cerebellar testing: No dysmetria or  intention tremor. There is no truncal or gait ataxia.  Sensory exam: intact to light touch in the upper and lower extremities.  Gait, station and balance: She stands easily. No veering to one side is noted. No leaning to one side is noted. Posture is age-appropriate and stance is narrow based. Gait shows normal stride length and normal pace. No problems turning are noted.    Assessment and Plan:    In summary, Celest Reitz is a very pleasant 61 year old female with an underlying medical history of asthma, palpitations, hypertension, hyperlipidemia, gout, reflux disease, diabetes, chronic kidney disease, depression, anxiety,  arthritis, anemia, back pain, bradycardia, and morbid obesity with a BMI of over 90, who presents for follow-up consultation for obstructive sleep apnea which was found to be in the severe range by home sleep testing on 06/03/2021.  Her AHI was 53.8/h, O2 nadir 85% with variable snoring detected.  She established treatment with AutoPap therapy on 06/18/2021.  She has been consistent with her usage and apnea scores are good, she has not used her machine since the machine dropped a couple of times and she feels that it is not blowing the air quite the same as before.  She is willing to get it looked at by her DME provider.  She also would like to try a different mask and she can talk to them about it at the same time as her troubleshoot appointment.  She is advised of sleep test results and we reviewed her compliance data in detail.  She is endorsing benefit from treatment and that her sleep quality and sleep consolidation improved.  She is working on weight loss.  She is advised at this point to follow-up routinely in 1 year to see one of our nurse practitioners and encouraged to get back on her AutoPap consistently.  She is commended for her treatment adherence and motivated to continue with it. I answered all her questions today and she was in agreement.   I spent 30 minutes in total face-to-face time and in reviewing records during pre-charting, more than 50% of which was spent in counseling and coordination of care, reviewing test results, reviewing medications and treatment regimen and/or in discussing or reviewing the diagnosis of OSA, the prognosis and treatment options. Pertinent laboratory and imaging test results that were available during this visit with the patient were reviewed by me and considered in my medical decision making (see chart for details).

## 2021-11-19 ENCOUNTER — Emergency Department (HOSPITAL_COMMUNITY)
Admission: EM | Admit: 2021-11-19 | Discharge: 2021-11-20 | Discharge status: Against Medical Advice | Payer: Medicaid Other | Attending: Emergency Medicine | Admitting: Emergency Medicine

## 2021-11-19 ENCOUNTER — Emergency Department (HOSPITAL_COMMUNITY): Payer: Medicaid Other

## 2021-11-19 DIAGNOSIS — R0789 Other chest pain: Secondary | ICD-10-CM | POA: Diagnosis not present

## 2021-11-19 DIAGNOSIS — R079 Chest pain, unspecified: Secondary | ICD-10-CM | POA: Diagnosis not present

## 2021-11-19 DIAGNOSIS — Z5321 Procedure and treatment not carried out due to patient leaving prior to being seen by health care provider: Secondary | ICD-10-CM | POA: Insufficient documentation

## 2021-11-19 DIAGNOSIS — I10 Essential (primary) hypertension: Secondary | ICD-10-CM | POA: Diagnosis not present

## 2021-11-19 DIAGNOSIS — R112 Nausea with vomiting, unspecified: Secondary | ICD-10-CM | POA: Insufficient documentation

## 2021-11-19 DIAGNOSIS — R001 Bradycardia, unspecified: Secondary | ICD-10-CM | POA: Diagnosis not present

## 2021-11-19 DIAGNOSIS — R110 Nausea: Secondary | ICD-10-CM | POA: Diagnosis not present

## 2021-11-19 DIAGNOSIS — R55 Syncope and collapse: Secondary | ICD-10-CM | POA: Diagnosis not present

## 2021-11-19 DIAGNOSIS — I959 Hypotension, unspecified: Secondary | ICD-10-CM | POA: Diagnosis not present

## 2021-11-19 DIAGNOSIS — R42 Dizziness and giddiness: Secondary | ICD-10-CM | POA: Diagnosis not present

## 2021-11-19 DIAGNOSIS — R0602 Shortness of breath: Secondary | ICD-10-CM | POA: Diagnosis not present

## 2021-11-19 LAB — CBC WITH DIFFERENTIAL/PLATELET
Abs Immature Granulocytes: 0.03 10*3/uL (ref 0.00–0.07)
Basophils Absolute: 0 10*3/uL (ref 0.0–0.1)
Basophils Relative: 0 %
Eosinophils Absolute: 0.2 10*3/uL (ref 0.0–0.5)
Eosinophils Relative: 2 %
HCT: 35.3 % — ABNORMAL LOW (ref 36.0–46.0)
Hemoglobin: 11 g/dL — ABNORMAL LOW (ref 12.0–15.0)
Immature Granulocytes: 0 %
Lymphocytes Relative: 17 %
Lymphs Abs: 1.5 10*3/uL (ref 0.7–4.0)
MCH: 26.1 pg (ref 26.0–34.0)
MCHC: 31.2 g/dL (ref 30.0–36.0)
MCV: 83.8 fL (ref 80.0–100.0)
Monocytes Absolute: 0.5 10*3/uL (ref 0.1–1.0)
Monocytes Relative: 6 %
Neutro Abs: 6.6 10*3/uL (ref 1.7–7.7)
Neutrophils Relative %: 75 %
Platelets: 245 10*3/uL (ref 150–400)
RBC: 4.21 MIL/uL (ref 3.87–5.11)
RDW: 16 % — ABNORMAL HIGH (ref 11.5–15.5)
WBC: 8.8 10*3/uL (ref 4.0–10.5)
nRBC: 0 % (ref 0.0–0.2)

## 2021-11-19 LAB — COMPREHENSIVE METABOLIC PANEL
ALT: 9 U/L (ref 0–44)
AST: 14 U/L — ABNORMAL LOW (ref 15–41)
Albumin: 3.8 g/dL (ref 3.5–5.0)
Alkaline Phosphatase: 88 U/L (ref 38–126)
Anion gap: 11 (ref 5–15)
BUN: 81 mg/dL — ABNORMAL HIGH (ref 6–20)
CO2: 19 mmol/L — ABNORMAL LOW (ref 22–32)
Calcium: 8.6 mg/dL — ABNORMAL LOW (ref 8.9–10.3)
Chloride: 110 mmol/L (ref 98–111)
Creatinine, Ser: 7.02 mg/dL — ABNORMAL HIGH (ref 0.44–1.00)
GFR, Estimated: 6 mL/min — ABNORMAL LOW (ref >60)
Glucose, Bld: 115 mg/dL — ABNORMAL HIGH (ref 70–99)
Potassium: 4 mmol/L (ref 3.5–5.1)
Sodium: 140 mmol/L (ref 135–145)
Total Bilirubin: 0.6 mg/dL (ref 0.3–1.2)
Total Protein: 6.7 g/dL (ref 6.5–8.1)

## 2021-11-19 LAB — URINALYSIS, ROUTINE W REFLEX MICROSCOPIC
Bilirubin Urine: NEGATIVE
Glucose, UA: NEGATIVE mg/dL
Hgb urine dipstick: NEGATIVE
Ketones, ur: NEGATIVE mg/dL
Nitrite: NEGATIVE
Protein, ur: >=300 mg/dL — AB
Specific Gravity, Urine: 1.013 (ref 1.005–1.030)
pH: 5 (ref 5.0–8.0)

## 2021-11-19 LAB — LIPASE, BLOOD: Lipase: 53 U/L — ABNORMAL HIGH (ref 11–51)

## 2021-11-19 LAB — TROPONIN I (HIGH SENSITIVITY): Troponin I (High Sensitivity): 18 ng/L — ABNORMAL HIGH (ref <18)

## 2021-11-19 NOTE — ED Triage Notes (Signed)
Pt via [facility] for eval of nausea/ emesis, lightheadedness without associated abd pain.   Pt reports she started 2 medications today (ozympic shot, carvedilol)  117/64 HR 60, EKG w PVC's  20g R FA

## 2021-11-19 NOTE — ED Provider Triage Note (Signed)
Emergency Medicine Provider Triage Evaluation Note  Margaret Aguilar , a 61 y.o. female  was evaluated in triage.  Pt complains of nausea and vomiting and near syncope that occurred just prior to arrival.  Patient is a diabetic and she received her first Ozempic shot at about 1:30 PM earlier today.  She also started carvedilol as well.  She states that she had cold sweats and frequently felt as if she was going to pass out.  She has no abdominal pain.  She states that she is having some chest pain on the left lateral side that is coming and going.  Denies headache, slurred speech, weakness, numbness, tingling.  Review of Systems  Positive:  Negative:   Physical Exam  BP (!) 135/91   Pulse 63   Temp (!) 97.4 F (36.3 C) (Oral)   Resp 18   LMP  (LMP Unknown)   SpO2 99%  Gen:   Awake, no distress   Resp:  Normal effort  MSK:   Moves extremities without difficulty  Other:    Medical Decision Making  Medically screening exam initiated at 7:57 PM.  Appropriate orders placed.  Margaret Aguilar was informed that the remainder of the evaluation will be completed by another provider, this initial triage assessment does not replace that evaluation, and the importance of remaining in the ED until their evaluation is complete.     Tonye Pearson, [place] 11/19/21 1958

## 2021-11-20 ENCOUNTER — Encounter (INDEPENDENT_AMBULATORY_CARE_PROVIDER_SITE_OTHER): Payer: Self-pay

## 2021-11-20 NOTE — ED Notes (Signed)
Want want IV taken out so she can leave. IV out MSE signed

## 2021-11-25 ENCOUNTER — Institutional Professional Consult (permissible substitution): Payer: Medicaid Other | Admitting: Clinical

## 2021-11-26 ENCOUNTER — Other Ambulatory Visit: Payer: Self-pay | Admitting: Pharmacist

## 2021-11-26 DIAGNOSIS — N184 Chronic kidney disease, stage 4 (severe): Secondary | ICD-10-CM

## 2021-11-26 DIAGNOSIS — I10 Essential (primary) hypertension: Secondary | ICD-10-CM

## 2021-11-26 NOTE — Patient Instructions (Signed)
Renarda,   It was great talking to you today!  There can be more nausea and stomach upset with Ozempic with big meals or high fat meals. Focus on decreasing fatty foods (red meats, high fat meals, fried foods) and focus on smaller, more frequent meals/snacks that incorporate some protein, fruits and vegetables, and/or whole grains.   Focus on hydration. Please call us if you have severe stomach upset after this next dose and you are unable to keep fluids down.   Take care!  Catie Hedwig Morton, PharmD, [facility] [phone]

## 2021-11-26 NOTE — Chronic Care Management (AMB) (Signed)
Chief Complaint  Patient presents with   Diabetes   Hypertension    Margaret Aguilar is a 61 y.o. year old female who presented for a telephone visit.   They were referred to the pharmacist by their PCP for assistance in managing diabetes and hypertension.   Subjective:  Care Team: Primary Care Provider: [name], NP ; Next Scheduled Visit: 02/17/22  Medication Access/Adherence  [facility] Pharmacy[facility]  [id number] [address]Phone: [phone]: [address]Phone: [phone]: [phone]   Patient reports affordability concerns with their medications: No  Patient reports access/transportation concerns to their pharmacy: No  Patient reports adherence concerns with their medications:  No     Diabetes:  Current medications: Ozempic 0.25 mg weekly x 1  Reports she did not each much last Tuesday when she started Ozempic, resulted in significant nausea and hypotension. Reports she has been feeling better since that day  Current meal patterns:  - Breakfast: typically skips; sometimes husband will make eggs, grits; but she doesn't eat a lot of this - Lunch: 2 pieces of [employer] pizza (chicken rotisserie);  - Supper: chicken tenders, corn on the cob, pinto beans; tacos; beef with chicken;  - Snacks: fruit, pears, watermelon - Drinks: light tea, island punch juice, water; clear sodas   Hypertension:  Current medications: amlodipine 10 mg daily, isosorbide/hydralazine 20/37.5 mg twice daily, carvedilol 12.5 mg twice daily  Patient has a validated, automated, upper arm home BP cuff Current blood pressure readings readings: 140s/90s, HF 60s  Patient denies hypotensive s/sx including dizziness, lightheadedness since last week   Health Maintenance  Health Maintenance Due   Topic Date Due   FOOT EXAM  Never done   PAP SMEAR-Modifier  Never done   Zoster Vaccines- Shingrix (1 of 2) Never done   COVID-19 Vaccine (2 - Moderna series) 02/06/2020   OPHTHALMOLOGY EXAM  07/25/2020   INFLUENZA VACCINE  11/12/2021     Objective: Lab Results  Component Value Date   HGBA1C 5.5 11/15/2021    Lab Results  Component Value Date   CREATININE 7.02 (H) 11/19/2021   BUN 81 (H) 11/19/2021   NA 140 11/19/2021   K 4.0 11/19/2021   CL 110 11/19/2021   CO2 19 (L) 11/19/2021    Lab Results  Component Value Date   CHOL 200 (H) 11/15/2021   HDL 52 11/15/2021   LDLCALC 132 (H) 11/15/2021   TRIG 87 11/15/2021   CHOLHDL 3.8 11/15/2021    Medications Reviewed Today     Reviewed by HOsker Mason RPH-CPP (Pharmacist) on 11/26/21 at 1600  Med List Status: <None>   Medication Order Taking? Sig Documenting Provider Last Dose Status Informant  albuterol (PROVENTIL) (2.5 MG/3ML) 0.083% nebulizer solution [id number] Take 3 mLs (2.5 mg total) by nebulization every 6 (six) hours as needed for wheezing or shortness of breath. KVevelyn Francois NP Taking Active Self  albuterol (VENTOLIN HFA) 108 (90 Base) MCG/ACT inhaler [id number] Inhale 2 puffs into the lungs every 6 (six) hours as needed for wheezing or shortness of breath. KVevelyn Francois NP Taking Active Self  allopurinol (ZYLOPRIM) 100 MG tablet [id number] Take 100 mg by mouth daily. [provider] Taking Active Self  amLODipine (NORVASC) 10 MG tablet [id number] Take 1  tablet (10 mg total) by mouth daily. [name], NP Taking Active   benzonatate (TESSALON) 100 MG capsule [id number] No Take 1-2 capsules (100-200 mg total) by mouth 3 (three) times daily as needed for cough.  Patient not taking: Reported on 11/26/2021   Jaynee Eagles, PA-C Not Taking Active   calcitRIOL (ROCALTROL) 0.25 MCG capsule [id number] Yes Take 0.25 mcg by mouth daily. [provider] Taking Active Self   carvedilol (COREG) 12.5 MG tablet [id number] Yes Take 1 tablet (12.5 mg total) by mouth 2 (two) times daily with a meal. [name], NP Taking Active   colchicine 0.6 MG tablet [id number] Yes Take 1 tablet (0.6 mg total) by mouth 2 (two) times daily as needed (gout flare). Evalee Jefferson, PA-C Taking Active   docusate sodium (COLACE) 100 MG capsule [id number] Yes Take 1 capsule (100 mg total) by mouth 2 (two) times daily. [name], NP Taking Active   furosemide (LASIX) 40 MG tablet [id number] Yes Take 1 tablet (40 mg total) by mouth daily. [name], NP Taking Active   HYDROcodone-acetaminophen (NORCO/VICODIN) 5-325 MG tablet [id number] Yes Take 1 tablet by mouth every 4 (four) hours as needed. Evalee Jefferson, PA-C Taking Active   isosorbide-hydrALAZINE (BIDIL) 20-37.5 MG tablet [id number] Yes Take 1 tablet by mouth 2 (two) times daily. [name], NP Taking Active   Patient not taking:  Discontinued 11/26/21 1600 (Change in therapy)   montelukast (SINGULAIR) 10 MG tablet [id number] Yes Take 1 tablet (10 mg total) by mouth at bedtime. [name], NP Taking Active   ondansetron (ZOFRAN) 4 MG tablet [id number] No Take 1 tablet (4 mg total) by mouth every 8 (eight) hours as needed for nausea or vomiting.  Patient not taking: Reported on 11/26/2021   [name], NP Not Taking Active   promethazine-dextromethorphan (PROMETHAZINE-DM) 6.25-15 MG/5ML syrup [id number] Yes Take 5 mLs by mouth at bedtime as needed for cough. Jaynee Eagles, PA-C Taking Active   Semaglutide,0.25 or 0.'5MG'$ /DOS, (OZEMPIC, 0.25 OR 0.5 MG/DOSE,) 2 MG/3ML SOPN [id number] Yes Inject 0.25 mg weekly for 4 weeks, then increase to 0.5 mg weekly [name], NP Taking Active               Assessment/Plan:   Diabetes: - Currently controlled but with opportunity for weight loss - Extensive dietary counseling. Recommend focus on lean proteins, low fats, small more frequent meals, minimizing high  carbohydrate/sugary beverages.  - Encouraged to contact us if development of severe GI upset symptoms again. Reminded of the importance of hydration.   Hypertension: - Currently uncontrolled but improved - Reviewed appropriate blood pressure monitoring technique and reviewed goal blood pressure. Recommended to check home blood pressure and heart rate daily - Recommend to continue current regimen at this time     Follow Up Plan: follow up call in 3 days  Catie TJodi Mourning, PharmD, [place] [phone]

## 2021-11-29 ENCOUNTER — Other Ambulatory Visit: Payer: Self-pay | Admitting: Pharmacist

## 2021-11-29 NOTE — Chronic Care Management (AMB) (Signed)
Error - duplicate

## 2021-11-29 NOTE — Chronic Care Management (AMB) (Signed)
Care Coordination  Called patient. She reports she tolerated her second Ozempic injection, denies significant nausea. Reports some constipation, but is drinking a green juice to help.   Reports BP yesterday was 138/80 with a HR of 64.   Scheduled follow up call in 2 weeks.  Catie Hedwig Morton, PharmD, [facility] [phone]

## 2021-11-29 NOTE — Chronic Care Management (AMB) (Signed)
Documenting home BP reading in PCP clinic context for TNM documentation

## 2021-12-02 ENCOUNTER — Ambulatory Visit (INDEPENDENT_AMBULATORY_CARE_PROVIDER_SITE_OTHER): Payer: Medicaid Other | Admitting: Clinical

## 2021-12-02 DIAGNOSIS — F332 Major depressive disorder, recurrent severe without psychotic features: Secondary | ICD-10-CM

## 2021-12-02 NOTE — BH Specialist Note (Addendum)
Integrated Behavioral Health Initial In-Person Visit  MRN: [id number] Name: Margaret Aguilar [facility]  Number of [place] Clinician visits: 1- Initial Visit  Session Start time: 1517    Session End time: 6160  Total time in minutes: 60   Types of Service: Individual psychotherapy  Interpretor:No. Interpretor Name and Language: none  Subjective: Margaret Aguilar is a 61 y.o. female accompanied by  self. Patient was referred by Lazaro Arms, NP for depression related to health conditions. Patient reports the following symptoms/concerns: depression, anxiety, history of trauma Duration of problem: several years; Severity of problem: severe  Objective: Mood: Euthymic and Affect: Appropriate Risk of harm to self or others: No plan to harm self or others  Life Context: Family and Social: lives with husband and grand-niece and nephews; adult son also lives in apartment attached to their house; two other adult sons in the area School/Work: stopped working about 5 years ago due to health condition; raises her nephew's children Self-Care:  Life Changes: development of kidney disease  Patient and/or Family's Strengths/Protective Factors: Social connections, Social and Patent attorney, Concrete supports in place (healthy food, safe environments, etc.), and Sense of purpose  Goals Addressed: Patient will: Reduce symptoms of: anxiety and depression Increase knowledge and/or ability of: coping skills and self-management skills  Demonstrate ability to: Increase healthy adjustment to current life circumstances  Progress towards Goals: Ongoing  Interventions: Interventions utilized: Supportive Counseling  Standardized Assessments completed: Not Needed  Assessment and supportive counseling today. Patient experiencing depression related to health conditions, relationship dynamics, and history of trauma. Emotional validation and reflective listening provided.    Patient and/or Family Response: Patient engaged in session.   Patient Centered Plan: Patient is on the following Treatment Plan(s): CBT for depression  Assessment: Patient currently experiencing depression related to health conditions, relationship dynamics, and history of trauma.   Patient may benefit from CBT to explore thoughts and emotions and development of coping skills for living with chronic illnesses.   Plan: Follow up with behavioral health clinician on: 12/17/21 Referral(s): [place] (In Clinic)  Estanislado Emms, [facility] [phone]

## 2021-12-04 DIAGNOSIS — G4733 Obstructive sleep apnea (adult) (pediatric): Secondary | ICD-10-CM | POA: Diagnosis not present

## 2021-12-11 ENCOUNTER — Ambulatory Visit: Payer: Medicaid Other

## 2021-12-11 ENCOUNTER — Other Ambulatory Visit: Payer: Medicaid Other | Admitting: Pharmacist

## 2021-12-11 DIAGNOSIS — M10379 Gout due to renal impairment, unspecified ankle and foot: Secondary | ICD-10-CM

## 2021-12-11 DIAGNOSIS — I10 Essential (primary) hypertension: Secondary | ICD-10-CM

## 2021-12-11 DIAGNOSIS — N184 Chronic kidney disease, stage 4 (severe): Secondary | ICD-10-CM

## 2021-12-11 DIAGNOSIS — E1122 Type 2 diabetes mellitus with diabetic chronic kidney disease: Secondary | ICD-10-CM

## 2021-12-11 MED ORDER — COLCHICINE 0.6 MG PO TABS: ORAL_TABLET | ORAL | 1 refills | Status: AC

## 2021-12-11 MED ORDER — ALLOPURINOL 100 MG PO TABS: 50.0000 mg | ORAL_TABLET | ORAL | 2 refills | Status: DC

## 2021-12-11 NOTE — Patient Instructions (Signed)
Margaret Aguilar,   It was great talking to you today.   Tonya and I discussed adjusting your gout medications to the following:  - allopurinol 50 mg (1/2 of a 100 mg tablet) twice weekly - separate this across the week, so for example, take a dose on Monday and a dose Thursday, or whatever days work best for your routine.  - colchicine 0.3 mg (1/2 of a 0.6 mg tablet) today, then every 3 days until your flare resolved. Please let us know if you develop any side effects from too much colchicine - these are typically stomach upset or diarrhea.   If you aren't having stomach side effects from colchicine, I support you increasing the Ozempic dose from 0.25 mg to 0.5 mg next week as prescribed.   Try setting an alarm on your phone twice a day to help you stay on top of taking your medications, particularly those blood pressure medications, regularly.   Please call with any questions or concerns. Thanks!  Catie Hedwig Morton, PharmD, [facility] [phone]

## 2021-12-11 NOTE — Progress Notes (Signed)
Chief Complaint  Patient presents with   Diabetes   Hypertension    Margaret Aguilar is a 61 y.o. year old female who presented for a telephone visit.   They were referred to the pharmacist by their PCP for assistance in managing diabetes and hypertension.   Patient is participating in a Managed Medicaid Plan:  Yes  Subjective:  Care Team: Primary Care Provider: [name], NP ; Next Scheduled Visit: 02/17/22 GI: Berniece Pap; Next Scheduled Visit 12/23/21 Nephrology; Next Scheduled Visit: 01/2022 per patient memory  Medication Access/Adherence  [facility]  [id number]: [phone]: [address]Phone: [phone]: [phone]   Patient reports affordability concerns with their medications: No  Patient reports access/transportation concerns to their pharmacy: No  Patient reports adherence concerns with their medications:  Yes  reports she is often forgetting to take her morning blood pressure medications with how hectic it is to get the kids out the door  Gout Flare: Patient reports swelling, erythema in her left pinky toe. Had old scripts for colchicine, allopurinol that she has been taking.   Diabetes:  Current medications: Ozempic 0.25 mg weekly  Reports improvement in tolerability with focusing on smaller meals/snacks every 3 hours   Has not weighed, but does report that she has a pair of pants she used to not be able to fit into, and now can.   Hypertension:  Current medications: carvedilol 12.5 mg twice daily, isosorbide/hydralazine 20/37.5 mg twice daily, amlodipine 10 mg daily  Patient has a validated, automated, upper arm home BP cuff Current blood pressure readings readings: SBP 150s per her memory   Health  Maintenance  Health Maintenance Due  Topic Date Due   FOOT EXAM  Never done   PAP SMEAR-Modifier  Never done   Zoster Vaccines- Shingrix (1 of 2) Never done   COVID-19 Vaccine (2 - Moderna series) 02/06/2020   OPHTHALMOLOGY EXAM  07/25/2020   INFLUENZA VACCINE  11/12/2021     Objective: Lab Results  Component Value Date   HGBA1C 5.5 11/15/2021    Lab Results  Component Value Date   CREATININE 7.02 (H) 11/19/2021   BUN 81 (H) 11/19/2021   NA 140 11/19/2021   K 4.0 11/19/2021   CL 110 11/19/2021   CO2 19 (L) 11/19/2021    Lab Results  Component Value Date   CHOL 200 (H) 11/15/2021   HDL 52 11/15/2021   LDLCALC 132 (H) 11/15/2021   TRIG 87 11/15/2021   CHOLHDL 3.8 11/15/2021    Medications Reviewed Today     Reviewed by HOsker Mason RPH-CPP (Pharmacist) on 12/11/21 at 128 Med List Status: <None>   Medication Order Taking? Sig Documenting Provider Last Dose Status Informant  albuterol (PROVENTIL) (2.5 MG/3ML) 0.083% nebulizer solution [id number] Take 3 mLs (2.5 mg total) by nebulization every 6 (six) hours as needed for wheezing or shortness of breath. KVevelyn Francois NP  Active Self  albuterol (VENTOLIN HFA) 108 (90 Base) MCG/ACT inhaler [id number] Inhale 2 puffs into the lungs every 6 (six) hours as needed for wheezing or shortness of breath. KVevelyn Francois NP  Active Self  allopurinol (ZYLOPRIM) 100 MG tablet [id number] Take 100 mg by mouth daily. [provider] Taking Active Self  amLODipine (NORVASC) 10 MG tablet [id number] Yes Take 1 tablet (10 mg total) by mouth daily. [name], NP Taking Active   benzonatate (TESSALON) 100 MG capsule [id number]  Take 1-2 capsules (100-200 mg total) by mouth 3 (three) times daily as needed for cough.  Patient not taking: Reported on 11/26/2021   Jaynee Eagles, PA-C  Active   calcitRIOL (ROCALTROL) 0.25 MCG capsule [id number] Yes Take 0.25 mcg by mouth daily. [provider] Taking Active Self   carvedilol (COREG) 12.5 MG tablet [id number] Yes Take 1 tablet (12.5 mg total) by mouth 2 (two) times daily with a meal. [name], NP Taking Active   colchicine 0.6 MG tablet [id number] Yes Take 1 tablet (0.6 mg total) by mouth 2 (two) times daily as needed (gout flare). Evalee Jefferson, PA-C Taking Active   docusate sodium (COLACE) 100 MG capsule [id number] Yes Take 1 capsule (100 mg total) by mouth 2 (two) times daily. [name], NP Taking Active   furosemide (LASIX) 40 MG tablet [id number] Yes Take 1 tablet (40 mg total) by mouth daily. [name], NP Taking Active   HYDROcodone-acetaminophen (NORCO/VICODIN) 5-325 MG tablet [id number] Yes Take 1 tablet by mouth every 4 (four) hours as needed. Evalee Jefferson, PA-C Taking Active   isosorbide-hydrALAZINE (BIDIL) 20-37.5 MG tablet [id number] Yes Take 1 tablet by mouth 2 (two) times daily. [name], NP Taking Active   montelukast (SINGULAIR) 10 MG tablet [id number] Yes Take 1 tablet (10 mg total) by mouth at bedtime. [name], NP Taking Active   ondansetron (ZOFRAN) 4 MG tablet [id number]  Take 1 tablet (4 mg total) by mouth every 8 (eight) hours as needed for nausea or vomiting.  Patient not taking: Reported on 11/26/2021   [name], NP  Active   promethazine-dextromethorphan (PROMETHAZINE-DM) 6.25-15 MG/5ML syrup [id number] Yes Take 5 mLs by mouth at bedtime as needed for cough. Jaynee Eagles, PA-C Taking Active   Semaglutide,0.25 or 0.'5MG'$ /DOS, (OZEMPIC, 0.25 OR 0.5 MG/DOSE,) 2 MG/3ML SOPN [id number] Yes Inject 0.25 mg weekly for 4 weeks, then increase to 0.5 mg weekly [name], NP Taking Active               Assessment/Plan:   Gout Flare: - Reviewed renal function. Doses of allopurinol and colchicine should be reduced. Discussed with PCP, discussed Urgent Care vs Mobile Medicine Unit. Unfortunately, MMU is broken down today, and patient admits that she would not be able to make it to UC. Discussed side  effects of higher doses of colchicine or allopurinol. Will reduce allopurinol to 50 mg twice weekly and colchicine 0.3 mg once today, and then every 3 days until flare is resolved. Patient verbalized understanding.   Diabetes: - Currently controlled - Recommend to increase Ozempic to 0.5 mg next week as scheduled.  - Encouraged to consider weighing, though celebrated non-number related win of improved fit of clothes.    Hypertension: - Currently uncontrolled due to nonadherence - Discussed options to improve adherence including setting an alarm to remind her to take her medications, placing her pill box somewhere that she sees often - Recommend to continue current regimen at this time   Follow Up Plan: phone call in 1 week  Catie TJodi Mourning, PharmD, [place] [phone]

## 2021-12-17 ENCOUNTER — Ambulatory Visit (INDEPENDENT_AMBULATORY_CARE_PROVIDER_SITE_OTHER): Payer: Medicaid Other | Admitting: Clinical

## 2021-12-17 DIAGNOSIS — F332 Major depressive disorder, recurrent severe without psychotic features: Secondary | ICD-10-CM

## 2021-12-17 NOTE — BH Specialist Note (Addendum)
Integrated Behavioral Health Follow Up In-Person Visit  MRN: [id number] Name: Margaret Aguilar [facility]  Number of [place] Clinician visits: 2- Second Visit  Session Start time: 0910   Session End time: 6256  Total time in minutes: 45   Types of Service: Individual psychotherapy  Interpretor:No. Interpretor Name and Language: none  Subjective: Margaret Aguilar is a 61 y.o. female accompanied by  self. Patient was referred by Lazaro Arms, NP for depression related to health conditions. Patient reports the following symptoms/concerns: depression, anxiety, history of trauma Duration of problem: several years; Severity of problem: severe  Objective: Mood: Euthymic and Affect: Appropriate Risk of harm to self or others: No plan to harm self or others  Patient and/or Family's Strengths/Protective Factors: Social connections, Social and Emotional competence, Concrete supports in place (healthy food, safe environments, etc.), and Sense of purpose  Goals Addressed: Patient will:  Reduce symptoms of: anxiety and depression Increase knowledge and/or ability of: coping skills and self-management skills  Demonstrate ability to: Increase healthy adjustment to current life circumstances  Progress towards Goals: Ongoing  Interventions: Interventions utilized:  Mindfulness or Psychologist, educational and CBT Cognitive Behavioral Therapy Standardized Assessments completed: Not Needed  CBT today to explore thoughts and emotions related to interpersonal dynamics with spouse. Patient experiencing somatic responses to stress related to challenging dynamics. Mindful breathing exercise practiced today for nervous system regulation.  Patient and/or Family Response: Patient engaged in session.   Assessment: Patient currently experiencing depression related to health conditions, relationship dynamics, and history of trauma.   Patient may benefit from CBT to explore thoughts  and emotions and development of coping skills for living with chronic illnesses.   Plan: Follow up with behavioral health clinician on : 12/30/21  Estanislado Emms, [facility] [phone]

## 2021-12-20 ENCOUNTER — Other Ambulatory Visit: Payer: Medicaid Other | Admitting: Pharmacist

## 2021-12-20 ENCOUNTER — Telehealth: Payer: Self-pay | Admitting: Pharmacist

## 2021-12-20 NOTE — Progress Notes (Signed)
Attempted to contact patient for scheduled appointment for medication management. Left HIPAA compliant message for patient to return my call at their convenience.   Catie T. Jaquanna Ballentine, PharmD, BCACP [facility] [phone]  

## 2021-12-22 NOTE — Progress Notes (Unsigned)
12/23/2021 Margaret Aguilar [id number] 1960/08/15   CHIEF COMPLAINT: Schedule colonoscopy  HISTORY OF PRESENT ILLNESS: Margaret Aguilar is a 61 year old female with a past medical history of anxiety, depression, arthritis, gout, asthma, hypertension, CKD stage V s/p AV fistula placement 10/2018 not on hemodialysis, DM II, chronic iron deficiency anemia, sleep apnea uses CPAP, GERD and a tubular adenomatous polyp. Past C section and tubal ligation. She presents today as referred by [name] NP to schedule a colonoscopy due to prior history of a pedunculated tubular adenomatous polyp 2015. Her requested Dr. Havery Moros to be her primary gastroenterologist as he sees her husband.  She underwent a colonoscopy by Dr. Laural Golden in [place] 01/12/2014 which identified a 15 mm pedunculated tubular adenomatous polyp which was removed at the hepatic flexure, diverticula was noted in the sigmoid colon and the area behind the ileocecal valve was not seen.  A virtual colonoscopy was recommended in 4 weeks but was not done.  A repeat colonoscopy in 5 years was recommended and was not done.  Her bowel pattern varies.  She has occasional constipation then has diarrhea 1-2 episodes for a few days.  She sometimes skips a day or 2 without passing a bowel movement.  No rectal bleeding or black stools.  She has a history of GERD for the past 5 years.  She describes having heartburn daily for the 2-week intervals which comes and goes over the past 2 to 3 years.  She has infrequent episodes of food regurgitation which last occurred 2 weeks ago after eating chicken wings.  Food does not get stuck in the esophagus but after she swallows the food comes back up in these episodes.  She has some difficulty chewing food as her dentures do not fit properly.  She possibly had an EGD many years ago, further details are unclear.  She has occasional lower abdominal discomfort, no severe abdominal pain.  She started Ozempic for weight  loss 11/19/2021.  She CKD with normocytic anemia.  She is followed by nephrologist Coladonato at [facility].  She intermittently receives IV iron, last received Feraheme on 06/18/2021. She intends to undergo renal transplant evaluation at [facility] but she stated she had to lose weight prior to being considered for a kidney transplant.  Goal weight is 212 pounds.  Her weight today is 222 pounds.   She went to the ED on 11/19/2021 due to feeling light headed with nausea and vomiting after she received the first dose of Carvedilol and Ozempic she also noted having left chest pain.  Labs showed a hemoglobin level of 11.  Glucose 115.  BUN 81.  Creatinine 7.02.  Calcium 8.6.  Total bili 0.6.  Alk phos 88.  AST 14.  ALT 9.  EKG showed SR with PVCs.  She left the ED before her evaluation was completed.  He has not been seen by her cardiologist Dr. [name] for 2 to 3 years.     Latest Ref Rng & Units 11/19/2021    8:03 PM 11/15/2021    9:21 AM 07/01/2021    9:38 AM  CBC  WBC 4.0 - 10.5 K/uL 8.8  8.9  8.1   Hemoglobin 12.0 - 15.0 g/dL 11.0  11.9  10.3   Hematocrit 36.0 - 46.0 % 35.3  36.3  33.1   Platelets 150 - 400 K/uL 245  246  222   MCV 83.8.     Latest Ref Rng & Units 11/19/2021  8:03 PM 11/15/2021    9:21 AM 07/01/2021    9:38 AM  CMP  Glucose 70 - 99 mg/dL 115  95  97   BUN 6 - 20 mg/dL 81  67  57   Creatinine 0.44 - 1.00 mg/dL 7.02  6.37  5.43   Sodium 135 - 145 mmol/L 140  143  139   Potassium 3.5 - 5.1 mmol/L 4.0  4.3  3.9   Chloride 98 - 111 mmol/L 110  107  109   CO2 22 - 32 mmol/L '19  19  18   ' Calcium 8.9 - 10.3 mg/dL 8.6  9.1  8.9   Total Protein 6.5 - 8.1 g/dL 6.7  7.2    Total Bilirubin 0.3 - 1.2 mg/dL 0.6  0.3    Alkaline Phos 38 - 126 U/L 88  109    AST 15 - 41 U/L 14  13    ALT 0 - 44 U/L 9  9      ECHO 08/17/2018: 1. The left ventricle has normal systolic function, with an ejection  fraction of 60-65%. The cavity size was normal. There is mildly increased   left ventricular wall thickness. Left ventricular diastolic parameters  were normal.   2. The right ventricle has normal systolc function. The cavity was  normal. There is no increase in right ventricular wall thickness.   3. Left atrial size was moderately dilated.   4. Mild thickening of the mitral valve leaflet.   5. The aortic valve is tricuspid Mild thickening of the aortic valve Mild  calcification of the aortic valve. Aortic valve regurgitation is mild by  color flow Doppler.   6. Mild pulmonic stenosis.   7. The aortic root is normal in size and structure.   Colonoscopy 01/12/2014 by Dr. Laural Golden: 1. 15 mm pedunculated polyp was found at the hepatic flexure; polypectomy was performed using snare cautery 2. 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 Area behind the ileocecal valve not seen.  Virtual colonoscopy was ordered but was not completed. -Recall colonoscopy 5 years  -virtual colonoscopy in 4 weeks. - TUBULAR ADENOMA (ONE FRAGMENT), 1.5 CM. NO HIGH GRADE DYSPLASIA OR MALIGNANCY IDENTIFIED. - APPRECIABLE RESECTION MARGIN, NEGATIVE FOR DYSPLASIA OR MALIGNANCY.  Past Medical History:  Diagnosis Date   Anemia    Anxiety    Arthritis    Asthma    Back pain    Bradycardia    Depression    Diabetes ([place])    Diabetes mellitus without complication ([place])    x 5 yrs   Drug use    GERD (gastroesophageal reflux disease)    Gout    Herpes    High cholesterol    HLD (hyperlipidemia)    Hypertension    Joint pain    Kidney disease    Stage 4   Kidney disease, chronic, stage IV (severe, EGFR 15-29 ml/min) (HCC)    Palpitations    Pneumonia    several   Sleep apnea    hasn't gotten cpap yet   SOB (shortness of breath)    Vitamin D-dependent rickets    Past Surgical History:  Procedure Laterality Date   AV FISTULA PLACEMENT Left 10/19/2018   Procedure: LEFT ARTERIOVENOUS (AV) FISTULA CREATION;  Surgeon: Waynetta Sandy, MD;  Location: [place];  Service: Vascular;   Laterality: Left;   [place] Left 06/30/2019   Procedure: Bascilic Vein Transposition;  Surgeon: Waynetta Sandy, MD;  Location: [place];  Service:  Vascular;  Laterality: Left;   CESAREAN SECTION     x 1   COLONOSCOPY N/A 01/12/2014   Procedure: COLONOSCOPY;  Surgeon: Rogene Houston, MD;  [facility];  Service: Endoscopy;  Laterality: N/A;  225   D&C     TUBAL LIGATION     Social History: She is married.  She has 5 sons and 1 daughter.  She has 13 grandchildren.  Non-smoker.  No alcohol use.  She rarely smokes marijuana when experiencing severe pain.  No other drug use.  Family History: family history includes Alcoholism in her father; Anxiety disorder in her mother; Depression in her mother; Diabetes in her brother and mother; Drug abuse in her father; Eating disorder in her mother; Heart disease in her father; Hyperlipidemia in her father and mother; Hypertension in her father; Obesity in her mother; Sleep apnea in her father, mother, and sister; Stroke in her brother.  No known family history of esophageal, gastric or colon cancer.  Allergies  Allergen Reactions   Nutritional Supplements     Walnut >> UNSPECIFIED REACTION  Severity from PMH   Latex Swelling and Rash    SWELLING REACTION UNSPECIFIED       Outpatient Encounter Medications as of 12/23/2021  Medication Sig   albuterol (PROVENTIL) (2.5 MG/3ML) 0.083% nebulizer solution Take 3 mLs (2.5 mg total) by nebulization every 6 (six) hours as needed for wheezing or shortness of breath.   albuterol (VENTOLIN HFA) 108 (90 Base) MCG/ACT inhaler Inhale 2 puffs into the lungs every 6 (six) hours as needed for wheezing or shortness of breath.   allopurinol (ZYLOPRIM) 100 MG tablet Take 0.5 tablets (50 mg total) by mouth 2 (two) times a week.   amLODipine (NORVASC) 10 MG tablet Take 1 tablet (10 mg total) by mouth daily.   benzonatate (TESSALON) 100 MG capsule Take 1-2 capsules (100-200 mg total) by  mouth 3 (three) times daily as needed for cough.   calcitRIOL (ROCALTROL) 0.25 MCG capsule Take 0.25 mcg by mouth daily.   carvedilol (COREG) 12.5 MG tablet Take 1 tablet (12.5 mg total) by mouth 2 (two) times daily with a meal.   colchicine 0.6 MG tablet Take 0.3 mg once, and then every 3 days until gout flare is resolved   furosemide (LASIX) 40 MG tablet Take 1 tablet (40 mg total) by mouth daily.   HYDROcodone-acetaminophen (NORCO/VICODIN) 5-325 MG tablet Take 1 tablet by mouth every 4 (four) hours as needed.   isosorbide-hydrALAZINE (BIDIL) 20-37.5 MG tablet Take 1 tablet by mouth 2 (two) times daily.   montelukast (SINGULAIR) 10 MG tablet Take 1 tablet (10 mg total) by mouth at bedtime.   Semaglutide,0.25 or 0.5MG/DOS, (OZEMPIC, 0.25 OR 0.5 MG/DOSE,) 2 MG/3ML SOPN Inject 0.25 mg weekly for 4 weeks, then increase to 0.5 mg weekly   docusate sodium (COLACE) 100 MG capsule Take 1 capsule (100 mg total) by mouth 2 (two) times daily. (Patient not taking: Reported on 12/23/2021)   ondansetron (ZOFRAN) 4 MG tablet Take 1 tablet (4 mg total) by mouth every 8 (eight) hours as needed for nausea or vomiting. (Patient not taking: Reported on 11/26/2021)   promethazine-dextromethorphan (PROMETHAZINE-DM) 6.25-15 MG/5ML syrup Take 5 mLs by mouth at bedtime as needed for cough. (Patient not taking: Reported on 12/23/2021)   No facility-administered encounter medications on file as of 12/23/2021.    REVIEW OF SYSTEMS:  Gen: + Intentional weight loss. + Fatigue. Denies fever, sweats or chills.  CV: Denies chest pain, palpitations  or edema. Resp: Denies cough, shortness of breath of hemoptysis.  GI: See HPI. GU : Denies urinary burning, blood in urine, increased urinary frequency or incontinence. MS: + Arthritis.  Derm: Denies rash, itchiness, skin lesions or unhealing ulcers. Psych: + Anxiety.  Heme: Denies bruising, easy bleeding. Neuro:  Denies headaches, dizziness or paresthesias. Endo:  + DM II,  excessive thrist.  PHYSICAL EXAM: BP (!) 130/90   Pulse 68   Ht '5\' 1"'  (1.549 m)   Wt 222 lb (100.7 kg)   LMP  (LMP Unknown)   SpO2 98%   BMI 41.95 kg/m   Wt Readings from Last 3 Encounters:  12/23/21 222 lb (100.7 kg)  11/18/21 230 lb (104.3 kg)  11/15/21 230 lb 8 oz (104.6 kg)     General: 61 year old female in no acute distress. Head: Normocephalic and atraumatic. Eyes:  Sclerae non-icteric, conjunctive pink. Ears: Normal auditory acuity. Mouth: Scattered missing dentition.  Is not currently in use.  No ulcers or lesions.  Neck: Supple, no lymphadenopathy or thyromegaly.  Lungs: Clear bilaterally to auscultation without wheezes, crackles or rhonchi. Heart: Irregular rhythm.  No murmur, rub or gallop appreciated.  Abdomen: Soft, nondistended.  Mild epigastric tenderness without rebound or guarding.  No masses. No hepatosplenomegaly. Normoactive bowel sounds x 4 quadrants.  Rectal: Deferred. Musculoskeletal: Symmetrical with no gross deformities. Skin: Warm and dry. No rash or lesions on visible extremities. Extremities: No edema.  LUE fistula with positive bruit and thrill. Neurological: Alert oriented x 4, no focal deficits.  Psychological:  Alert and cooperative. Normal mood and affect.  ASSESSMENT AND PLAN:  24) 61 year old female with a history of a 102m tubular adenomatous polyp per colonoscopy in 2015 [facility] including risk with sedation, risk of bleeding, perforation and infection.  Cardiac clearance quired prior to the patient proceeding with a colonoscopy.  2) GERD, episode regurgitation  [facility] risk with sedation, risk of bleeding, perforation and infection.  Cardiac clearance required prior to the patient proceeding with an EGD.  3) CKD stage V. LUE fistula place in 2020 in preparation for eventual dialysis. Not yet on hemodialysis. [facility]   4) Irregular rhythm on exam today, suspect PVCs. Seen in the ED 11/19/2021 with N/V, light headed ness and left chest pain. EKG showed NSR with unifocal PVC, no acute ischemia. She left the ED prior to completing her evaluation.  She associated her symptoms and due to taking her first doses of carvedilol and Ozempic on 11/19/2021.  No further chest pain. -Patient instructed to follow up with her PCP regarding irregular heart rhythm -Patient aware cardiac clearance requested prior to scheduling an EGD and colonoscopy   5) DM II. Hg A1C 5.5 on 11/15/2021  6) Chronic IDA in setting of  CKD. No overt GI bleeding. -EGD and colonoscopy as ordered above   CC:  NFenton Foy NP

## 2021-12-23 ENCOUNTER — Ambulatory Visit: Payer: Medicaid Other | Admitting: Nurse Practitioner

## 2021-12-23 ENCOUNTER — Encounter: Payer: Self-pay | Admitting: Nurse Practitioner

## 2021-12-23 VITALS — BP 130/90 | HR 68 | Ht 61.0 in | Wt 222.0 lb

## 2021-12-23 DIAGNOSIS — K219 Gastro-esophageal reflux disease without esophagitis: Secondary | ICD-10-CM

## 2021-12-23 DIAGNOSIS — N185 Chronic kidney disease, stage 5: Secondary | ICD-10-CM

## 2021-12-23 DIAGNOSIS — Z86010 Personal history of colonic polyps: Secondary | ICD-10-CM | POA: Diagnosis not present

## 2021-12-23 NOTE — Progress Notes (Signed)
Margaret Aguilar, can you please send an official cardiac clearance request to Dr. [name] which is required prior to the patient proceeding with an EGD and colonoscopy at [facility].  Patient is aware that a cardiac clearance is required. Thank you.

## 2021-12-23 NOTE — Patient Instructions (Signed)
If you are age 59 or older, your body mass index should be between 23-30. Your Body mass index is 41.95 kg/m. If this is out of the aforementioned range listed, please consider follow up with your Primary Care Provider.  If you are age 107 or younger, your body mass index should be between 19-25. Your Body mass index is 41.95 kg/m. If this is out of the aformentioned range listed, please consider follow up with your Primary Care Provider.   ________________________________________________________  The [facility] providers would like to encourage you to use [facility] to communicate with providers for non-urgent requests or questions.  Due to long hold times on the telephone, sending your provider a message by [facility] may be a faster and more efficient way to get a response.  Please allow 48 business hours for a response.  Please remember that this is for non-urgent requests.  _______________________________________________________   Follow up with primary care physician regarding irregular heart rhythm.  Contact your nephrologist to verify if it is ok to take Pepcid 20 mg by mouth once daily.  Our office with contact you regarding your hospital date to be scheduled for your endoscopy & colonoscopy.  It was a pleasure to see you today!  Thank you for trusting me with your gastrointestinal care!

## 2021-12-23 NOTE — Progress Notes (Signed)
Agree with assessment and plan as outlined.  Given her case needs to be done at the hospital, there is currently a few months wait time until we have access for procedure there, however if cancellations in the interim will contact her to be scheduled.  Thanks.  Otherwise agree with cardiac preprocedure clearance

## 2021-12-24 ENCOUNTER — Telehealth: Payer: Self-pay

## 2021-12-24 DIAGNOSIS — I499 Cardiac arrhythmia, unspecified: Secondary | ICD-10-CM

## 2021-12-24 NOTE — Telephone Encounter (Signed)
Primary Cardiologist:None  Chart reviewed as part of pre-operative protocol coverage. Because of Margaret Aguilar's past medical history and time since last visit, he/she will require a follow-up visit in order to better assess preoperative cardiovascular risk.  Pre-op covering staff: - She has never been seen in our clinic. Please schedule appointment and call patient to inform them. - Please contact requesting surgeon's office via preferred method (i.e, phone, fax) to inform them of need for appointment prior to surgery.   Emmaline Life, NP-C     12/24/2021, 12:49 PM [address], [address] Office [phone] Fax [phone]

## 2021-12-24 NOTE — Telephone Encounter (Signed)
Ambulatory referral to Cardiology in epic.

## 2021-12-24 NOTE — Telephone Encounter (Signed)
[facility] Pre-operative Risk Assessment     Kinslei Labine [facility] 03/01/1961 [id number]  Procedure: Upper Endoscopy/Colonoscopy Anesthesia type:  MAC Procedure Date: To Be Determined Provider: Dr. Havery Moros  Type of Clearance needed: Cardiac  Medication(s) needing held: N/A   Length of time for medication to be held: N/A  Please review request and advise by either responding to this message or by sending your response to the fax # provided below.  Thank you,  [facility]  Phone: [phone] Fax: [phone] ATTENTION: Gillermina Hu RN

## 2021-12-24 NOTE — Telephone Encounter (Signed)
Margaret Aguilar is a new patient that Jaclyn Shaggy saw her recently, she discussed her case with me.  Can you please place a referral for this patient to see cardiology for preprocedure evaluation/clearance.  Thanks

## 2021-12-24 NOTE — Telephone Encounter (Signed)
We will need Dr. Havery Moros office to fax over a referral to cardiology. Once the referral is received, a new pt appt will then need to be scheduled. I will send a message to our chart prep team in regard to needing referral to cards as well as new pt appt.

## 2021-12-27 NOTE — Telephone Encounter (Addendum)
Will send message to chart prep team in regard to referral and will need NEW PT appt for pre op clearance.

## 2021-12-30 ENCOUNTER — Ambulatory Visit: Payer: Medicaid Other | Admitting: Clinical

## 2021-12-31 NOTE — Telephone Encounter (Signed)
Message sent to scheduling team to help find new pt appt for pre op clearance

## 2021-12-31 NOTE — Telephone Encounter (Signed)
I s/w with our scheduling team. Please see notes below from scheduling. I will update the requesting office they will need to find out the pt is seeing for cardiology as she states is not following our practice.     Leah Newnam: scheduling team I spoke to her on Friday and closed the referral, she said she has another cardiologist that she see's

## 2021-12-31 NOTE — Telephone Encounter (Signed)
[place] can you please contact this patient to clarify which cardiologist she sees? She needs clearance to schedule a procedure, her referral was closed to [facility] cardiology as she told them she is seeing another cardiologist. Thanks

## 2022-01-01 NOTE — Telephone Encounter (Signed)
Unable to reach pt: Phone rings and then goes directly to busy signal:

## 2022-01-02 NOTE — Telephone Encounter (Signed)
Unable to reach pt: Phone rings and then goes directly to busy signal:  My Chart Message sent to pt requesting the name of her current Cardiologist:

## 2022-01-03 NOTE — Telephone Encounter (Signed)
Unable to reach pt: Phone rings and then goes directly to busy signal:

## 2022-01-04 DIAGNOSIS — G4733 Obstructive sleep apnea (adult) (pediatric): Secondary | ICD-10-CM | POA: Diagnosis not present

## 2022-01-05 ENCOUNTER — Emergency Department (HOSPITAL_COMMUNITY)
Admission: EM | Admit: 2022-01-05 | Discharge: 2022-01-05 | Disposition: A | Discharge status: Home / Self Care | Payer: Medicaid Other | Attending: Emergency Medicine | Admitting: Emergency Medicine

## 2022-01-05 ENCOUNTER — Other Ambulatory Visit: Payer: Self-pay

## 2022-01-05 ENCOUNTER — Encounter (HOSPITAL_COMMUNITY): Payer: Self-pay | Admitting: Emergency Medicine

## 2022-01-05 ENCOUNTER — Encounter: Payer: Self-pay | Admitting: Emergency Medicine

## 2022-01-05 ENCOUNTER — Emergency Department (HOSPITAL_COMMUNITY): Payer: Medicaid Other

## 2022-01-05 ENCOUNTER — Ambulatory Visit
Admission: EM | Admit: 2022-01-05 | Discharge: 2022-01-05 | Disposition: A | Discharge status: Home / Self Care | Payer: Medicaid Other

## 2022-01-05 DIAGNOSIS — B379 Candidiasis, unspecified: Secondary | ICD-10-CM | POA: Insufficient documentation

## 2022-01-05 DIAGNOSIS — Z794 Long term (current) use of insulin: Secondary | ICD-10-CM | POA: Insufficient documentation

## 2022-01-05 DIAGNOSIS — N185 Chronic kidney disease, stage 5: Secondary | ICD-10-CM | POA: Insufficient documentation

## 2022-01-05 DIAGNOSIS — N3000 Acute cystitis without hematuria: Secondary | ICD-10-CM

## 2022-01-05 DIAGNOSIS — N281 Cyst of kidney, acquired: Secondary | ICD-10-CM | POA: Insufficient documentation

## 2022-01-05 DIAGNOSIS — E1122 Type 2 diabetes mellitus with diabetic chronic kidney disease: Secondary | ICD-10-CM | POA: Insufficient documentation

## 2022-01-05 DIAGNOSIS — R112 Nausea with vomiting, unspecified: Secondary | ICD-10-CM

## 2022-01-05 DIAGNOSIS — Z91040 Latex allergy status: Secondary | ICD-10-CM | POA: Diagnosis not present

## 2022-01-05 DIAGNOSIS — N289 Disorder of kidney and ureter, unspecified: Secondary | ICD-10-CM | POA: Diagnosis not present

## 2022-01-05 DIAGNOSIS — I10 Essential (primary) hypertension: Secondary | ICD-10-CM | POA: Diagnosis not present

## 2022-01-05 LAB — COMPREHENSIVE METABOLIC PANEL
ALT: 12 U/L (ref 0–44)
AST: 16 U/L (ref 15–41)
Albumin: 4.1 g/dL (ref 3.5–5.0)
Alkaline Phosphatase: 77 U/L (ref 38–126)
Anion gap: 10 (ref 5–15)
BUN: 76 mg/dL — ABNORMAL HIGH (ref 6–20)
CO2: 19 mmol/L — ABNORMAL LOW (ref 22–32)
Calcium: 8.7 mg/dL — ABNORMAL LOW (ref 8.9–10.3)
Chloride: 109 mmol/L (ref 98–111)
Creatinine, Ser: 7.4 mg/dL — ABNORMAL HIGH (ref 0.44–1.00)
GFR, Estimated: 6 mL/min — ABNORMAL LOW (ref >60)
Glucose, Bld: 83 mg/dL (ref 70–99)
Potassium: 3.7 mmol/L (ref 3.5–5.1)
Sodium: 138 mmol/L (ref 135–145)
Total Bilirubin: 0.8 mg/dL (ref 0.3–1.2)
Total Protein: 7.2 g/dL (ref 6.5–8.1)

## 2022-01-05 LAB — CBC WITH DIFFERENTIAL/PLATELET
Abs Immature Granulocytes: 0.02 10*3/uL (ref 0.00–0.07)
Basophils Absolute: 0 10*3/uL (ref 0.0–0.1)
Basophils Relative: 0 %
Eosinophils Absolute: 0.1 10*3/uL (ref 0.0–0.5)
Eosinophils Relative: 1 %
HCT: 39.2 % (ref 36.0–46.0)
Hemoglobin: 12.3 g/dL (ref 12.0–15.0)
Immature Granulocytes: 0 %
Lymphocytes Relative: 28 %
Lymphs Abs: 1.6 10*3/uL (ref 0.7–4.0)
MCH: 25.8 pg — ABNORMAL LOW (ref 26.0–34.0)
MCHC: 31.4 g/dL (ref 30.0–36.0)
MCV: 82.2 fL (ref 80.0–100.0)
Monocytes Absolute: 0.4 10*3/uL (ref 0.1–1.0)
Monocytes Relative: 7 %
Neutro Abs: 3.5 10*3/uL (ref 1.7–7.7)
Neutrophils Relative %: 64 %
Platelets: 217 10*3/uL (ref 150–400)
RBC: 4.77 MIL/uL (ref 3.87–5.11)
RDW: 15.9 % — ABNORMAL HIGH (ref 11.5–15.5)
WBC: 5.6 10*3/uL (ref 4.0–10.5)
nRBC: 0 % (ref 0.0–0.2)

## 2022-01-05 LAB — URINALYSIS, ROUTINE W REFLEX MICROSCOPIC
Bilirubin Urine: NEGATIVE
Glucose, UA: NEGATIVE mg/dL
Ketones, ur: NEGATIVE mg/dL
Nitrite: NEGATIVE
Protein, ur: 100 mg/dL — AB
Specific Gravity, Urine: 1.01 (ref 1.005–1.030)
WBC, UA: >50 WBC/hpf — ABNORMAL HIGH (ref 0–5)
pH: 5 (ref 5.0–8.0)

## 2022-01-05 LAB — POC URINE PREG, ED: Preg Test, Ur: NEGATIVE

## 2022-01-05 MED ORDER — CEPHALEXIN 250 MG PO CAPS: 250.0000 mg | ORAL_CAPSULE | Freq: Two times a day (BID) | ORAL | 0 refills | Status: AC

## 2022-01-05 MED ORDER — FLUCONAZOLE 150 MG PO TABS: 150.0000 mg | ORAL_TABLET | Freq: Every day | ORAL | 0 refills | Status: AC

## 2022-01-05 MED ORDER — FLUCONAZOLE 150 MG PO TABS
150.0000 mg | ORAL_TABLET | Freq: Once | ORAL | Status: AC
Start: 2022-01-05 — End: 2022-01-05
  Administered 2022-01-05: 150 mg via ORAL
  Filled 2022-01-05: qty 1

## 2022-01-05 MED ORDER — ONDANSETRON HCL 4 MG/2ML IJ SOLN
4.0000 mg | Freq: Once | INTRAMUSCULAR | Status: AC
  Administered 2022-01-05: 4 mg via INTRAVENOUS
  Filled 2022-01-05: qty 2

## 2022-01-05 MED ORDER — PANTOPRAZOLE SODIUM 40 MG IV SOLR
40.0000 mg | Freq: Once | INTRAVENOUS | Status: AC
  Administered 2022-01-05: 40 mg via INTRAVENOUS
  Filled 2022-01-05: qty 10

## 2022-01-05 MED ORDER — SODIUM CHLORIDE 0.9 % IV SOLN
1.0000 g | Freq: Once | INTRAVENOUS | Status: AC
  Administered 2022-01-05: 1 g via INTRAVENOUS
  Filled 2022-01-05: qty 10

## 2022-01-05 NOTE — ED Triage Notes (Addendum)
Vomiting for the past 3 days.  Occurs about every 2-3 after eating.  History of the same.  Denies pain.  Has not contacted provider.  Has not used any medicines at home for nausea  Did not take any medicines today.  "Sometimes I remember, sometimes I dont"

## 2022-01-05 NOTE — Discharge Instructions (Addendum)
Please follow-up with PCP and get MRI for renal mass/cysts. Please take fluconazole after completion of antibiotics. Return if you develop worsening pain, fever, or chills.

## 2022-01-05 NOTE — ED Provider Notes (Signed)
[facility] EMERGENCY DEPARTMENT Provider Note   CSN: [id number] Arrival date & time: 01/05/22  1559     History  Chief Complaint  Patient presents with   Emesis    Margaret Aguilar is a 61 y.o. female, who presents to the ED 2/2 nausea and vomiting for the last 3 days.  She states for the last 3 days she has vomited about an hour after she eats, around twice a day.  Vomitus is nonbloody, no associated abdominal pain, fever, chills.  Reports that she had a bowel movement today, which relieve her nausea a little bit, but has not had a bowel movement for the last 3 days prior to today.  Has not taken anything for the nausea.  States that she used to have Zofran for her CKD, but has not taken it for some time.  Notes she is not on dialysis, they are waiting until she has this GFR of 5.  Denies any sour taste after eating.   Emesis Associated symptoms: no abdominal pain        Home Medications Prior to Admission medications   Medication Sig Start Date End Date Taking? Authorizing Provider  cephALEXin (KEFLEX) 250 MG capsule Take 1 capsule (250 mg total) by mouth 2 (two) times daily for 7 days. 01/05/22 01/12/22 Yes Vedh Ptacek L, PA  fluconazole (DIFLUCAN) 150 MG tablet Take 1 tablet (150 mg total) by mouth daily for 1 day. 01/05/22 01/06/22 Yes Cigi Bega L, PA  albuterol (PROVENTIL) (2.5 MG/3ML) 0.083% nebulizer solution Take 3 mLs (2.5 mg total) by nebulization every 6 (six) hours as needed for wheezing or shortness of breath. 12/22/19   [name], NP  albuterol (VENTOLIN HFA) 108 (90 Base) MCG/ACT inhaler Inhale 2 puffs into the lungs every 6 (six) hours as needed for wheezing or shortness of breath. 05/15/21   [name], NP  allopurinol (ZYLOPRIM) 100 MG tablet Take 0.5 tablets (50 mg total) by mouth 2 (two) times a week. 12/12/21   [name], NP  amLODipine (NORVASC) 10 MG tablet Take 1 tablet (10 mg total) by mouth daily. 11/15/21 02/13/22  [name], NP   benzonatate (TESSALON) 100 MG capsule Take 1-2 capsules (100-200 mg total) by mouth 3 (three) times daily as needed for cough. 07/12/21   Jaynee Eagles, PA-C  calcitRIOL (ROCALTROL) 0.25 MCG capsule Take 0.25 mcg by mouth daily.    [provider]  carvedilol (COREG) 12.5 MG tablet Take 1 tablet (12.5 mg total) by mouth 2 (two) times daily with a meal. 11/15/21   [name], NP  colchicine 0.6 MG tablet Take 0.3 mg once, and then every 3 days until gout flare is resolved 12/11/21   [name], NP  docusate sodium (COLACE) 100 MG capsule Take 1 capsule (100 mg total) by mouth 2 (two) times daily. Patient not taking: Reported on 12/23/2021 08/15/21   [name], NP  furosemide (LASIX) 40 MG tablet Take 1 tablet (40 mg total) by mouth daily. 11/15/21 02/13/22  [name], NP  HYDROcodone-acetaminophen (NORCO/VICODIN) 5-325 MG tablet Take 1 tablet by mouth every 4 (four) hours as needed. 07/01/21   Idol, [name], PA-C  isosorbide-hydrALAZINE (BIDIL) 20-37.5 MG tablet Take 1 tablet by mouth 2 (two) times daily. 11/15/21 02/13/22  [name], NP  montelukast (SINGULAIR) 10 MG tablet Take 1 tablet (10 mg total) by mouth at bedtime. 11/15/21 11/15/22  [name], NP  ondansetron (ZOFRAN) 4 MG  tablet Take 1 tablet (4 mg total) by mouth every 8 (eight) hours as needed for nausea or vomiting. Patient not taking: Reported on 11/26/2021 08/15/21   [name], NP  promethazine-dextromethorphan (PROMETHAZINE-DM) 6.25-15 MG/5ML syrup Take 5 mLs by mouth at bedtime as needed for cough. Patient not taking: Reported on 12/23/2021 07/12/21   Jaynee Eagles, PA-C  Semaglutide,0.25 or 0.'5MG'$ /DOS, (OZEMPIC, 0.25 OR 0.5 MG/DOSE,) 2 MG/3ML SOPN Inject 0.25 mg weekly for 4 weeks, then increase to 0.5 mg weekly 11/15/21   [name], NP      Allergies    Nutritional supplements and Latex    Review of Systems   Review of Systems  Gastrointestinal:  Positive for constipation, nausea and vomiting.  Negative for abdominal pain.    Physical Exam Updated Vital Signs BP (!) 140/103 (BP Location: Right Arm)   Pulse (!) 54   Temp 98.4 F (36.9 C) (Oral)   Resp 19   Ht '5\' 1"'$  (1.549 m)   Wt 100.7 kg   LMP  (LMP Unknown)   SpO2 98%   BMI 41.95 kg/m  Physical Exam Constitutional:      Appearance: Normal appearance.  HENT:     Head: Normocephalic and atraumatic.     Nose: Nose normal.     Mouth/Throat:     Mouth: Mucous membranes are moist.  Eyes:     Extraocular Movements: Extraocular movements intact.     Pupils: Pupils are equal, round, and reactive to light.  Cardiovascular:     Rate and Rhythm: Normal rate and regular rhythm.  Pulmonary:     Effort: Pulmonary effort is normal.     Breath sounds: Normal breath sounds.  Abdominal:     General: Abdomen is flat.     Palpations: Abdomen is soft.     Tenderness: There is abdominal tenderness in the left upper quadrant. There is no guarding or rebound.  Musculoskeletal:     Cervical back: Normal range of motion.  Neurological:     Mental Status: She is alert.     ED Results / Procedures / Treatments   Labs (all labs ordered are listed, but only abnormal results are displayed) Labs Reviewed  COMPREHENSIVE METABOLIC PANEL - Abnormal; Notable for the following components:      Result Value   CO2 19 (*)    BUN 76 (*)    Creatinine, Ser 7.40 (*)    Calcium 8.7 (*)    GFR, Estimated 6 (*)    All other components within normal limits  CBC WITH DIFFERENTIAL/PLATELET - Abnormal; Notable for the following components:   MCH 25.8 (*)    RDW 15.9 (*)    All other components within normal limits  URINALYSIS, ROUTINE W REFLEX MICROSCOPIC - Abnormal; Notable for the following components:   APPearance CLOUDY (*)    Hgb urine dipstick [place] Heckler (*)    Protein, ur 100 (*)    Leukocytes,Ua LARGE (*)    WBC, UA >50 (*)    Bacteria, UA MANY (*)    All other components within normal limits  URINE CULTURE  POC URINE PREG, ED     EKG None  Radiology CT Renal Stone Study  Result Date: 01/05/2022 CLINICAL DATA:  Flank pain; kidney stones suspected EXAM: CT ABDOMEN AND PELVIS WITHOUT CONTRAST TECHNIQUE: Multidetector CT imaging of the abdomen and pelvis was performed following the standard protocol without IV contrast. RADIATION DOSE REDUCTION: This exam was performed according to the departmental dose-optimization program which  includes automated exposure control, adjustment of the mA and/or kV according to patient size and/or use of iterative reconstruction technique. COMPARISON:  Same day abdominal radiographs FINDINGS: Lower chest: Cardiomegaly. No pericardial effusion. Left basilar atelectasis/scarring. No acute abnormality. Hepatobiliary: No focal liver abnormality is seen. No gallstones, gallbladder wall thickening, or biliary dilatation. Pancreas: Unremarkable. No pancreatic ductal dilatation or surrounding inflammatory changes. Spleen: Normal in size without focal abnormality. Adrenals/Urinary Tract: Adrenal glands are unremarkable. Bilateral renal cysts some of which were previously seen on ultrasound 08/09/2018. However there are new exophytic cysts in the left kidney. For example along the lateral mid left kidney hyperdense lesion (Hounsfield units 57) measuring 9 mm (series 2/image 20). Areas of cortical renal scarring bilaterally. No hydronephrosis or urinary calculi. Unremarkable bladder given degree of distention. Stomach/Bowel: Stomach is within normal limits. Appendix appears normal. No evidence of bowel wall thickening, distention, or inflammatory changes. Vascular/Lymphatic: No significant vascular findings are present. No enlarged abdominal or pelvic lymph nodes. Reproductive: Uterus and bilateral adnexa are unremarkable. Other: No free intraperitoneal fluid or air. Musculoskeletal: No acute osseous abnormality. Symmetric sclerosis both SI joints. IMPRESSION: 1. No acute abnormality in the abdomen or pelvis.  Specifically no urinary calculi or hydronephrosis. 2. Bilateral renal cystic lesions some of which are intermediate density and may represent proteinaceous or hemorrhagic cysts however these are incompletely evaluated without IV contrast. Further evaluation with nonemergent renal mass CT or MRI is recommended. Electronically Signed   By: Placido Sou M.D.   On: 01/05/2022 21:20   DG Abdomen 1 View  Result Date: 01/05/2022 CLINICAL DATA:  61 year old female with nausea and vomiting for 3 days. EXAM: ABDOMEN - 1 VIEW COMPARISON:  None Available. FINDINGS: The bowel gas pattern is unremarkable. No dilated bowel loops are present. Calcifications within the anatomic pelvis probably represent phleboliths. No acute bony abnormalities are noted. Chronic appearing deformity of the MEDIAL LEFT pubic bone. Mild degenerative changes in both hips noted. IMPRESSION: 1. No acute abnormality. 2. Aretta Stetzel calcifications within the anatomic pelvis likely represent phleboliths. Consider CT if there is strong clinical suspicion for obstructing urinary calculus. Electronically Signed   By: Margarette Canada M.D.   On: 01/05/2022 16:58    Procedures Procedures    Medications Ordered in ED Medications  ondansetron (ZOFRAN) injection 4 mg (4 mg Intravenous Given 01/05/22 1704)  pantoprazole (PROTONIX) injection 40 mg (40 mg Intravenous Given 01/05/22 1714)  cefTRIAXone (ROCEPHIN) 1 g in sodium chloride 0.9 % 100 mL IVPB (0 g Intravenous Stopped 01/05/22 2118)  fluconazole (DIFLUCAN) tablet 150 mg (150 mg Oral Given 01/05/22 2215)    ED Course/ Medical Decision Making/ A&P                           Medical Decision Making Amount and/or Complexity of Data Reviewed Labs: ordered. Radiology: ordered.  Risk Prescription drug management.  This patient presents to the ED for concern of nausea and vomiting x3 days   Co morbidities that complicate the patient evaluation  Diabetes mellitus II, CKD V   Lab Tests:  I  Ordered, and personally interpreted labs.  The pertinent results include:  Cr: 7.40, BUN of 76 (improved from August lab work), many bacteria w/many WBC and yeast   Imaging Studies ordered:  I ordered imaging studies including KUB and CT renal study  KUB illustrated possible phleboliths and possible stone. CT illustrated no hydronephrosis or renal stones. CT illustrated multiple cystic lesions on kidneys  Cardiac  Monitoring: / EKG:  The patient was maintained on a cardiac monitor.  I personally viewed and interpreted the cardiac monitored which showed an underlying rhythm of: sinus rhythm   Problem List / ED Course / Critical interventions / Medication management  I ordered medication including zofran, pantoprazole, fluconazole, and ceftriaxone for treatment of nausea, yeast infection, and UTI. Reevaluation of the patient after these medicines showed that the patient improved I have reviewed the patients home medicines and have made adjustments as needed   Test / Admission - Considered:  Patient is a 61 year old female, history of diabetes, CKD 5, who presents to the ED secondary to nausea.  EKG reviewed, no ST elevation or depression.  Abdominal exam positive for mild tenderness to palpation of left upper quadrant with no guarding present.  Pain likely secondary from retching.  No CVA tenderness.  UA positive for UTI, ceftriaxone administered, Keflex for outpatient.  Culture sent.  Also positive for yeast infection, fluconazole given, outpatient dose as well after antibiotic finished.  Discussed CT findings of cystic renal lesions, need for MRI, she voiced understanding and will follow up with her PCP and her nephrologist.  Patient well-appearing, was able to p.o. challenge, and nausea resolved thus admission not necessary.  Advised on return symptoms. Final diagnoses:  Acute cystitis without hematuria  Nausea and vomiting, unspecified vomiting type  Yeast infection  Kidney cysts    Rx  / DC Orders ED Discharge Orders          Ordered    fluconazole (DIFLUCAN) 150 MG tablet  Daily        01/05/22 2213    cephALEXin (KEFLEX) 250 MG capsule  2 times daily        01/05/22 2213              Shirle Provencal, [place], [place] 01/06/22 0214    Margette Fast, MD 01/06/22 2010

## 2022-01-05 NOTE — ED Triage Notes (Signed)
Vomiting for the past 3 days.  Occurs about every 2-3 after eating.  History of the same.  Denies pain.  Has not contacted provider.  Has not used any medicines at home for nausea   Did not take any medicines today.  "Sometimes I remember, sometimes I dont"  Patient referred here from [facility]

## 2022-01-05 NOTE — ED Provider Notes (Signed)
Patient here today for evaluation of nausea and vomiting. Given prior decline of kidney function recommended she be seen in ED for stat labs, etc. Patient is agreeable to same and feels safe to transport herself via POV to local ED.    Francene Finders, PA-C 01/05/22 1549

## 2022-01-06 ENCOUNTER — Telehealth: Payer: Self-pay

## 2022-01-06 NOTE — Telephone Encounter (Signed)
Pt called and asked for a MRI referral

## 2022-01-08 LAB — URINE CULTURE: Culture: >=100000 — AB

## 2022-01-08 NOTE — Telephone Encounter (Signed)
What reason did she need the MRI ordered for?

## 2022-01-08 NOTE — Telephone Encounter (Signed)
Left message with Husband Nadara Mustard for pt to call back:

## 2022-01-09 ENCOUNTER — Telehealth (HOSPITAL_BASED_OUTPATIENT_CLINIC_OR_DEPARTMENT_OTHER): Payer: Self-pay | Admitting: *Deleted

## 2022-01-09 NOTE — Telephone Encounter (Signed)
Post ED Visit - Positive Culture Follow-up  Culture report reviewed by antimicrobial stewardship pharmacist: [place] Team '[]'$  Elenor Quinones, Pharm.D. '[]'$  Heide Guile, Pharm.D., BCPS AQ-ID '[]'$  Parks Neptune, Pharm.D., BCPS '[]'$  Alycia Rossetti, Pharm.D., BCPS '[]'$  [place], Pharm.D., BCPS, AAHIVP '[]'$  Legrand Como, Pharm.D., BCPS, AAHIVP '[]'$  Salome Arnt, PharmD, BCPS '[]'$  Johnnette Gourd, PharmD, BCPS '[]'$  Hughes Better, PharmD, BCPS '[]'$  Leeroy Cha, PharmD '[]'$  Laqueta Linden, PharmD, BCPS '[]'$  Albertina Parr, PharmD  [place] Team '[]'$  Leodis Sias, PharmD '[]'$  Lindell Spar, PharmD '[]'$  Royetta Asal, PharmD '[]'$  Graylin Shiver, Rph '[]'$  Rema Fendt) Glennon Mac, PharmD '[]'$  Arlyn Dunning, PharmD '[]'$  Netta Cedars, PharmD '[]'$  Dia Sitter, PharmD '[]'$  Leone Haven, PharmD '[]'$  Gretta Arab, PharmD '[]'$  Theodis Shove, PharmD '[]'$  Peggyann Juba, PharmD '[]'$  Reuel Boom, PharmD   Positive urine culture Treated with Cephalexin, organism sensitive to the same and no further patient follow-up is required at this time. Bertis Ruddy, PharmD  Harlon Flor Talley 01/09/2022, 7:39 AM

## 2022-01-10 NOTE — Telephone Encounter (Signed)
Pt stated that her Cardiologist is  Dr. [name] with [facility]: Phone number [phone]

## 2022-01-10 NOTE — Telephone Encounter (Signed)
Left message for husband for pt to call us back:

## 2022-01-13 ENCOUNTER — Other Ambulatory Visit: Payer: Medicaid Other | Admitting: Pharmacist

## 2022-01-13 NOTE — Patient Instructions (Signed)
Odie,   It was great talking to you today!  Check your blood pressure once daily, and any time you have concerning symptoms like headache, chest pain, dizziness, shortness of breath, or vision changes.   Our goal is less than 130/80.  To appropriately check your blood pressure, make sure you do the following:  1) Avoid caffeine, exercise, or tobacco products for 30 minutes before checking. Empty your bladder. 2) Sit with your back supported in a flat-backed chair. Rest your arm on something flat (arm of the chair, table, etc). 3) Sit still with your feet flat on the floor, resting, for at least 5 minutes.  4) Check your blood pressure. Take 1-2 readings.  5) Write down these readings and bring with you to any provider appointments.  Bring your home blood pressure machine with you to a provider's office for accuracy comparison at least once a year.   Make sure you take your blood pressure medications before you come to any office visit, even if you were asked to fast for labs.   Take care!  Catie Hedwig Morton, PharmD, [facility] [phone]

## 2022-01-13 NOTE — Telephone Encounter (Signed)
Dr. [name] with [facility]: Phone number [phone] contacted and spoke to [name]: Faxed number requested to send fax in regard to  Cardiac Clearance: Fax # received [phone] Paper fax sent to Dr. [name] requesting Cardiac Clearance

## 2022-01-13 NOTE — Progress Notes (Signed)
Chief Complaint  Patient presents with   Medication Management   Diabetes   Hypertension   Hyperlipidemia    Margaret Aguilar is a 61 y.o. year old female who presented for a telephone visit.   They were referred to the pharmacist by their PCP for assistance in managing diabetes and hypertension.   Patient is participating in a Managed Medicaid Plan:  Yes  Subjective:  Care Team: Primary Care Provider: [name], NP ; Next Scheduled Visit: 02/17/22  Cardiologist: [name]; Next Scheduled Visit: later this week  Medication Access/Adherence  Current Pharmacy:  [id number] [place] [id number]: [phone]: [address] [place]: [phone]: [phone]   Patient reports affordability concerns with their medications: No  Patient reports access/transportation concerns to their pharmacy: No  Patient reports adherence concerns with their medications:  No     Diabetes:  Current medications: Ozempic 0.5 mg weekly  Did have an ED visit for nausea/vomiting, but was diagnosed with UTI and GI upset improved with treatment. Denies any other episodes of nausea/vomiting.   Reports she can feel weight loss in her clothes and people around her have commented on her weight loss. Weight at ED was ~ 8 lbs down from last PCP visit.  Hypertension:  Current medications: carvedilol 12.5 mg twice daily, isosorbide/hydralazine 20/37.5 mg twice daily, amlodipine 10 mg daily   Patient has an automated, upper arm home BP cuff Current blood pressure readings readings: SBP 140-150s recently. Has not taken her cuff in for validation   Hyperlipidemia/ASCVD Risk Reduction  Current lipid lowering medications: none  Clinical ASCVD: No  The 10-year ASCVD risk score (Arnett DK, et al., 2019) is:  19.5%   Values used to calculate the score:     Age: [age]years     Sex: Female     Is Non-Hispanic African American: Yes     Diabetic: Yes     Tobacco smoker: No     Systolic Blood Pressure: 1211mmHg     Is BP treated: Yes     HDL Cholesterol: 52 mg/dL     Total Cholesterol: 200 mg/dL    Health Maintenance  Health Maintenance Due  Topic Date Due   FOOT EXAM  Never done   PAP SMEAR-Modifier  Never done   Zoster Vaccines- Shingrix (1 of 2) Never done   Diabetic kidney evaluation - Urine ACR  01/09/2018   COVID-19 Vaccine (2 - Moderna series) 02/06/2020   OPHTHALMOLOGY EXAM  07/25/2020   INFLUENZA VACCINE  11/12/2021     Objective: Lab Results  Component Value Date   HGBA1C 5.5 11/15/2021    Lab Results  Component Value Date   CREATININE 7.40 (H) 01/05/2022   BUN 76 (H) 01/05/2022   NA 138 01/05/2022   K 3.7 01/05/2022   CL 109 01/05/2022   CO2 19 (L) 01/05/2022    Lab Results  Component Value Date   CHOL 200 (H) 11/15/2021   HDL 52 11/15/2021   LDLCALC 132 (H) 11/15/2021   TRIG 87 11/15/2021   CHOLHDL 3.8 11/15/2021    Medications Reviewed Today     Reviewed by HOsker Mason RPH-CPP (Pharmacist) on 01/13/22 at 1408  Med List Status: <None>   Medication Order Taking? Sig Documenting Provider Last Dose Status Informant  albuterol (PROVENTIL) (2.5 MG/3ML) 0.083% nebulizer solution [id number]  Take 3 mLs (2.5 mg total) by nebulization every 6 (six) hours as needed for wheezing or shortness of breath. [name], NP  Active Self  albuterol (VENTOLIN HFA) 108 (90 Base) MCG/ACT inhaler [id number] Yes Inhale 2 puffs into the lungs every 6 (six) hours as needed for wheezing or shortness of breath. [name], NP Taking Active Self  allopurinol (ZYLOPRIM) 100 MG tablet [id number] Yes Take 0.5 tablets (50 mg total) by mouth 2 (two) times a week. [name], NP Taking Active   amLODipine (NORVASC) 10 MG tablet [id number] Yes Take 1 tablet (10 mg total)  by mouth daily. [name], NP Taking Active   benzonatate (TESSALON) 100 MG capsule [id number] No Take 1-2 capsules (100-200 mg total) by mouth 3 (three) times daily as needed for cough.  Patient not taking: Reported on 01/13/2022   Jaynee Eagles, PA-C Not Taking Active   calcitRIOL (ROCALTROL) 0.25 MCG capsule [id number] Yes Take 0.25 mcg by mouth daily. [provider] Taking Active Self  carvedilol (COREG) 12.5 MG tablet [id number] Yes Take 1 tablet (12.5 mg total) by mouth 2 (two) times daily with a meal. [name], NP Taking Active   colchicine 0.6 MG tablet [id number] Yes Take 0.3 mg once, and then every 3 days until gout flare is resolved [name], NP Taking Active   docusate sodium (COLACE) 100 MG capsule [id number] Yes Take 1 capsule (100 mg total) by mouth 2 (two) times daily. [name], NP Taking Active   furosemide (LASIX) 40 MG tablet [id number] Yes Take 1 tablet (40 mg total) by mouth daily. [name], NP Taking Active   isosorbide-hydrALAZINE (BIDIL) 20-37.5 MG tablet [id number] Yes Take 1 tablet by mouth 2 (two) times daily. [name], NP Taking Active   montelukast (SINGULAIR) 10 MG tablet [id number] Yes Take 1 tablet (10 mg total) by mouth at bedtime. [name], NP Taking Active   ondansetron (ZOFRAN) 4 MG tablet [id number] No Take 1 tablet (4 mg total) by mouth every 8 (eight) hours as needed for nausea or vomiting.  Patient not taking: Reported on 11/26/2021   [name], NP Not Taking Active   promethazine-dextromethorphan (PROMETHAZINE-DM) 6.25-15 MG/5ML syrup [id number] No Take 5 mLs by mouth at bedtime as needed for cough.  Patient not taking: Reported on 12/23/2021   Jaynee Eagles, PA-C Not Taking Active   Semaglutide,0.25 or 0.'5MG'$ /DOS, (OZEMPIC, 0.25 OR 0.5 MG/DOSE,) 2 MG/3ML SOPN [id number] Yes Inject 0.25 mg weekly for 4 weeks, then increase to 0.5 mg weekly [name], NP Taking Active                Assessment/Plan:   Diabetes:  - Currently controlled and appears to be tolerating medication well. - Recommended to continue current regimen at this time. Follows up with nephrology next week  Hypertension: - Currently uncontrolled - Reviewed long term cardiovascular and renal outcomes of uncontrolled blood pressure - Reviewed appropriate blood pressure monitoring technique and reviewed goal blood pressure. Recommended to check home blood pressure and heart rate at least twice weekly and document. Recommended to take home BP machine to upcoming cardiology appointment for validation - Recommend to continue current regimen and discuss improved BP control with cardiology and/or nephrology at upcoming appointments   Hyperlipidemia/ASCVD Risk Reduction: - Currently uncontrolled.  - Recommend to discuss statin therapy with cardiologist at upcoming appointment   Patient requested sooner follow  up with PCP due to results of CT renal stone study on 01/05/22. Will communicate with PCP. She also requests refill on albuterol HFA   Follow Up Plan: phone call in ~ 6 weeks  Catie Hedwig Morton, PharmD, [facility] [phone]

## 2022-01-20 ENCOUNTER — Telehealth: Payer: Self-pay

## 2022-01-20 NOTE — Telephone Encounter (Signed)
Follow up was made to Dr. [name] with [facility]: Phone number [phone]  and spoke to [name] in regard to Cardia Clearance: [name] stated that she no showed for an appointment that they had scheduled for her and that they cannot get in touch with her now:  Unable to reach pt. Pt voice mail has not been set up yet:

## 2022-01-21 NOTE — Telephone Encounter (Signed)
January 20, 2022 Me      01/20/22  2:03 PM Note Follow up was made to Dr. [name] with [facility]: Phone number [phone]  and spoke to [name] in regard to Cardia Clearance: [name] stated that she no showed for an appointment that they had scheduled for her and that they cannot get in touch with her now:   Unable to reach pt. Pt voice mail has not been set up yet:

## 2022-01-22 ENCOUNTER — Other Ambulatory Visit: Payer: Self-pay

## 2022-01-22 ENCOUNTER — Encounter (HOSPITAL_COMMUNITY): Payer: Self-pay | Admitting: Emergency Medicine

## 2022-01-22 ENCOUNTER — Emergency Department (HOSPITAL_COMMUNITY)
Admission: EM | Admit: 2022-01-22 | Discharge: 2022-01-22 | Disposition: A | Discharge status: Home / Self Care | Payer: Medicaid Other | Attending: Emergency Medicine | Admitting: Emergency Medicine

## 2022-01-22 DIAGNOSIS — I959 Hypotension, unspecified: Secondary | ICD-10-CM | POA: Insufficient documentation

## 2022-01-22 DIAGNOSIS — E1122 Type 2 diabetes mellitus with diabetic chronic kidney disease: Secondary | ICD-10-CM | POA: Insufficient documentation

## 2022-01-22 DIAGNOSIS — Z79899 Other long term (current) drug therapy: Secondary | ICD-10-CM | POA: Diagnosis not present

## 2022-01-22 DIAGNOSIS — R61 Generalized hyperhidrosis: Secondary | ICD-10-CM | POA: Diagnosis not present

## 2022-01-22 DIAGNOSIS — R42 Dizziness and giddiness: Secondary | ICD-10-CM | POA: Diagnosis not present

## 2022-01-22 DIAGNOSIS — N184 Chronic kidney disease, stage 4 (severe): Secondary | ICD-10-CM | POA: Insufficient documentation

## 2022-01-22 DIAGNOSIS — Z91040 Latex allergy status: Secondary | ICD-10-CM | POA: Insufficient documentation

## 2022-01-22 DIAGNOSIS — I129 Hypertensive chronic kidney disease with stage 1 through stage 4 chronic kidney disease, or unspecified chronic kidney disease: Secondary | ICD-10-CM | POA: Insufficient documentation

## 2022-01-22 LAB — CBC WITH DIFFERENTIAL/PLATELET
Abs Immature Granulocytes: 0.02 10*3/uL (ref 0.00–0.07)
Basophils Absolute: 0 10*3/uL (ref 0.0–0.1)
Basophils Relative: 1 %
Eosinophils Absolute: 0.1 10*3/uL (ref 0.0–0.5)
Eosinophils Relative: 2 %
HCT: 37.1 % (ref 36.0–46.0)
Hemoglobin: 11.3 g/dL — ABNORMAL LOW (ref 12.0–15.0)
Immature Granulocytes: 0 %
Lymphocytes Relative: 26 %
Lymphs Abs: 1.4 10*3/uL (ref 0.7–4.0)
MCH: 25.7 pg — ABNORMAL LOW (ref 26.0–34.0)
MCHC: 30.5 g/dL (ref 30.0–36.0)
MCV: 84.3 fL (ref 80.0–100.0)
Monocytes Absolute: 0.4 10*3/uL (ref 0.1–1.0)
Monocytes Relative: 7 %
Neutro Abs: 3.6 10*3/uL (ref 1.7–7.7)
Neutrophils Relative %: 64 %
Platelets: 201 10*3/uL (ref 150–400)
RBC: 4.4 MIL/uL (ref 3.87–5.11)
RDW: 15.9 % — ABNORMAL HIGH (ref 11.5–15.5)
WBC: 5.6 10*3/uL (ref 4.0–10.5)
nRBC: 0 % (ref 0.0–0.2)

## 2022-01-22 LAB — BASIC METABOLIC PANEL
Anion gap: 7 (ref 5–15)
BUN: 59 mg/dL — ABNORMAL HIGH (ref 6–20)
CO2: 19 mmol/L — ABNORMAL LOW (ref 22–32)
Calcium: 8.5 mg/dL — ABNORMAL LOW (ref 8.9–10.3)
Chloride: 110 mmol/L (ref 98–111)
Creatinine, Ser: 7.7 mg/dL — ABNORMAL HIGH (ref 0.44–1.00)
GFR, Estimated: 6 mL/min — ABNORMAL LOW (ref >60)
Glucose, Bld: 126 mg/dL — ABNORMAL HIGH (ref 70–99)
Potassium: 4.3 mmol/L (ref 3.5–5.1)
Sodium: 136 mmol/L (ref 135–145)

## 2022-01-22 NOTE — ED Triage Notes (Signed)
BIBA Per EMS: Pt coming from cardiologist w/ c/o hypotension & dizziness. Pt was also diaphoretic  80/50BP upon arrival  154 CBG  20G RAC  572m bolus  Recently started ozempic & states it has been giving her issues

## 2022-01-22 NOTE — ED Provider Notes (Addendum)
[facility] DEPT Provider Note   CSN: [id number] Arrival date & time: 01/22/22  1023     History  Chief Complaint  Patient presents with   Hypotension    Margaret Aguilar is a 61 y.o. female.  Patient with history of HTN, T2DM, CKD stage IV, IDA presents with symptomatic hypotension.  She was at her cardiologist office this morning when she felt lightheaded and dizzy. Associated with n/v and diaphoretic. No syncopal episode. BP measured at the office was 80/50. She reports taking her medications this morning. She is on carvedilol, BiDil and amlodipine for HTN. Recently started on carvedilol and ozempic about 2 months ago and had a similar episode of lightheadedness then. Denies fever, chills or recent illness. She sees nephrology for her CKD and has a fistula in place, but has not started dialysis.   The history is provided by the patient.      Home Medications Prior to Admission medications   Medication Sig Start Date End Date Taking? Authorizing Provider  albuterol (PROVENTIL) (2.5 MG/3ML) 0.083% nebulizer solution Take 3 mLs (2.5 mg total) by nebulization every 6 (six) hours as needed for wheezing or shortness of breath. 12/22/19   [name], NP  albuterol (VENTOLIN HFA) 108 (90 Base) MCG/ACT inhaler Inhale 2 puffs into the lungs every 6 (six) hours as needed for wheezing or shortness of breath. 05/15/21   [name], NP  allopurinol (ZYLOPRIM) 100 MG tablet Take 0.5 tablets (50 mg total) by mouth 2 (two) times a week. 12/12/21   [name], NP  amLODipine (NORVASC) 10 MG tablet Take 1 tablet (10 mg total) by mouth daily. 11/15/21 02/13/22  [name], NP  benzonatate (TESSALON) 100 MG capsule Take 1-2 capsules (100-200 mg total) by mouth 3 (three) times daily as needed for cough. Patient not taking: Reported on 01/13/2022 07/12/21   Jaynee Eagles, PA-C  calcitRIOL (ROCALTROL) 0.25 MCG capsule Take 0.25 mcg by mouth daily.    [provider]  carvedilol (COREG) 12.5 MG tablet Take 1 tablet (12.5 mg total) by mouth 2 (two) times daily with a meal. 11/15/21   [name], NP  colchicine 0.6 MG tablet Take 0.3 mg once, and then every 3 days until gout flare is resolved 12/11/21   [name], NP  docusate sodium (COLACE) 100 MG capsule Take 1 capsule (100 mg total) by mouth 2 (two) times daily. 08/15/21   [name], NP  furosemide (LASIX) 40 MG tablet Take 1 tablet (40 mg total) by mouth daily. 11/15/21 02/13/22  [name], NP  isosorbide-hydrALAZINE (BIDIL) 20-37.5 MG tablet Take 1 tablet by mouth 2 (two) times daily. 11/15/21 02/13/22  [name], NP  montelukast (SINGULAIR) 10 MG tablet Take 1 tablet (10 mg total) by mouth at bedtime. 11/15/21 11/15/22  [name], NP  ondansetron (ZOFRAN) 4 MG tablet Take 1 tablet (4 mg total) by mouth every 8 (eight) hours as needed for nausea or vomiting. Patient not taking: Reported on 11/26/2021 08/15/21   [name], NP  promethazine-dextromethorphan (PROMETHAZINE-DM) 6.25-15 MG/5ML syrup Take 5 mLs by mouth at bedtime as needed for cough. Patient not taking: Reported on 12/23/2021 07/12/21   Jaynee Eagles, PA-C  Semaglutide,0.25 or 0.'5MG'$ /DOS, (OZEMPIC, 0.25 OR 0.5 MG/DOSE,) 2 MG/3ML SOPN Inject 0.25 mg weekly for 4 weeks, then increase to 0.5 mg weekly 11/15/21   [name], NP      Allergies  Nutritional supplements and Latex    Review of Systems   Review of Systems  Constitutional:  Positive for diaphoresis. Negative for appetite change, chills and fever.  Respiratory:  Negative for cough and shortness of breath.   Cardiovascular:  Negative for chest pain.  Gastrointestinal:  Positive for nausea and vomiting. Negative for abdominal pain and diarrhea.  Genitourinary:  Negative for difficulty urinating and dysuria.  Neurological:  Positive for dizziness and light-headedness. Negative for syncope, weakness and numbness.    Physical Exam Updated  Vital Signs BP 128/79   Pulse (!) 58   Temp (!) 97.4 F (36.3 C)   Resp 18   Ht '5\' 2"'$  (1.575 m)   Wt 99.1 kg   LMP  (LMP Unknown)   SpO2 97%   BMI 39.95 kg/m  Physical Exam Constitutional:      General: She is not in acute distress.    Appearance: She is not ill-appearing or diaphoretic.  HENT:     Head: Normocephalic and atraumatic.  Cardiovascular:     Rate and Rhythm: Regular rhythm. Bradycardia present.  Pulmonary:     Effort: Pulmonary effort is normal.     Breath sounds: Normal breath sounds.  Skin:    General: Skin is warm and dry.  Neurological:     Mental Status: She is alert.  Psychiatric:        Mood and Affect: Mood normal.        Behavior: Behavior normal.     ED Results / Procedures / Treatments   Labs (all labs ordered are listed, but only abnormal results are displayed) Labs Reviewed  BASIC METABOLIC PANEL - Abnormal; Notable for the following components:      Result Value   CO2 19 (*)    Glucose, Bld 126 (*)    BUN 59 (*)    Creatinine, Ser 7.70 (*)    Calcium 8.5 (*)    GFR, Estimated 6 (*)    All other components within normal limits  CBC WITH DIFFERENTIAL/PLATELET - Abnormal; Notable for the following components:   Hemoglobin 11.3 (*)    MCH 25.7 (*)    RDW 15.9 (*)    All other components within normal limits    EKG None  Radiology No results found.  Procedures Procedures   Medications Ordered in ED Medications - No data to display  ED Course/ Medical Decision Making/ A&P                           Medical Decision Making Amount and/or Complexity of Data Reviewed Labs: ordered.  Patient presented with symptomatic hypotension. She received 567m bolus upon arrival. BP has been stable in ED with symptoms resolving. Able to ambulate well with stable vitals and asymptomatic. CBC unremarkable. No hypoglycemia. Creatinine remains elevated. She has nephrology appointment next week. She is stable for discharge with close follow up  with PCP. Discussed with patient to follow up with her PCP regarding her medications. Return precautions given.   Final Clinical Impression(s) / ED Diagnoses Final diagnoses:  Hypotension, unspecified hypotension type    Rx / DC Orders ED Discharge Orders     None         ZAngelique Blonder DO 01/22/22 1341    ZAngelique Blonder DO 01/22/22 1346    LCarmin Muskrat MD 01/22/22 1659

## 2022-01-22 NOTE — ED Notes (Signed)
Pt ambulated approximately 50 feet, well tolerated with stable HR and sats above 99%

## 2022-01-22 NOTE — Discharge Instructions (Addendum)
You were seen for low blood pressure. Your symptoms have improved/resolved. Please go to your scheduled appointment with nephrology for next week. Also, discuss with your primary care doctor regarding your blood pressure medications. If your symptoms return or worsen, please return to the emergency room.

## 2022-01-27 ENCOUNTER — Ambulatory Visit: Payer: Medicaid Other | Admitting: Nurse Practitioner

## 2022-01-27 ENCOUNTER — Encounter: Payer: Self-pay | Admitting: Nurse Practitioner

## 2022-01-27 ENCOUNTER — Ambulatory Visit (INDEPENDENT_AMBULATORY_CARE_PROVIDER_SITE_OTHER): Payer: Medicaid Other | Admitting: Clinical

## 2022-01-27 VITALS — BP 135/93 | HR 58 | Temp 97.7°F | Wt 219.0 lb

## 2022-01-27 DIAGNOSIS — I499 Cardiac arrhythmia, unspecified: Secondary | ICD-10-CM

## 2022-01-27 DIAGNOSIS — D235 Other benign neoplasm of skin of trunk: Secondary | ICD-10-CM | POA: Diagnosis not present

## 2022-01-27 DIAGNOSIS — F3289 Other specified depressive episodes: Secondary | ICD-10-CM | POA: Diagnosis not present

## 2022-01-27 MED ORDER — ALBUTEROL SULFATE HFA 108 (90 BASE) MCG/ACT IN AERS: 2.0000 | INHALATION_SPRAY | Freq: Four times a day (QID) | RESPIRATORY_TRACT | 0 refills | Status: DC | PRN

## 2022-01-27 NOTE — Patient Instructions (Addendum)
1. Dermoid cyst of skin of back  - Ambulatory referral to Dermatology  2. Irregular heart rhythm  - EKG 12-Lead  Follow up:  Follow up in 6 months

## 2022-01-27 NOTE — Progress Notes (Signed)
_0  ID: Margaret Aguilar, female    DOB: 1960/08/16, 61 y.o.   MRN: [id number]  Chief Complaint  Patient presents with   Follow-up    Was seen in ER had CT that was abnormal, asking for Mri to further evaluate. She also states there is a knot on her back that she needs to have drained.     Referring provider: [name], NP   HPI  Margaret Aguilar presents for follow up. She has been lost to follow up. She  has a past medical history of Anemia, Anxiety, Arthritis, Asthma, Back pain, Bradycardia, Depression, Diabetes ([place]), Diabetes mellitus without complication ([place]), Drug use, GERD (gastroesophageal reflux disease), Gout, Herpes, High cholesterol, HLD (hyperlipidemia), Hypertension, Joint pain, Kidney disease, Kidney disease, chronic, stage IV (severe, EGFR 15-29 ml/min) (HCC), Palpitations, Pneumonia, Sleep apnea, SOB (shortness of breath), and Vitamin D-dependent rickets.   Patient presents today for a follow-up visit.  Patient was recently seen in the ED for UTI.  Patient was treated with antibiotics.  Patient did have a CT scan which was abnormal and showed multiple kidney lesions.  Patient does have a follow-up scheduled with nephrology tomorrow.  She is concerned because she needs a follow-up MRI for further evaluation.  We discussed that she will need to discuss this with her nephrologist.  Patient also comes in today of a raised painful area to her mid back.  She states this place has been here for about 6 months.  We discussed that we will refer her to dermatology for this.  Note: Patient's heart rhythm was noted to be irregular upon exam.  EKG did show multiple PVCs.  Patient does have a follow-up scheduled with cardiology next week.  Patient is not currently symptomatic.  Denies f/c/s, n/v/d, hemoptysis, PND, leg swelling Denies chest pain or edema       Allergies  Allergen Reactions   Nutritional Supplements     Walnut >> UNSPECIFIED REACTION   Severity from [place]   Latex Swelling and Rash    SWELLING REACTION UNSPECIFIED     Immunization History  Administered Date(s) Administered   Influenza,inj,Quad PF,6+ Mos 01/12/2018   Moderna Sars-Covid-2 Vaccination 12/12/2019   PPD Test 09/28/2013, 10/12/2013   Tdap 12/29/2019    Past Medical History:  Diagnosis Date   Anemia    Anxiety    Arthritis    Asthma    Back pain    Bradycardia    Depression    Diabetes ([place])    Diabetes mellitus without complication ([place])    x 5 yrs   Drug use    GERD (gastroesophageal reflux disease)    Gout    Herpes    High cholesterol    HLD (hyperlipidemia)    Hypertension    Joint pain    Kidney disease    Stage 4   Kidney disease, chronic, stage IV (severe, EGFR 15-29 ml/min) (HCC)    Palpitations    Pneumonia    several   Sleep apnea    hasn't gotten cpap yet   SOB (shortness of breath)    Vitamin D-dependent rickets     Tobacco History: Social History   Tobacco Use  Smoking Status Never  Smokeless Tobacco Never   Counseling given: Not Answered   Outpatient Encounter Medications as of 01/27/2022  Medication Sig   albuterol (PROVENTIL) (2.5 MG/3ML) 0.083% nebulizer solution Take 3 mLs (2.5 mg total) by nebulization every 6 (six) hours as needed for wheezing or  shortness of breath.   allopurinol (ZYLOPRIM) 100 MG tablet Take 0.5 tablets (50 mg total) by mouth 2 (two) times a week.   amLODipine (NORVASC) 10 MG tablet Take 1 tablet (10 mg total) by mouth daily.   calcitRIOL (ROCALTROL) 0.25 MCG capsule Take 0.25 mcg by mouth daily.   carvedilol (COREG) 12.5 MG tablet Take 1 tablet (12.5 mg total) by mouth 2 (two) times daily with a meal.   colchicine 0.6 MG tablet Take 0.3 mg once, and then every 3 days until gout flare is resolved   furosemide (LASIX) 40 MG tablet Take 1 tablet (40 mg total) by mouth daily.   isosorbide-hydrALAZINE (BIDIL) 20-37.5 MG tablet Take 1 tablet by mouth 2 (two) times daily.   montelukast  (SINGULAIR) 10 MG tablet Take 1 tablet (10 mg total) by mouth at bedtime.   Semaglutide,0.25 or 0.5MG/DOS, (OZEMPIC, 0.25 OR 0.5 MG/DOSE,) 2 MG/3ML SOPN Inject 0.25 mg weekly for 4 weeks, then increase to 0.5 mg weekly   [DISCONTINUED] albuterol (VENTOLIN HFA) 108 (90 Base) MCG/ACT inhaler Inhale 2 puffs into the lungs every 6 (six) hours as needed for wheezing or shortness of breath.   albuterol (VENTOLIN HFA) 108 (90 Base) MCG/ACT inhaler Inhale 2 puffs into the lungs every 6 (six) hours as needed for wheezing or shortness of breath.   docusate sodium (COLACE) 100 MG capsule Take 1 capsule (100 mg total) by mouth 2 (two) times daily. (Patient not taking: Reported on 01/27/2022)   ondansetron (ZOFRAN) 4 MG tablet Take 1 tablet (4 mg total) by mouth every 8 (eight) hours as needed for nausea or vomiting. (Patient not taking: Reported on 11/26/2021)   promethazine-dextromethorphan (PROMETHAZINE-DM) 6.25-15 MG/5ML syrup Take 5 mLs by mouth at bedtime as needed for cough. (Patient not taking: Reported on 12/23/2021)   [DISCONTINUED] benzonatate (TESSALON) 100 MG capsule Take 1-2 capsules (100-200 mg total) by mouth 3 (three) times daily as needed for cough. (Patient not taking: Reported on 01/13/2022)   No facility-administered encounter medications on file as of 01/27/2022.     Review of Systems  Review of Systems  Constitutional: Negative.   HENT: Negative.    Cardiovascular: Negative.   Gastrointestinal: Negative.   Allergic/Immunologic: Negative.   Neurological: Negative.   Psychiatric/Behavioral: Negative.         Physical Exam  BP (!) 135/93   Pulse (!) 58   Temp 97.7 F (36.5 C) (Oral)   Wt 219 lb (99.3 kg)   LMP  (LMP Unknown)   SpO2 100%   BMI 40.06 kg/m   Wt Readings from Last 5 Encounters:  01/27/22 219 lb (99.3 kg)  01/22/22 218 lb 6.4 oz (99.1 kg)  01/05/22 222 lb (100.7 kg)  12/23/21 222 lb (100.7 kg)  11/18/21 230 lb (104.3 kg)     Physical Exam Vitals and  nursing note reviewed.  Constitutional:      General: She is not in acute distress.    Appearance: She is well-developed.  Cardiovascular:     Rate and Rhythm: Normal rate. Rhythm irregular.  Pulmonary:     Effort: Pulmonary effort is normal.     Breath sounds: Normal breath sounds.  Skin:         Comments: Small tender raised area to back that appears to be a cyst.   Neurological:     Mental Status: She is alert and oriented to person, place, and time.      Lab Results:  CBC    Component Value Date/Time  WBC 5.6 01/22/2022 1100   RBC 4.40 01/22/2022 1100   HGB 11.3 (L) 01/22/2022 1100   HGB 11.9 11/15/2021 0921   HCT 37.1 01/22/2022 1100   HCT 36.3 11/15/2021 0921   PLT 201 01/22/2022 1100   PLT 246 11/15/2021 0921   MCV 84.3 01/22/2022 1100   MCV 80 11/15/2021 0921   MCH 25.7 (L) 01/22/2022 1100   MCHC 30.5 01/22/2022 1100   RDW 15.9 (H) 01/22/2022 1100   RDW 16.4 (H) 11/15/2021 0921   LYMPHSABS 1.4 01/22/2022 1100   LYMPHSABS 2.3 01/12/2018 1237   MONOABS 0.4 01/22/2022 1100   EOSABS 0.1 01/22/2022 1100   EOSABS 0.2 01/12/2018 1237   BASOSABS 0.0 01/22/2022 1100   BASOSABS 0.0 01/12/2018 1237    BMET    Component Value Date/Time   NA 136 01/22/2022 1100   NA 143 11/15/2021 0921   K 4.3 01/22/2022 1100   CL 110 01/22/2022 1100   CO2 19 (L) 01/22/2022 1100   GLUCOSE 126 (H) 01/22/2022 1100   BUN 59 (H) 01/22/2022 1100   BUN 67 (H) 11/15/2021 0921   CREATININE 7.70 (H) 01/22/2022 1100   CALCIUM 8.5 (L) 01/22/2022 1100   GFRNONAA 6 (L) 01/22/2022 1100   GFRAA 17 (L) 08/16/2018 1307    BNP No results found for: "BNP"  ProBNP No results found for: "PROBNP"  Imaging: CT Renal Stone Study  Result Date: 01/05/2022 CLINICAL DATA:  Flank pain; kidney stones suspected EXAM: CT ABDOMEN AND PELVIS WITHOUT CONTRAST TECHNIQUE: Multidetector CT imaging of the abdomen and pelvis was performed following the standard protocol without IV contrast. RADIATION  DOSE REDUCTION: This exam was performed according to the departmental dose-optimization program which includes automated exposure control, adjustment of the mA and/or kV according to patient size and/or use of iterative reconstruction technique. COMPARISON:  Same day abdominal radiographs FINDINGS: Lower chest: Cardiomegaly. No pericardial effusion. Left basilar atelectasis/scarring. No acute abnormality. Hepatobiliary: No focal liver abnormality is seen. No gallstones, gallbladder wall thickening, or biliary dilatation. Pancreas: Unremarkable. No pancreatic ductal dilatation or surrounding inflammatory changes. Spleen: Normal in size without focal abnormality. Adrenals/Urinary Tract: Adrenal glands are unremarkable. Bilateral renal cysts some of which were previously seen on ultrasound 08/09/2018. However there are new exophytic cysts in the left kidney. For example along the lateral mid left kidney hyperdense lesion (Hounsfield units 57) measuring 9 mm (series 2/image 20). Areas of cortical renal scarring bilaterally. No hydronephrosis or urinary calculi. Unremarkable bladder given degree of distention. Stomach/Bowel: Stomach is within normal limits. Appendix appears normal. No evidence of bowel wall thickening, distention, or inflammatory changes. Vascular/Lymphatic: No significant vascular findings are present. No enlarged abdominal or pelvic lymph nodes. Reproductive: Uterus and bilateral adnexa are unremarkable. Other: No free intraperitoneal fluid or air. Musculoskeletal: No acute osseous abnormality. Symmetric sclerosis both SI joints. IMPRESSION: 1. No acute abnormality in the abdomen or pelvis. Specifically no urinary calculi or hydronephrosis. 2. Bilateral renal cystic lesions some of which are intermediate density and may represent proteinaceous or hemorrhagic cysts however these are incompletely evaluated without IV contrast. Further evaluation with nonemergent renal mass CT or MRI is recommended.  Electronically Signed   By: Placido Sou M.D.   On: 01/05/2022 21:20   DG Abdomen 1 View  Result Date: 01/05/2022 CLINICAL DATA:  61 year old female with nausea and vomiting for 3 days. EXAM: ABDOMEN - 1 VIEW COMPARISON:  None Available. FINDINGS: The bowel gas pattern is unremarkable. No dilated bowel loops are present. Calcifications within the anatomic  pelvis probably represent phleboliths. No acute bony abnormalities are noted. Chronic appearing deformity of the MEDIAL LEFT pubic bone. Mild degenerative changes in both hips noted. IMPRESSION: 1. No acute abnormality. 2. Small calcifications within the anatomic pelvis likely represent phleboliths. Consider CT if there is strong clinical suspicion for obstructing urinary calculus. Electronically Signed   By: Margarette Canada M.D.   On: 01/05/2022 16:58     Assessment & Plan:   Dermoid cyst of skin of back - Ambulatory referral to Dermatology  2. Irregular heart rhythm  - EKG 12-Lead  Follow up:  Follow up in 6 months     [name], NP 01/27/2022

## 2022-01-27 NOTE — BH Specialist Note (Addendum)
Integrated Behavioral Health Follow Up In-Person Visit  MRN: [id number] Name: Margaret Aguilar [facility]  Number of [facility] Clinician visits: 3- Third Visit  Session Start time: 0910   Session End time: 1010  Total time in minutes: 60   Types of Service: Individual psychotherapy  Interpretor:No. Interpretor Name and Language: none  Subjective: Margaret Aguilar is a 61 y.o. female accompanied by  self. Patient was referred by Lazaro Arms, NP for depression related to health conditions. Patient reports the following symptoms/concerns: depression, history of trauma Duration of problem: several years; Severity of problem: severe  Objective: Mood: Euthymic and Affect: Appropriate Risk of harm to self or others: No plan to harm self or others  Patient and/or Family's Strengths/Protective Factors: Social connections, Social and Emotional competence, Concrete supports in place (healthy food, safe environments, etc.), and Sense of purpose  Goals Addressed: Patient will:  Reduce symptoms of: anxiety and depression Increase knowledge and/or ability of: coping skills and self-management skills  Demonstrate ability to: Increase healthy adjustment to current life circumstances  Progress towards Goals: Ongoing  Interventions: Interventions utilized:  Supportive Counseling Standardized Assessments completed: Not Needed  Supportive counseling today to process patient's thoughts and emotions around continued challenging dynamics with her spouse. Discussed patient's history of trauma in intimate relationships. Empowered patient to use assertive communication when communicating needs.   Patient and/or Family Response: Patient engaged in session.   Assessment: Patient currently experiencing depression related to health conditions, relationship dynamics, and history of trauma.   Patient may benefit from CBT to explore thoughts and emotions and development of coping  skills for living with chronic illnesses.   Plan: Follow up with behavioral health clinician on: 02/10/22  Estanislado Emms, LCSW

## 2022-01-27 NOTE — Assessment & Plan Note (Signed)
-   Ambulatory referral to Dermatology  2. Irregular heart rhythm  - EKG 12-Lead  Follow up:  Follow up in 6 months

## 2022-01-28 DIAGNOSIS — I120 Hypertensive chronic kidney disease with stage 5 chronic kidney disease or end stage renal disease: Secondary | ICD-10-CM | POA: Diagnosis not present

## 2022-01-28 DIAGNOSIS — Z6841 Body Mass Index (BMI) 40.0 and over, adult: Secondary | ICD-10-CM | POA: Diagnosis not present

## 2022-01-28 DIAGNOSIS — I770 Arteriovenous fistula, acquired: Secondary | ICD-10-CM | POA: Diagnosis not present

## 2022-01-28 DIAGNOSIS — N185 Chronic kidney disease, stage 5: Secondary | ICD-10-CM | POA: Diagnosis not present

## 2022-01-28 DIAGNOSIS — E1129 Type 2 diabetes mellitus with other diabetic kidney complication: Secondary | ICD-10-CM | POA: Diagnosis not present

## 2022-01-28 DIAGNOSIS — D631 Anemia in chronic kidney disease: Secondary | ICD-10-CM | POA: Diagnosis not present

## 2022-01-28 DIAGNOSIS — N2581 Secondary hyperparathyroidism of renal origin: Secondary | ICD-10-CM | POA: Diagnosis not present

## 2022-01-28 NOTE — Telephone Encounter (Signed)
Follow up was made on Cardiac Clearance:  Pt was contacted and stated that she has an office visit tomorrow at Dr. [name] office: Pt notified that we will be on the lookout for that Cardiac Clearance:  Pt verbalized understanding with all questions answered.

## 2022-01-30 NOTE — Telephone Encounter (Signed)
Follow up was made with Dr.  [name] office in regard to cardiac clearance for pt. Spoke with [name]: [name] stated that pt came for an office visit but ended up passing out due to Low Blood Sugar and EMS was called and taking to hospital:  [name] stated that pt is  to call and reschedule an office visit with Dr.  [name]:

## 2022-02-05 ENCOUNTER — Telehealth: Payer: Self-pay

## 2022-02-05 ENCOUNTER — Other Ambulatory Visit: Payer: Self-pay

## 2022-02-05 DIAGNOSIS — K219 Gastro-esophageal reflux disease without esophagitis: Secondary | ICD-10-CM

## 2022-02-05 DIAGNOSIS — Z86010 Personal history of colonic polyps: Secondary | ICD-10-CM

## 2022-02-05 NOTE — Progress Notes (Unsigned)
Patient can do ECL on December 4th but we have not rec'd cardiac clearance yet. Alycia Rossetti, RN for [facility] is working on clearance.  See Cardiac clearance notes from 9-12.

## 2022-02-05 NOTE — Telephone Encounter (Signed)
See MyChart response from patient.  Ok to have procedure on 12-4

## 2022-02-05 NOTE — Telephone Encounter (Signed)
Called patient but she did not pick up and VM was not set up.  Sending message via MyChart asking if she would be available on Dec. 4 to have her ECL.

## 2022-02-05 NOTE — Telephone Encounter (Signed)
Followed up with pt about Cardiac Clearance and the need for the office visit: Pt stated that she was going to call Dr.  [name]  office today and schedule an office visit: Pt stated that her Blood Pressure dropped at last office visit and confirmed that it was not her Blood Sugar: Pt verbalized understanding with all questions answered.

## 2022-02-10 ENCOUNTER — Ambulatory Visit: Payer: Medicaid Other | Admitting: Clinical

## 2022-02-12 DIAGNOSIS — N185 Chronic kidney disease, stage 5: Secondary | ICD-10-CM | POA: Diagnosis not present

## 2022-02-12 DIAGNOSIS — T82898A Other specified complication of vascular prosthetic devices, implants and grafts, initial encounter: Secondary | ICD-10-CM | POA: Diagnosis not present

## 2022-02-17 ENCOUNTER — Ambulatory Visit: Payer: Self-pay | Admitting: Nurse Practitioner

## 2022-02-17 ENCOUNTER — Other Ambulatory Visit: Payer: Self-pay | Admitting: *Deleted

## 2022-02-17 DIAGNOSIS — N184 Chronic kidney disease, stage 4 (severe): Secondary | ICD-10-CM

## 2022-02-18 ENCOUNTER — Other Ambulatory Visit (HOSPITAL_COMMUNITY): Payer: Self-pay | Admitting: Family Medicine

## 2022-02-18 ENCOUNTER — Ambulatory Visit: Payer: Medicaid Other | Admitting: Physician Assistant

## 2022-02-18 ENCOUNTER — Ambulatory Visit (HOSPITAL_COMMUNITY)
Admission: RE | Admit: 2022-02-18 | Discharge: 2022-02-18 | Disposition: A | Discharge status: Home / Self Care | Payer: Medicaid Other | Source: Ambulatory Visit | Attending: Vascular Surgery | Admitting: Vascular Surgery

## 2022-02-18 ENCOUNTER — Other Ambulatory Visit: Payer: Self-pay

## 2022-02-18 VITALS — BP 131/82 | HR 55 | Temp 97.5°F | Resp 20 | Ht 62.0 in | Wt 219.0 lb

## 2022-02-18 DIAGNOSIS — N186 End stage renal disease: Secondary | ICD-10-CM

## 2022-02-18 DIAGNOSIS — N184 Chronic kidney disease, stage 4 (severe): Secondary | ICD-10-CM

## 2022-02-18 NOTE — H&P (View-Only) (Signed)
Established Dialysis Access   History of Present Illness   Margaret Aguilar is a 61 y.o. (Feb 16, 1961) female who presents for re-evaluation of fistula.  She underwent left brachiocephalic AV fistula creation on 10/19/2018 by Dr. Donzetta Matters.  Postop, the fistula was still too deep and torturous for use.  She then required revision and transposition of the fistula on 06/30/2019 by Dr. Donzetta Matters.  Her fistula was then much closer to the surface and would be ready for access when the patient would need dialysis.  She currently does not require dialysis.  She states that her nephrologist has quoted her about a 14-monthtimeframe before she probably will need to start.  They have referred her here for reevaluation of the fistula.  At one of her recent encounters with her nephrologist, they noted that her fistula had low flow through it and a poorly auscultated bruit throughout.  At today's visit, she denies any pain anywhere in her left arm.  She denies any steal symptoms.   Current Outpatient Medications  Medication Sig Dispense Refill   albuterol (PROVENTIL) (2.5 MG/3ML) 0.083% nebulizer solution Take 3 mLs (2.5 mg total) by nebulization every 6 (six) hours as needed for wheezing or shortness of breath. 150 mL 11   albuterol (VENTOLIN HFA) 108 (90 Base) MCG/ACT inhaler Inhale 2 puffs into the lungs every 6 (six) hours as needed for wheezing or shortness of breath. 8 g 0   allopurinol (ZYLOPRIM) 100 MG tablet Take 0.5 tablets (50 mg total) by mouth 2 (two) times a week. 15 tablet 2   calcitRIOL (ROCALTROL) 0.25 MCG capsule Take 0.25 mcg by mouth daily.     carvedilol (COREG) 12.5 MG tablet Take 1 tablet (12.5 mg total) by mouth 2 (two) times daily with a meal. 60 tablet 3   colchicine 0.6 MG tablet Take 0.3 mg once, and then every 3 days until gout flare is resolved 30 tablet 1   docusate sodium (COLACE) 100 MG capsule Take 1 capsule (100 mg total) by mouth 2 (two) times daily. 10 capsule 0   montelukast  (SINGULAIR) 10 MG tablet Take 1 tablet (10 mg total) by mouth at bedtime. 90 tablet 3   ondansetron (ZOFRAN) 4 MG tablet Take 1 tablet (4 mg total) by mouth every 8 (eight) hours as needed for nausea or vomiting. 20 tablet 0   promethazine-dextromethorphan (PROMETHAZINE-DM) 6.25-15 MG/5ML syrup Take 5 mLs by mouth at bedtime as needed for cough. 100 mL 0   Semaglutide,0.25 or 0.'5MG'$ /DOS, (OZEMPIC, 0.25 OR 0.5 MG/DOSE,) 2 MG/3ML SOPN Inject 0.25 mg weekly for 4 weeks, then increase to 0.5 mg weekly 3 mL 2   amLODipine (NORVASC) 10 MG tablet Take 1 tablet (10 mg total) by mouth daily. 30 tablet 2   furosemide (LASIX) 40 MG tablet Take 1 tablet (40 mg total) by mouth daily. 30 tablet 2   No current facility-administered medications for this visit.    REVIEW OF SYSTEMS (negative unless checked):   Cardiac:  '[]'$  Chest pain or chest pressure? '[]'$  Shortness of breath upon activity? '[]'$  Shortness of breath when lying flat? '[]'$  Irregular heart rhythm?  Vascular:  '[]'$  Pain in calf, thigh, or hip brought on by walking? '[]'$  Pain in feet at night that wakes you up from your sleep? '[]'$  Blood clot in your veins? '[]'$  Leg swelling?  Pulmonary:  '[]'$  Oxygen at home? '[]'$  Productive cough? '[]'$  Wheezing?  Neurologic:  '[]'$  Sudden weakness in arms or legs? '[]'$  Sudden numbness in arms  or legs? '[]'$  Sudden onset of difficult speaking or slurred speech? '[]'$  Temporary loss of vision in one eye? '[]'$  Problems with dizziness?  Gastrointestinal:  '[]'$  Blood in stool? '[]'$  Vomited blood?  Genitourinary:  '[]'$  Burning when urinating? '[]'$  Blood in urine?  Psychiatric:  '[]'$  Major depression  Hematologic:  '[]'$  Bleeding problems? '[]'$  Problems with blood clotting?  Dermatologic:  '[]'$  Rashes or ulcers?  Constitutional:  '[]'$  Fever or chills?  Ear/Nose/Throat:  '[]'$  Change in hearing? '[]'$  Nose bleeds? '[]'$  Sore throat?  Musculoskeletal:  '[]'$  Back pain? '[]'$  Joint pain? '[]'$  Muscle pain?   Physical Examination   Vitals:    02/18/22 1109  BP: 131/82  Pulse: (!) 55  Resp: 20  Temp: (!) 97.5 F (36.4 C)  TempSrc: Temporal  SpO2: 100%  Weight: 219 lb (99.3 kg)  Height: '5\' 2"'$  (1.575 m)   Body mass index is 40.06 kg/m.  General:  WDWN in NAD; vital signs documented above Gait: Not observed HENT: WNL, normocephalic Pulmonary: normal non-labored breathing , without Rales, rhonchi,  wheezing Cardiac: regular HR, without murmurs without carotid bruit Abdomen: soft, NT, no masses Skin: without rashes Vascular Exam/Pulses: Palpable left radial and brachial pulse Extremities: Left brachiocephalic fistula with mild pulsatility and depressed bruit on auscultation Musculoskeletal: no muscle wasting or atrophy  Neurologic: A&O X 3;  No focal weakness or paresthesias are detected Psychiatric:  The pt has Normal affect.   Non-invasive Vascular Imaging   left Arm Access Duplex  (02/18/2022):   Patent arteriovenous fistula. Arteriovenous fistula-Velocities less than 100cm/s noted.    Medical Decision Making   [name] is a 61 y.o. female who presents with CKD4 with previously created left brachiocephalic AVF in 6295 by Dr.Cain. Revision by Dr.Cain in 2021  The patient still does not require dialysis.  She may need to start in the next 6 months, as told by her nephrologist On exam today the left brachiocephalic fistula has mild pulsatility with depressed bruit on auscultation throughout She has palpable left radial and brachial pulses Brachiocephalic duplex study today reveals a patent fistula with reduced velocities less than 100, with no obvious signs of stenosis.  Inflow is about 1000 The patient is agreeable to AV fistulogram with the next couple of weeks with possible intervention.  Since the patient is not on dialysis yet we will attempt to perform the fistulogram with CO2.  The patient understands if we do need to use contrast, this may push her closer to needing dialysis soon She will be  scheduled for left AV fistulogram with Dr.Cain in the next coming weeks.   Vicente Serene PA-C [facility] Office: [facility] MD: [facility]

## 2022-02-18 NOTE — Progress Notes (Signed)
Established Dialysis Access   History of Present Illness   Margaret Aguilar is a 61 y.o. ([birth year]) female who presents for re-evaluation of fistula.  She underwent left brachiocephalic AV fistula creation on 10/19/2018 by Dr. Donzetta Matters.  Postop, the fistula was still too deep and torturous for use.  She then required revision and transposition of the fistula on 06/30/2019 by Dr. Donzetta Matters.  Her fistula was then much closer to the surface and would be ready for access when the patient would need dialysis.  She currently does not require dialysis.  She states that her nephrologist has quoted her about a 77-monthtimeframe before she probably will need to start.  They have referred her here for reevaluation of the fistula.  At one of her recent encounters with her nephrologist, they noted that her fistula had low flow through it and a poorly auscultated bruit throughout.  At today's visit, she denies any pain anywhere in her left arm.  She denies any steal symptoms.   Current Outpatient Medications  Medication Sig Dispense Refill   albuterol (PROVENTIL) (2.5 MG/3ML) 0.083% nebulizer solution Take 3 mLs (2.5 mg total) by nebulization every 6 (six) hours as needed for wheezing or shortness of breath. 150 mL 11   albuterol (VENTOLIN HFA) 108 (90 Base) MCG/ACT inhaler Inhale 2 puffs into the lungs every 6 (six) hours as needed for wheezing or shortness of breath. 8 g 0   allopurinol (ZYLOPRIM) 100 MG tablet Take 0.5 tablets (50 mg total) by mouth 2 (two) times a week. 15 tablet 2   calcitRIOL (ROCALTROL) 0.25 MCG capsule Take 0.25 mcg by mouth daily.     carvedilol (COREG) 12.5 MG tablet Take 1 tablet (12.5 mg total) by mouth 2 (two) times daily with a meal. 60 tablet 3   colchicine 0.6 MG tablet Take 0.3 mg once, and then every 3 days until gout flare is resolved 30 tablet 1   docusate sodium (COLACE) 100 MG capsule Take 1 capsule (100 mg total) by mouth 2 (two) times daily. 10 capsule 0   montelukast  (SINGULAIR) 10 MG tablet Take 1 tablet (10 mg total) by mouth at bedtime. 90 tablet 3   ondansetron (ZOFRAN) 4 MG tablet Take 1 tablet (4 mg total) by mouth every 8 (eight) hours as needed for nausea or vomiting. 20 tablet 0   promethazine-dextromethorphan (PROMETHAZINE-DM) 6.25-15 MG/5ML syrup Take 5 mLs by mouth at bedtime as needed for cough. 100 mL 0   Semaglutide,0.25 or 0.'5MG'$ /DOS, (OZEMPIC, 0.25 OR 0.5 MG/DOSE,) 2 MG/3ML SOPN Inject 0.25 mg weekly for 4 weeks, then increase to 0.5 mg weekly 3 mL 2   amLODipine (NORVASC) 10 MG tablet Take 1 tablet (10 mg total) by mouth daily. 30 tablet 2   furosemide (LASIX) 40 MG tablet Take 1 tablet (40 mg total) by mouth daily. 30 tablet 2   No current facility-administered medications for this visit.    REVIEW OF SYSTEMS (negative unless checked):   Cardiac:  '[]'$  Chest pain or chest pressure? '[]'$  Shortness of breath upon activity? '[]'$  Shortness of breath when lying flat? '[]'$  Irregular heart rhythm?  Vascular:  '[]'$  Pain in calf, thigh, or hip brought on by walking? '[]'$  Pain in feet at night that wakes you up from your sleep? '[]'$  Blood clot in your veins? '[]'$  Leg swelling?  Pulmonary:  '[]'$  Oxygen at home? '[]'$  Productive cough? '[]'$  Wheezing?  Neurologic:  '[]'$  Sudden weakness in arms or legs? '[]'$  Sudden numbness in arms  or legs? '[]'$  Sudden onset of difficult speaking or slurred speech? '[]'$  Temporary loss of vision in one eye? '[]'$  Problems with dizziness?  Gastrointestinal:  '[]'$  Blood in stool? '[]'$  Vomited blood?  Genitourinary:  '[]'$  Burning when urinating? '[]'$  Blood in urine?  Psychiatric:  '[]'$  Major depression  Hematologic:  '[]'$  Bleeding problems? '[]'$  Problems with blood clotting?  Dermatologic:  '[]'$  Rashes or ulcers?  Constitutional:  '[]'$  Fever or chills?  Ear/Nose/Throat:  '[]'$  Change in hearing? '[]'$  Nose bleeds? '[]'$  Sore throat?  Musculoskeletal:  '[]'$  Back pain? '[]'$  Joint pain? '[]'$  Muscle pain?   Physical Examination   Vitals:    02/18/22 1109  BP: 131/82  Pulse: (!) 55  Resp: 20  Temp: (!) 97.5 F (36.4 C)  TempSrc: Temporal  SpO2: 100%  Weight: 219 lb (99.3 kg)  Height: '5\' 2"'$  (1.575 m)   Body mass index is 40.06 kg/m.  General:  WDWN in NAD; vital signs documented above Gait: Not observed HENT: WNL, normocephalic Pulmonary: normal non-labored breathing , without Rales, rhonchi,  wheezing Cardiac: regular HR, without murmurs without carotid bruit Abdomen: soft, NT, no masses Skin: without rashes Vascular Exam/Pulses: Palpable left radial and brachial pulse Extremities: Left brachiocephalic fistula with mild pulsatility and depressed bruit on auscultation Musculoskeletal: no muscle wasting or atrophy  Neurologic: A&O X 3;  No focal weakness or paresthesias are detected Psychiatric:  The pt has Normal affect.   Non-invasive Vascular Imaging   left Arm Access Duplex  (02/18/2022):   Patent arteriovenous fistula. Arteriovenous fistula-Velocities less than 100cm/s noted.    Medical Decision Making   [name] is a 61 y.o. female who presents with CKD4 with previously created left brachiocephalic AVF in 4239 by Dr.Cain. Revision by Dr.Cain in 2021  The patient still does not require dialysis.  She may need to start in the next 6 months, as told by her nephrologist On exam today the left brachiocephalic fistula has mild pulsatility with depressed bruit on auscultation throughout She has palpable left radial and brachial pulses Brachiocephalic duplex study today reveals a patent fistula with reduced velocities less than 100, with no obvious signs of stenosis.  Inflow is about 1000 The patient is agreeable to AV fistulogram with the next couple of weeks with possible intervention.  Since the patient is not on dialysis yet we will attempt to perform the fistulogram with CO2.  The patient understands if we do need to use contrast, this may push her closer to needing dialysis soon She will be  scheduled for left AV fistulogram with Dr.Cain in the next coming weeks.   Vicente Serene PA-C [facility] Office: [facility] MD: [facility]

## 2022-02-21 NOTE — Telephone Encounter (Signed)
Unable to reach pt: Phone goes directly to voice mail and states that it has not been set up yet and cannot leave a message

## 2022-02-24 ENCOUNTER — Ambulatory Visit (HOSPITAL_COMMUNITY)
Admission: RE | Admit: 2022-02-24 | Discharge: 2022-02-24 | Disposition: A | Discharge status: Home / Self Care | Payer: Medicaid Other | Attending: Vascular Surgery | Admitting: Vascular Surgery

## 2022-02-24 ENCOUNTER — Encounter (HOSPITAL_COMMUNITY): Payer: Self-pay | Admitting: Vascular Surgery

## 2022-02-24 ENCOUNTER — Encounter (HOSPITAL_COMMUNITY)
Admission: RE | Disposition: A | Discharge status: Home / Self Care | Payer: Self-pay | Source: Home / Self Care | Attending: Vascular Surgery

## 2022-02-24 ENCOUNTER — Other Ambulatory Visit: Payer: Self-pay

## 2022-02-24 DIAGNOSIS — N185 Chronic kidney disease, stage 5: Secondary | ICD-10-CM | POA: Insufficient documentation

## 2022-02-24 DIAGNOSIS — N186 End stage renal disease: Secondary | ICD-10-CM

## 2022-02-24 DIAGNOSIS — T82898A Other specified complication of vascular prosthetic devices, implants and grafts, initial encounter: Secondary | ICD-10-CM | POA: Diagnosis not present

## 2022-02-24 HISTORY — PX: A/V FISTULAGRAM: CATH118298

## 2022-02-24 LAB — POCT I-STAT, CHEM 8
BUN: 68 mg/dL — ABNORMAL HIGH (ref 6–20)
Calcium, Ion: 1.17 mmol/L (ref 1.15–1.40)
Chloride: 109 mmol/L (ref 98–111)
Creatinine, Ser: 7.7 mg/dL — ABNORMAL HIGH (ref 0.44–1.00)
Glucose, Bld: 87 mg/dL (ref 70–99)
HCT: 34 % — ABNORMAL LOW (ref 36.0–46.0)
Hemoglobin: 11.6 g/dL — ABNORMAL LOW (ref 12.0–15.0)
Potassium: 4.1 mmol/L (ref 3.5–5.1)
Sodium: 143 mmol/L (ref 135–145)
TCO2: 24 mmol/L (ref 22–32)

## 2022-02-24 SURGERY — A/V FISTULAGRAM
Anesthesia: LOCAL | Laterality: Left

## 2022-02-24 MED ORDER — LIDOCAINE HCL (PF) 1 % IJ SOLN
INTRAMUSCULAR | Status: DC | PRN
  Administered 2022-02-24: 2 mL

## 2022-02-24 MED ORDER — IODIXANOL 320 MG/ML IV SOLN
INTRAVENOUS | Status: DC | PRN
  Administered 2022-02-24: 10 mL

## 2022-02-24 MED ORDER — HEPARIN (PORCINE) IN NACL 1000-0.9 UT/500ML-% IV SOLN
INTRAVENOUS | Status: DC | PRN
  Administered 2022-02-24 (×2): 500 mL

## 2022-02-24 SURGICAL SUPPLY — 9 items
BAG SNAP BAND KOVER 36X36 (MISCELLANEOUS) ×1 IMPLANT
COVER DOME SNAP 22 D (MISCELLANEOUS) ×2 IMPLANT
KIT MICROPUNCTURE NIT STIFF (SHEATH) IMPLANT
PROTECTION STATION PRESSURIZED (MISCELLANEOUS) ×1
SHEATH PROBE COVER 6X72 (BAG) ×2 IMPLANT
STATION PROTECTION PRESSURIZED (MISCELLANEOUS) ×1 IMPLANT
STOPCOCK MORSE 400PSI 3WAY (MISCELLANEOUS) ×2 IMPLANT
TRAY PV CATH (CUSTOM PROCEDURE TRAY) ×1 IMPLANT
TUBING CIL FLEX 10 FLL-RA (TUBING) ×1 IMPLANT

## 2022-02-24 NOTE — Interval H&P Note (Signed)
History and Physical Interval Note:  02/24/2022 9:22 AM  Margaret Aguilar  has presented today for surgery, with the diagnosis of instage renal.  The various methods of treatment have been discussed with the patient and family. After consideration of risks, benefits and other options for treatment, the patient has consented to  Procedure(s): A/V Fistulagram (Left) as a surgical intervention.  The patient's history has been reviewed, patient examined, no change in status, stable for surgery.  I have reviewed the patient's chart and labs.  Questions were answered to the patient's satisfaction.     Servando Snare

## 2022-02-24 NOTE — Op Note (Signed)
    Patient name: Margaret Aguilar MRN: [id number] DOB: April 17, 1960 Sex: female  02/24/2022 Pre-operative Diagnosis: ckd 5, pulsatile left arm avf Post-operative diagnosis:  Same Surgeon:  Eda Paschal. Donzetta Matters, MD Procedure Performed: 1.  US guided cannulation of left arm AV fistula 2.  Left upper extremity fistulogram  Indications: 61 year old female with chronic kidney disease has never been on dialysis has a two-stage left cephalic arm AV fistula.  She does have pulsatility and there is concerned this will not be usable when she starts dialysis and she is indicated for fistulogram with possible intervention.  Findings: The fistula is very pulsatile very large throughout the upper arm and very tortuous.  Cephalic arch appears to have a very tight stenosis as it enters the subclavian vein.  Given that the patient is chronic kidney disease I do not want to give her more contrast than necessary and appears that she would benefit from left cephalic vein turndown which we will plan in the near future.   Procedure:  The patient was identified in the holding area and taken to room 8.  The patient was then placed supine on the table and prepped and draped in the usual sterile fashion.  A time out was called.  Ultrasound was used to evaluate the left arm AV fistula.  There is no signs 1% lidocaine cannulated with micropuncture needle followed by wire and sheath.  And images saved the permanent record.  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  The sheath was removed and the cannulation zone was suture-ligated with 4-0 Monocryl.  She tolerated procedure without any complication.  Contrast: 10 cc    Nesha Counihan C. Donzetta Matters, MD [facility] Office: [phone] Pager: [phone]

## 2022-02-26 ENCOUNTER — Other Ambulatory Visit: Payer: Self-pay | Admitting: *Deleted

## 2022-02-26 DIAGNOSIS — N186 End stage renal disease: Secondary | ICD-10-CM

## 2022-03-03 NOTE — Telephone Encounter (Signed)
Unable to reach pt: Phone goes directly to voice mail and states that it has not been set up yet and cannot leave a message

## 2022-03-04 NOTE — Telephone Encounter (Signed)
Unable to reach pt: Phone goes directly to voice mail and states that it has not been set up yet and cannot leave a message

## 2022-03-06 DIAGNOSIS — G4733 Obstructive sleep apnea (adult) (pediatric): Secondary | ICD-10-CM | POA: Diagnosis not present

## 2022-03-10 ENCOUNTER — Telehealth: Payer: Self-pay | Admitting: Pharmacist

## 2022-03-10 ENCOUNTER — Other Ambulatory Visit: Payer: Medicaid Other | Admitting: Pharmacist

## 2022-03-10 DIAGNOSIS — Z01818 Encounter for other preprocedural examination: Secondary | ICD-10-CM | POA: Diagnosis not present

## 2022-03-10 NOTE — Telephone Encounter (Signed)
Unable to reach pt. Phone goes directly to busy signal.   

## 2022-03-10 NOTE — Progress Notes (Unsigned)
Attempted to contact patient for scheduled appointment for medication management. Left HIPAA compliant message for patient to return my call at their convenience.   

## 2022-03-11 ENCOUNTER — Other Ambulatory Visit: Payer: Self-pay

## 2022-03-11 ENCOUNTER — Encounter: Payer: Self-pay | Admitting: Emergency Medicine

## 2022-03-11 ENCOUNTER — Ambulatory Visit
Admission: EM | Admit: 2022-03-11 | Discharge: 2022-03-11 | Disposition: A | Discharge status: Home / Self Care | Payer: Medicaid Other | Attending: Family Medicine | Admitting: Family Medicine

## 2022-03-11 DIAGNOSIS — L0291 Cutaneous abscess, unspecified: Secondary | ICD-10-CM

## 2022-03-11 MED ORDER — MUPIROCIN 2 % EX OINT: 1.0000 | TOPICAL_OINTMENT | Freq: Two times a day (BID) | CUTANEOUS | 0 refills | Status: DC

## 2022-03-11 MED ORDER — CHLORHEXIDINE GLUCONATE 4 % EX LIQD: Freq: Every day | CUTANEOUS | 0 refills | Status: DC | PRN

## 2022-03-11 MED ORDER — CEPHALEXIN 500 MG PO CAPS: 500.0000 mg | ORAL_CAPSULE | Freq: Two times a day (BID) | ORAL | 0 refills | Status: DC

## 2022-03-11 NOTE — ED Triage Notes (Signed)
Pt reports "spot that needed to be lanced" x6 months. Pt reports has been seen by pcp x2. Pt reports site initially started as "soft spot on back" that has hardened and reports "popped" x3 days ago.

## 2022-03-11 NOTE — ED Provider Notes (Signed)
[facility]    CSN: [id number] Arrival date & time: 03/11/22  1134      History   Chief Complaint Chief Complaint  Patient presents with   Recurrent Skin Infections    HPI Margaret Aguilar is a 61 y.o. female.   Patient presenting today with an area on her mid back that is been present for more than 6 months but flares up occasionally.  She believes it to be an abscess and states that started draining bloody drainage about 3 days ago and becoming much more painful.  She has been taping a maxi pad to her back and cleaning the area daily in the shower.  Denies fever, chills, body aches, sweats, nausea, vomiting.    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) (HCC)    Palpitations    Pneumonia    several   Sleep apnea    hasn't gotten cpap yet   SOB (shortness of breath)    Vitamin D-dependent rickets     Patient Active Problem List   Diagnosis Date Noted   Dermoid cyst of skin of back 01/27/2022   Routine health maintenance 11/15/2021   Microalbuminuria 12/22/2019   Essential hypertension 12/14/2019   Mild intermittent asthma without complication 15/08/6977   Vitamin D deficiency 11/24/2019   Bradycardia 08/16/2018   Gout attack 08/09/2018   Acute on chronic renal failure ([place]) 08/08/2018   CKD (chronic kidney disease) stage 4, GFR 15-29 ml/min ([place]) 01/09/2017   Spondylosis of lumbosacral region 10/09/2016   Type 2 diabetes mellitus, without long-term current use of insulin ([place]) 09/18/2016   IDA (iron deficiency anemia) 12/14/2013   Guaiac + stool  12/14/2013   Hypertension 09/28/2013   Diabetes ([place]) 09/28/2013   Renal insufficiency 09/28/2013   Morbid obesity ([place]) 09/28/2013   Obstructive sleep apnea on CPAP 06/05/2011   Hyperlipidemia 06/05/2011   Gastroesophageal reflux disease 06/05/2011   Arthritis 06/05/2011    Past Surgical History:  Procedure Laterality Date   A/V FISTULAGRAM Left 02/24/2022   Procedure: A/V Fistulagram;  Surgeon: Waynetta Sandy, MD;  Location: [facility];  Service: Cardiovascular;  Laterality: Left;   AV FISTULA PLACEMENT Left 10/19/2018   Procedure: LEFT ARTERIOVENOUS (AV) FISTULA CREATION;  Surgeon: Waynetta Sandy, MD;  Location: [facility];  Service: Vascular;  Laterality: Left;   [place] Left 06/30/2019   Procedure: Bascilic Vein Transposition;  Surgeon: Waynetta Sandy, MD;  Location: [facility];  Service: Vascular;  Laterality: Left;   CESAREAN SECTION     x 1   COLONOSCOPY N/A 01/12/2014   Procedure: COLONOSCOPY;  Surgeon: Rogene Houston, MD;  [facility];  Service: Endoscopy;  Laterality: N/A;  225   D&C     TUBAL LIGATION      OB History     Gravida  5   Para  3  Term      Preterm      AB      Living         SAB      IAB      Ectopic      Multiple      Live Births               Home Medications    Prior to Admission medications   Medication Sig Start Date End Date Taking? Authorizing Provider  cephALEXin (KEFLEX) 500 MG capsule Take 1 capsule (500 mg total) by mouth 2 (two) times daily. 03/11/22  Yes Volney American, PA-C  chlorhexidine (HIBICLENS) 4 % external liquid Apply topically daily as needed. 03/11/22  Yes Volney American, PA-C  mupirocin ointment (BACTROBAN) 2 % Apply 1 Application topically 2 (two) times daily. 03/11/22  Yes Volney American, PA-C  albuterol (PROVENTIL) (2.5 MG/3ML) 0.083% nebulizer solution Take 3 mLs (2.5 mg total) by nebulization every 6 (six) hours  as needed for wheezing or shortness of breath. 12/22/19   [name], NP  albuterol (VENTOLIN HFA) 108 (90 Base) MCG/ACT inhaler Inhale 2 puffs into the lungs every 6 (six) hours as needed for wheezing or shortness of breath. 01/27/22   [name], NP  allopurinol (ZYLOPRIM) 100 MG tablet Take 0.5 tablets (50 mg total) by mouth 2 (two) times a week. 12/12/21   [name], NP  amLODipine (NORVASC) 10 MG tablet Take 1 tablet (10 mg total) by mouth daily. 11/15/21 02/20/22  [name], NP  calcitRIOL (ROCALTROL) 0.25 MCG capsule Take 0.25 mcg by mouth daily.    [provider]  carvedilol (COREG) 12.5 MG tablet Take 1 tablet (12.5 mg total) by mouth 2 (two) times daily with a meal. Patient taking differently: Take 6.25 mg by mouth 2 (two) times daily with a meal. 11/15/21   [name], NP  colchicine 0.6 MG tablet Take 0.3 mg once, and then every 3 days until gout flare is resolved Patient taking differently: Take 0.6 mg by mouth See admin instructions. Take 0.3 mg once, and then every 3 days until gout flare is resolved as needed 12/11/21   [name], NP  docusate sodium (COLACE) 100 MG capsule Take 1 capsule (100 mg total) by mouth 2 (two) times daily. Patient taking differently: Take 100 mg by mouth daily as needed for mild constipation or moderate constipation. 08/15/21   [name], NP  furosemide (LASIX) 40 MG tablet Take 1 tablet (40 mg total) by mouth daily. 11/15/21 02/20/22  [name], NP  montelukast (SINGULAIR) 10 MG tablet Take 1 tablet (10 mg total) by mouth at bedtime. 11/15/21 11/15/22  [name], NP  ondansetron (ZOFRAN) 4 MG tablet Take 1 tablet (4 mg total) by mouth every 8 (eight) hours as needed for nausea or vomiting. 08/15/21   [name], NP  promethazine-dextromethorphan (PROMETHAZINE-DM) 6.25-15 MG/5ML syrup Take 5 mLs by mouth at bedtime as needed for cough. 07/12/21   Jaynee Eagles, PA-C  Semaglutide,0.25 or 0.5MG/DOS, (OZEMPIC,  0.25 OR 0.5 MG/DOSE,) 2 MG/3ML SOPN Inject 0.25 mg weekly for 4 weeks, then increase to 0.5 mg weekly 11/15/21   [name], NP    Family History Family History  Problem Relation Age of Onset   Diabetes Mother    Hyperlipidemia Mother    Depression Mother    Anxiety disorder Mother    Sleep apnea Mother  Eating disorder Mother    Obesity Mother    Hyperlipidemia Father    Hypertension Father    Heart disease Father    Sleep apnea Father    Alcoholism Father    Drug abuse Father    Sleep apnea Sister    Stroke Brother    Diabetes Brother     Social History Social History   Tobacco Use   Smoking status: Never   Smokeless tobacco: Never  Vaping Use   Vaping Use: Never used  Substance Use Topics   Alcohol use: No   Drug use: Yes    Types: Marijuana     Allergies   Nutritional supplements and Latex   Review of Systems Review of Systems Per HPI  Physical Exam Triage Vital Signs ED Triage Vitals  Enc Vitals Group     BP 03/11/22 1321 (!) 163/85     Pulse Rate 03/11/22 1321 (!) 54     Resp 03/11/22 1321 20     Temp 03/11/22 1321 97.9 F (36.6 C)     Temp Source 03/11/22 1321 Oral     SpO2 03/11/22 1321 100 %     Weight --      Height --      Head Circumference --      Peak Flow --      Pain Score 03/11/22 1316 7     Pain Loc --      Pain Edu? --      Excl. in [place]? --    No data found.  Updated Vital Signs BP (!) 163/85 (BP Location: Right Arm)   Pulse (!) 54   Temp 97.9 F (36.6 C) (Oral)   Resp 20   LMP  (LMP Unknown)   SpO2 100%   Visual Acuity Right Eye Distance:   Left Eye Distance:   Bilateral Distance:    Right Eye Near:   Left Eye Near:    Bilateral Near:     Physical Exam Vitals and nursing note reviewed.  Constitutional:      Appearance: Normal appearance. She is not ill-appearing.  HENT:     Head: Atraumatic.  Eyes:     Extraocular Movements: Extraocular movements intact.     Conjunctiva/sclera: Conjunctivae normal.   Cardiovascular:     Rate and Rhythm: Normal rate and regular rhythm.     Heart sounds: Normal heart sounds.  Pulmonary:     Effort: Pulmonary effort is normal.     Breath sounds: Normal breath sounds.  Musculoskeletal:        General: Normal range of motion.     Cervical back: Normal range of motion and neck supple.  Skin:    General: Skin is warm.     Comments: 1.5 cm abscess to mid back, with dried bloody drainage present.  No fluctuance or induration currently.  Tender to palpation  Neurological:     Mental Status: She is alert and oriented to person, place, and time.  Psychiatric:        Mood and Affect: Mood normal.        Thought Content: Thought content normal.        Judgment: Judgment normal.      UC Treatments / Results  Labs (all labs ordered are listed, but only abnormal results are displayed) Labs Reviewed - No data to display  EKG   Radiology No results found.  Procedures Procedures (including critical care time)  Medications Ordered in UC Medications - No data  to display  Initial Impression / Assessment and Plan / UC Course  I have reviewed the triage vital signs and the nursing notes.  Pertinent labs & imaging results that were available during my care of the patient were reviewed by me and considered in my medical decision making (see chart for details).     Treat with Keflex, Hibiclens, Bactroban, wound cleaned and dressed today prior to discharge.  Dermatology information given for excision.  Not a candidate for I&D today as the area is already spontaneously draining.  Final Clinical Impressions(s) / UC Diagnoses   Final diagnoses:  Abscess   Discharge Instructions   None    ED Prescriptions     Medication Sig Dispense Auth. Provider   cephALEXin (KEFLEX) 500 MG capsule Take 1 capsule (500 mg total) by mouth 2 (two) times daily. 14 capsule Volney American, [place]   chlorhexidine (HIBICLENS) 4 % external liquid Apply topically  daily as needed. 120 mL Volney American, PA-C   mupirocin ointment (BACTROBAN) 2 % Apply 1 Application topically 2 (two) times daily. 22 g Volney American, [place]      PDMP not reviewed this encounter.   Volney American, [place] 03/11/22 1443

## 2022-03-14 ENCOUNTER — Other Ambulatory Visit: Payer: Self-pay | Admitting: Nurse Practitioner

## 2022-03-14 DIAGNOSIS — M109 Gout, unspecified: Secondary | ICD-10-CM | POA: Diagnosis not present

## 2022-03-14 DIAGNOSIS — N189 Chronic kidney disease, unspecified: Secondary | ICD-10-CM | POA: Diagnosis not present

## 2022-03-14 DIAGNOSIS — I120 Hypertensive chronic kidney disease with stage 5 chronic kidney disease or end stage renal disease: Secondary | ICD-10-CM | POA: Diagnosis not present

## 2022-03-14 DIAGNOSIS — N185 Chronic kidney disease, stage 5: Secondary | ICD-10-CM | POA: Diagnosis not present

## 2022-03-14 DIAGNOSIS — N2581 Secondary hyperparathyroidism of renal origin: Secondary | ICD-10-CM | POA: Diagnosis not present

## 2022-03-14 DIAGNOSIS — I770 Arteriovenous fistula, acquired: Secondary | ICD-10-CM | POA: Diagnosis not present

## 2022-03-14 DIAGNOSIS — I10 Essential (primary) hypertension: Secondary | ICD-10-CM

## 2022-03-14 DIAGNOSIS — E1129 Type 2 diabetes mellitus with other diabetic kidney complication: Secondary | ICD-10-CM | POA: Diagnosis not present

## 2022-03-14 DIAGNOSIS — D631 Anemia in chronic kidney disease: Secondary | ICD-10-CM | POA: Diagnosis not present

## 2022-03-17 ENCOUNTER — Ambulatory Visit (HOSPITAL_COMMUNITY): Admit: 2022-03-17 | Payer: Medicaid Other | Admitting: Gastroenterology

## 2022-03-17 ENCOUNTER — Encounter (HOSPITAL_COMMUNITY): Payer: Self-pay

## 2022-03-17 SURGERY — ESOPHAGOGASTRODUODENOSCOPY (EGD) WITH PROPOFOL
Anesthesia: Monitor Anesthesia Care

## 2022-03-25 DIAGNOSIS — I4581 Long QT syndrome: Secondary | ICD-10-CM | POA: Diagnosis not present

## 2022-03-25 DIAGNOSIS — R001 Bradycardia, unspecified: Secondary | ICD-10-CM | POA: Diagnosis not present

## 2022-03-26 DIAGNOSIS — I4581 Long QT syndrome: Secondary | ICD-10-CM | POA: Diagnosis not present

## 2022-03-26 DIAGNOSIS — R001 Bradycardia, unspecified: Secondary | ICD-10-CM | POA: Diagnosis not present

## 2022-03-26 DIAGNOSIS — Z01818 Encounter for other preprocedural examination: Secondary | ICD-10-CM | POA: Diagnosis not present

## 2022-03-27 ENCOUNTER — Encounter (HOSPITAL_COMMUNITY): Payer: Self-pay | Admitting: Vascular Surgery

## 2022-03-27 ENCOUNTER — Other Ambulatory Visit: Payer: Self-pay

## 2022-03-27 NOTE — Progress Notes (Signed)
Spoke with pt for pre-op call. Pt denies cardiac history. Pt is treated for HTN and Diabetes. Pt is on Ozempic, she states she has not taken it since before she had a procedure done on 02/24/22. She states she knew this surgery was coming up and did not restart it.   Shower instructions given to pt and she voiced understanding.

## 2022-03-28 ENCOUNTER — Ambulatory Visit (HOSPITAL_COMMUNITY)
Admission: RE | Admit: 2022-03-28 | Discharge: 2022-03-28 | Disposition: A | Discharge status: Home / Self Care | Payer: Medicaid Other | Attending: Vascular Surgery | Admitting: Vascular Surgery

## 2022-03-28 ENCOUNTER — Ambulatory Visit (HOSPITAL_BASED_OUTPATIENT_CLINIC_OR_DEPARTMENT_OTHER): Payer: Medicaid Other | Admitting: Certified Registered Nurse Anesthetist

## 2022-03-28 ENCOUNTER — Encounter (HOSPITAL_COMMUNITY)
Admission: RE | Disposition: A | Discharge status: Home / Self Care | Payer: Self-pay | Source: Home / Self Care | Attending: Vascular Surgery

## 2022-03-28 ENCOUNTER — Ambulatory Visit (HOSPITAL_COMMUNITY): Payer: Medicaid Other | Admitting: Certified Registered Nurse Anesthetist

## 2022-03-28 ENCOUNTER — Other Ambulatory Visit: Payer: Self-pay

## 2022-03-28 DIAGNOSIS — I120 Hypertensive chronic kidney disease with stage 5 chronic kidney disease or end stage renal disease: Secondary | ICD-10-CM

## 2022-03-28 DIAGNOSIS — I130 Hypertensive heart and chronic kidney disease with heart failure and stage 1 through stage 4 chronic kidney disease, or unspecified chronic kidney disease: Secondary | ICD-10-CM | POA: Insufficient documentation

## 2022-03-28 DIAGNOSIS — N185 Chronic kidney disease, stage 5: Secondary | ICD-10-CM | POA: Insufficient documentation

## 2022-03-28 DIAGNOSIS — Z6841 Body Mass Index (BMI) 40.0 and over, adult: Secondary | ICD-10-CM | POA: Insufficient documentation

## 2022-03-28 DIAGNOSIS — F418 Other specified anxiety disorders: Secondary | ICD-10-CM

## 2022-03-28 DIAGNOSIS — N186 End stage renal disease: Secondary | ICD-10-CM

## 2022-03-28 DIAGNOSIS — E1122 Type 2 diabetes mellitus with diabetic chronic kidney disease: Secondary | ICD-10-CM

## 2022-03-28 DIAGNOSIS — G4730 Sleep apnea, unspecified: Secondary | ICD-10-CM | POA: Insufficient documentation

## 2022-03-28 DIAGNOSIS — T82898A Other specified complication of vascular prosthetic devices, implants and grafts, initial encounter: Secondary | ICD-10-CM | POA: Diagnosis not present

## 2022-03-28 HISTORY — PX: FISTULA SUPERFICIALIZATION: SHX6341

## 2022-03-28 LAB — POCT I-STAT, CHEM 8
BUN: 72 mg/dL — ABNORMAL HIGH (ref 6–20)
Calcium, Ion: 1.09 mmol/L — ABNORMAL LOW (ref 1.15–1.40)
Chloride: 111 mmol/L (ref 98–111)
Creatinine, Ser: 6.6 mg/dL — ABNORMAL HIGH (ref 0.44–1.00)
Glucose, Bld: 108 mg/dL — ABNORMAL HIGH (ref 70–99)
HCT: 36 % (ref 36.0–46.0)
Hemoglobin: 12.2 g/dL (ref 12.0–15.0)
Potassium: 4.2 mmol/L (ref 3.5–5.1)
Sodium: 141 mmol/L (ref 135–145)
TCO2: 20 mmol/L — ABNORMAL LOW (ref 22–32)

## 2022-03-28 LAB — GLUCOSE, CAPILLARY
Glucose-Capillary: 108 mg/dL — ABNORMAL HIGH (ref 70–99)
Glucose-Capillary: 100 mg/dL — ABNORMAL HIGH (ref 70–99)
Glucose-Capillary: 124 mg/dL — ABNORMAL HIGH (ref 70–99)

## 2022-03-28 LAB — SURGICAL PCR SCREEN
MRSA, PCR: NEGATIVE
Staphylococcus aureus: NEGATIVE

## 2022-03-28 SURGERY — FISTULA SUPERFICIALIZATION
Anesthesia: Regional | Site: Arm Upper | Laterality: Left

## 2022-03-28 MED ORDER — INSULIN ASPART 100 UNIT/ML IJ SOLN: 0.0000 [IU] | INTRAMUSCULAR | Status: DC | PRN

## 2022-03-28 MED ORDER — FENTANYL CITRATE (PF) 250 MCG/5ML IJ SOLN
INTRAMUSCULAR | Status: DC | PRN
  Administered 2022-03-28: 25 ug via INTRAVENOUS

## 2022-03-28 MED ORDER — BUPIVACAINE HCL (PF) 0.5 % IJ SOLN
INTRAMUSCULAR | Status: DC | PRN
  Administered 2022-03-28: 25 mL via PERINEURAL

## 2022-03-28 MED ORDER — ONDANSETRON HCL 4 MG/2ML IJ SOLN
INTRAMUSCULAR | Status: DC | PRN
  Administered 2022-03-28: 4 mg via INTRAVENOUS

## 2022-03-28 MED ORDER — LIDOCAINE-EPINEPHRINE (PF) 1 %-1:200000 IJ SOLN
INTRAMUSCULAR | Status: DC | PRN
  Administered 2022-03-28: 10 mL

## 2022-03-28 MED ORDER — OXYCODONE-ACETAMINOPHEN 5-325 MG PO TABS: 1.0000 | ORAL_TABLET | Freq: Four times a day (QID) | ORAL | 0 refills | Status: DC | PRN

## 2022-03-28 MED ORDER — CEFAZOLIN SODIUM-DEXTROSE 2-4 GM/100ML-% IV SOLN
2.0000 g | INTRAVENOUS | Status: AC
  Administered 2022-03-28: 2 g via INTRAVENOUS
  Filled 2022-03-28: qty 100

## 2022-03-28 MED ORDER — ORAL CARE MOUTH RINSE: 15.0000 mL | Freq: Once | OROMUCOSAL | Status: AC

## 2022-03-28 MED ORDER — FENTANYL CITRATE (PF) 250 MCG/5ML IJ SOLN
INTRAMUSCULAR | Status: AC
  Filled 2022-03-28: qty 5

## 2022-03-28 MED ORDER — MIDAZOLAM HCL 2 MG/2ML IJ SOLN
INTRAMUSCULAR | Status: DC | PRN
  Administered 2022-03-28: .5 mg via INTRAVENOUS

## 2022-03-28 MED ORDER — CHLORHEXIDINE GLUCONATE 0.12 % MT SOLN
15.0000 mL | Freq: Once | OROMUCOSAL | Status: AC
  Administered 2022-03-28: 15 mL via OROMUCOSAL
  Filled 2022-03-28: qty 15

## 2022-03-28 MED ORDER — PROPOFOL 500 MG/50ML IV EMUL
INTRAVENOUS | Status: DC | PRN
  Administered 2022-03-28: 50 ug/kg/min via INTRAVENOUS

## 2022-03-28 MED ORDER — LIDOCAINE-EPINEPHRINE (PF) 1 %-1:200000 IJ SOLN
INTRAMUSCULAR | Status: AC
  Filled 2022-03-28: qty 30

## 2022-03-28 MED ORDER — MIDAZOLAM HCL 2 MG/2ML IJ SOLN
INTRAMUSCULAR | Status: AC
  Filled 2022-03-28: qty 2

## 2022-03-28 MED ORDER — CHLORHEXIDINE GLUCONATE 4 % EX LIQD: 60.0000 mL | Freq: Once | CUTANEOUS | Status: DC

## 2022-03-28 MED ORDER — ONDANSETRON HCL 4 MG/2ML IJ SOLN
INTRAMUSCULAR | Status: AC
  Filled 2022-03-28: qty 2

## 2022-03-28 MED ORDER — SODIUM CHLORIDE 0.9 % IV SOLN: INTRAVENOUS | Status: DC

## 2022-03-28 MED ORDER — STERILE WATER FOR IRRIGATION IR SOLN
Status: DC | PRN
  Administered 2022-03-28: 1000 mL

## 2022-03-28 MED ORDER — HEPARIN 6000 UNIT IRRIGATION SOLUTION
Status: AC
  Filled 2022-03-28: qty 500

## 2022-03-28 MED ORDER — EPHEDRINE 5 MG/ML INJ
INTRAVENOUS | Status: AC
  Filled 2022-03-28: qty 5

## 2022-03-28 MED ORDER — EPHEDRINE SULFATE-NACL 50-0.9 MG/10ML-% IV SOSY
PREFILLED_SYRINGE | INTRAVENOUS | Status: DC | PRN
  Administered 2022-03-28 (×2): 2.5 mg via INTRAVENOUS
  Administered 2022-03-28: 5 mg via INTRAVENOUS

## 2022-03-28 MED ORDER — 0.9 % SODIUM CHLORIDE (POUR BTL) OPTIME
TOPICAL | Status: DC | PRN
  Administered 2022-03-28: 1000 mL

## 2022-03-28 MED ORDER — HEPARIN 6000 UNIT IRRIGATION SOLUTION
Status: DC | PRN
  Administered 2022-03-28: 1

## 2022-03-28 MED ORDER — PAPAVERINE HCL 30 MG/ML IJ SOLN
INTRAMUSCULAR | Status: AC
  Filled 2022-03-28: qty 2

## 2022-03-28 SURGICAL SUPPLY — 33 items
ADH SKN CLS APL DERMABOND .7 (GAUZE/BANDAGES/DRESSINGS) ×1
ARMBAND PINK RESTRICT EXTREMIT (MISCELLANEOUS) ×2 IMPLANT
BAG COUNTER SPONGE SURGICOUNT (BAG) ×1 IMPLANT
BAG SPNG CNTER NS LX DISP (BAG) ×1
CANISTER SUCT 3000ML PPV (MISCELLANEOUS) ×1 IMPLANT
CLIP LIGATING EXTRA MED SLVR (CLIP) ×2 IMPLANT
CLIP LIGATING EXTRA SM BLUE (MISCELLANEOUS) ×1 IMPLANT
COVER PROBE W GEL 5X96 (DRAPES) ×1 IMPLANT
DERMABOND ADVANCED .7 DNX12 (GAUZE/BANDAGES/DRESSINGS) ×2 IMPLANT
ELECT REM PT RETURN 9FT ADLT (ELECTROSURGICAL) ×1
ELECTRODE REM PT RTRN 9FT ADLT (ELECTROSURGICAL) ×2 IMPLANT
GLOVE BIO SURGEON STRL SZ7.5 (GLOVE) ×2 IMPLANT
GOWN STRL REUS W/ TWL LRG LVL3 (GOWN DISPOSABLE) ×2 IMPLANT
GOWN STRL REUS W/ TWL XL LVL3 (GOWN DISPOSABLE) ×1 IMPLANT
GOWN STRL REUS W/TWL LRG LVL3 (GOWN DISPOSABLE) ×2
GOWN STRL REUS W/TWL XL LVL3 (GOWN DISPOSABLE) ×1
KIT BASIN OR (CUSTOM PROCEDURE TRAY) ×2 IMPLANT
KIT TURNOVER KIT B (KITS) ×1 IMPLANT
LOOP VESSEL MINI RED (MISCELLANEOUS) IMPLANT
NS IRRIG 1000ML POUR BTL (IV SOLUTION) ×2 IMPLANT
PACK CV ACCESS (CUSTOM PROCEDURE TRAY) ×1 IMPLANT
PAD ARMBOARD 7.5X6 YLW CONV (MISCELLANEOUS) ×2 IMPLANT
POWDER SURGICEL 3.0 GRAM (HEMOSTASIS) IMPLANT
SLING ARM FOAM STRAP LRG (SOFTGOODS) IMPLANT
SLING ARM FOAM STRAP MED (SOFTGOODS) IMPLANT
SUT MNCRL AB 4-0 PS2 18 (SUTURE) ×1 IMPLANT
SUT PROLENE 5 0 C 1 24 (SUTURE) IMPLANT
SUT PROLENE 6 0 BV (SUTURE) ×2 IMPLANT
SUT VIC AB 3-0 SH 27 (SUTURE)
SUT VIC AB 3-0 SH 27X BRD (SUTURE) ×1 IMPLANT
TOWEL GREEN STERILE (TOWEL DISPOSABLE) ×2 IMPLANT
UNDERPAD 30X36 HEAVY ABSORB (UNDERPADS AND DIAPERS) ×1 IMPLANT
WATER STERILE IRR 1000ML POUR (IV SOLUTION) ×2 IMPLANT

## 2022-03-28 NOTE — Anesthesia Procedure Notes (Signed)
Procedure Name: MAC Date/Time: 03/28/2022 10:04 AM  Performed by: Janene [name], CRNAPre-anesthesia Checklist: Patient identified, Emergency Drugs available, Suction available and Patient being monitored Patient Re-evaluated:Patient Re-evaluated prior to induction Oxygen Delivery Method: Simple face mask Induction Type: IV induction Placement Confirmation: positive ETCO2 Dental Injury: Teeth and Oropharynx as per pre-operative assessment

## 2022-03-28 NOTE — Anesthesia Preprocedure Evaluation (Addendum)
Anesthesia Evaluation  Patient identified by MRN, date of birth, ID band Patient awake    Reviewed: Allergy & Precautions, NPO status , Patient's Chart, lab work & pertinent test results  Airway Mallampati: III  TM Distance: >3 FB Neck ROM: Full    Dental no notable dental hx. (+) Upper Dentures, Partial Lower   Pulmonary asthma , sleep apnea and Continuous Positive Airway Pressure Ventilation    Pulmonary exam normal breath sounds clear to auscultation       Cardiovascular hypertension, Normal cardiovascular exam Rhythm:Regular Rate:Normal     Neuro/Psych  PSYCHIATRIC DISORDERS Anxiety Depression       GI/Hepatic ,GERD  ,,  Endo/Other  diabetes  Morbid obesity (BMI 40.8)  Renal/GU CRFRenal diseaseLab Results      Component                Value               Date                      CREATININE               6.60 (H)            03/28/2022                BUN                      72 (H)              03/28/2022                NA                       141                 03/28/2022                K                        4.2                 03/28/2022                CL                       111                 03/28/2022                         Musculoskeletal  (+) Arthritis ,    Abdominal   Peds  Hematology Lab Results      Component                Value               Date                             HGB                      12.2                03/28/2022                HCT  36.0                03/28/2022                       Anesthesia Other Findings All: Latex  Reproductive/Obstetrics                             Anesthesia Physical Anesthesia Plan  ASA: 3  Anesthesia Plan: Regional   Post-op Pain Management: Regional block* and Minimal or no pain anticipated   Induction:   PONV Risk Score and Plan: 2 and Treatment may vary due to age or medical condition,  Ondansetron and TIVA  Airway Management Planned: Nasal Cannula and Natural Airway  Additional Equipment: None  Intra-op Plan:   Post-operative Plan:   Informed Consent: I have reviewed the patients History and Physical, chart, labs and discussed the procedure including the risks, benefits and alternatives for the proposed anesthesia with the patient or authorized representative who has indicated his/her understanding and acceptance.     Dental advisory given  Plan Discussed with: CRNA and Anesthesiologist  Anesthesia Plan Comments: (R ISB)       Anesthesia Quick Evaluation

## 2022-03-28 NOTE — H&P (Signed)
History of Present Illness    Margaret Aguilar is a 61 y.o. (09-14-1960) female who presents for re-evaluation of fistula.  She underwent left brachiocephalic AV fistula creation on 10/19/2018 by Dr. Donzetta Matters.  Postop, the fistula was still too deep and torturous for use.  She then required revision and transposition of the fistula on 06/30/2019 by Dr. Donzetta Matters.  Her fistula was then much closer to the surface and would be ready for access when the patient would need dialysis.   She currently does not require dialysis.  She states that her nephrologist has quoted her about a 62-monthtimeframe before she probably will need to start.  They have referred her here for reevaluation of the fistula.  At one of her recent encounters with her nephrologist, they noted that her fistula had low flow through it and a poorly auscultated bruit throughout.   At today's visit, she denies any pain anywhere in her left arm.  She denies any steal symptoms.          Current Outpatient Medications  Medication Sig Dispense Refill   albuterol (PROVENTIL) (2.5 MG/3ML) 0.083% nebulizer solution Take 3 mLs (2.5 mg total) by nebulization every 6 (six) hours as needed for wheezing or shortness of breath. 150 mL 11   albuterol (VENTOLIN HFA) 108 (90 Base) MCG/ACT inhaler Inhale 2 puffs into the lungs every 6 (six) hours as needed for wheezing or shortness of breath. 8 g 0   allopurinol (ZYLOPRIM) 100 MG tablet Take 0.5 tablets (50 mg total) by mouth 2 (two) times a week. 15 tablet 2   calcitRIOL (ROCALTROL) 0.25 MCG capsule Take 0.25 mcg by mouth daily.       carvedilol (COREG) 12.5 MG tablet Take 1 tablet (12.5 mg total) by mouth 2 (two) times daily with a meal. 60 tablet 3   colchicine 0.6 MG tablet Take 0.3 mg once, and then every 3 days until gout flare is resolved 30 tablet 1   docusate sodium (COLACE) 100 MG capsule Take 1 capsule (100 mg total) by mouth 2 (two) times daily. 10 capsule 0   montelukast (SINGULAIR)  10 MG tablet Take 1 tablet (10 mg total) by mouth at bedtime. 90 tablet 3   ondansetron (ZOFRAN) 4 MG tablet Take 1 tablet (4 mg total) by mouth every 8 (eight) hours as needed for nausea or vomiting. 20 tablet 0   promethazine-dextromethorphan (PROMETHAZINE-DM) 6.25-15 MG/5ML syrup Take 5 mLs by mouth at bedtime as needed for cough. 100 mL 0   Semaglutide,0.25 or 0.'5MG'$ /DOS, (OZEMPIC, 0.25 OR 0.5 MG/DOSE,) 2 MG/3ML SOPN Inject 0.25 mg weekly for 4 weeks, then increase to 0.5 mg weekly 3 mL 2   amLODipine (NORVASC) 10 MG tablet Take 1 tablet (10 mg total) by mouth daily. 30 tablet 2   furosemide (LASIX) 40 MG tablet Take 1 tablet (40 mg total) by mouth daily. 30 tablet 2    No current facility-administered medications for this visit.      REVIEW OF SYSTEMS (negative unless checked):    Cardiac:  '[]'$  Chest pain or chest pressure? '[]'$  Shortness of breath upon activity? '[]'$  Shortness of breath when lying flat? '[]'$  Irregular heart rhythm?   Vascular:  '[]'$  Pain in calf, thigh, or hip brought on by walking? '[]'$  Pain in feet at night that wakes you up from your sleep? '[]'$  Blood clot in your veins? '[]'$  Leg swelling?   Pulmonary:  '[]'$  Oxygen at home? '[]'$   Productive cough? '[]'$  Wheezing?   Neurologic:  '[]'$  Sudden weakness in arms or legs? '[]'$  Sudden numbness in arms or legs? '[]'$  Sudden onset of difficult speaking or slurred speech? '[]'$  Temporary loss of vision in one eye? '[]'$  Problems with dizziness?   Gastrointestinal:  '[]'$  Blood in stool? '[]'$  Vomited blood?   Genitourinary:  '[]'$  Burning when urinating? '[]'$  Blood in urine?   Psychiatric:  '[]'$  Major depression   Hematologic:  '[]'$  Bleeding problems? '[]'$  Problems with blood clotting?   Dermatologic:  '[]'$  Rashes or ulcers?   Constitutional:  '[]'$  Fever or chills?   Ear/Nose/Throat:  '[]'$  Change in hearing? '[]'$  Nose bleeds? '[]'$  Sore throat?   Musculoskeletal:  '[]'$  Back pain? '[]'$  Joint pain? '[]'$  Muscle pain?     Physical Examination        Vitals:    02/18/22 1109  BP: 131/82  Pulse: (!) 55  Resp: 20  Temp: (!) 97.5 F (36.4 C)  TempSrc: Temporal  SpO2: 100%  Weight: 219 lb (99.3 kg)  Height: '5\' 2"'$  (1.575 m)    Body mass index is 40.06 kg/m.   General:  WDWN in NAD; vital signs documented above Gait: Not observed HENT: WNL, normocephalic Pulmonary: normal non-labored breathing , without Rales, rhonchi,  wheezing Cardiac: regular HR, without murmurs without carotid bruit Abdomen: soft, NT, no masses Skin: without rashes Vascular Exam/Pulses: Palpable left radial and brachial pulse Extremities: Left brachiocephalic fistula with mild pulsatility and depressed bruit on auscultation Musculoskeletal: no muscle wasting or atrophy       Neurologic: A&O X 3;  No focal weakness or paresthesias are detected Psychiatric:  The pt has Normal affect.     Non-invasive Vascular Imaging    left Arm Access Duplex  (02/18/2022):    Patent arteriovenous fistula. Arteriovenous fistula-Velocities less than 100cm/s noted.       Medical Decision Making    Gladyse Corvin Neukam is a 61 y.o. female who presents with CKD4 with previously created left brachiocephalic AVF in 6440 by Dr.Gizel Riedlinger. Revision by Dr.Zienna Ahlin in 2021. Plan for cephalic vein turndown today in OR.   Quinterrius Errington C. Donzetta Matters, MD [facility] Office: [phone] Pager: [phone]

## 2022-03-28 NOTE — Discharge Instructions (Signed)
[facility]  Discharge Instructions  AV Fistula or Graft Surgery for Dialysis Access  Please refer to the following instructions for your post-procedure care. Your surgeon or physician assistant will discuss any changes with you.  Activity  You may drive the day following your surgery, if you are comfortable and no longer taking prescription pain medication. Resume full activity as the soreness in your incision resolves.  Bathing/Showering  You may shower after you go home. Keep your incision dry for 48 hours. Do not soak in a bathtub, hot tub, or swim until the incision heals completely. You may not shower if you have a hemodialysis catheter.  Incision Care  Clean your incision with mild soap and water after 48 hours. Pat the area dry with a clean towel. You do not need a bandage unless otherwise instructed. Do not apply any ointments or creams to your incision. You may have skin glue on your incision. Do not peel it off. It will come off on its own in about one week. Your arm may swell a bit after surgery. To reduce swelling use pillows to elevate your arm so it is above your heart. Your doctor will tell you if you need to lightly wrap your arm with an ACE bandage.  Diet  Resume your normal diet. There are not special food restrictions following this procedure. In order to heal from your surgery, it is CRITICAL to get adequate nutrition. Your body requires vitamins, minerals, and protein. Vegetables are the best source of vitamins and minerals. Vegetables also provide the perfect balance of protein. Processed food has little nutritional value, so try to avoid this.  Medications  Resume taking all of your medications. If your incision is causing pain, you may take over-the counter pain relievers such as acetaminophen (Tylenol). If you were prescribed a stronger pain medication, please be aware these medications can cause nausea and constipation. Prevent  nausea by taking the medication with a snack or meal. Avoid constipation by drinking plenty of fluids and eating foods with high amount of fiber, such as fruits, vegetables, and grains.  Do not take Tylenol if you are taking prescription pain medications.  Follow up Your surgeon may want to see you in the office following your access surgery. If so, this will be arranged at the time of your surgery.  Please call us immediately for any of the following conditions:  Increased pain, redness, drainage (pus) from your incision site Fever of 101 degrees or higher Severe or worsening pain at your incision site Hand pain or numbness.  Reduce your risk of vascular disease:  Stop smoking. If you would like help, call [facility] at 1-800-QUIT-NOW 854-789-2069) or [place] at [place] your cholesterol Maintain a desired weight Control your diabetes Keep your blood pressure down  Dialysis  It will take several weeks to several months for your new dialysis access to be ready for use. Your surgeon will determine when it is okay to use it. Your nephrologist will continue to direct your dialysis. You can continue to use your Permcath until your new access is ready for use.   03/28/2022 Margaret Aguilar [id number] 01/06/1961  Surgeon(s): Waynetta Sandy, MD  Procedure(s): LEFT ARM CEPHALIC VEIN FISTULA SUPERFICIALIZATION   May stick graft immediately   May stick graft on designated area only:   X Do not stick Left AV fistula for 6 weeks    If you have any questions, please call the office at  [phone]. 

## 2022-03-28 NOTE — Op Note (Signed)
    Patient name: Margaret Aguilar MRN: [id number] DOB: Feb 02, 1961 Sex: female  03/28/2022 Pre-operative Diagnosis: [id number] Post-operative diagnosis:  Same Surgeon:  Erlene Quan C. Donzetta Matters, MD Assistant: Paulo Fruit, PA Procedure Performed: Left cephalic vein fistula turndown to axillary vein  Indications: 61 year old female with chronic kidney disease now with cephalic vein with stenosis at the cephalic arch.  She has had a superficialization in the past but would perform fistulogram with limited contrast and no indicated for turndown.  Findings: Fistula was very large and quite tortuous over the cephalic arch and this was all straightened out turndown and and to the axillary vein at completion there was very strong thrill in the runoff vein.   Procedure:  The patient was identified in the holding area and taken to the operating room where she was placed in bilateral table and MAC anesthesia was induced.  A preoperative block of been placed in the left upper extremity.  She was sterilely prepped and draped in the left upper extremity and a timeout was called antibiotics were up-to-date.  Ultrasound was used to identify the cephalic vein as well as the axillary vein into longitudinal incisions were made we dissected down to the axillary vein encircled this with vessel loop.  We then dissected down to the cephalic vein at the saphenofemoral inches and this was quite tortuous with marked for orientation.  It was then clamped proximally on the fistula and then clamped distally and transected and tied off distally.  I then freed up some soft tissue to tunneled a straight down.  The axillary vein was then clear proximally and distally transected tied off distally.  We spatulated the central side of the axillary vein and then we thoroughly send and with 5-0 Prolene suture.  Prior completion without flushing all directions.  Upon completion we obtained stasis and the wounds.  We used Doppler to confirm flow throughout the  axillary vein and there was still pulsatility arm but with improved pulsatility in the arm was well-perfused with a radial artery signal at the wrist.  Satisfied with this we obtained hemostasis and then closed in layers with Vicryl and Monocryl and Dermabond is placed at the level of the skin.  She was awakened from anesthesia having tolerated procedure without any complication.  All counts were correct at completion.  Assistant was necessary to facilitate exposure of both veins and to assist with tunneling of the cephalic vein towards the axillary vein.  EBL: 100cc   Letoya Stallone C. Donzetta Matters, MD [facility] Office: [phone] Pager: [phone]

## 2022-03-28 NOTE — Anesthesia Procedure Notes (Signed)
Anesthesia Regional Block: Interscalene brachial plexus block   Pre-Anesthetic Checklist: , timeout performed,  Correct Patient, Correct Site, Correct Laterality,  Correct Procedure, Correct Position, site marked,  Risks and benefits discussed,  Surgical consent,  Pre-op evaluation,  At surgeon's request and post-op pain management  Laterality: Upper and Left  Prep: Maximum Sterile Barrier Precautions used, chloraprep       Needles:  Injection technique: Single-shot  Needle Type: Echogenic Needle     Needle Length: 5cm  Needle Gauge: 21     Additional Needles:   Procedures:,,,, ultrasound used (permanent image in chart),,    Narrative:  Start time: 03/28/2022 9:40 AM End time: 03/28/2022 9:45 AM Injection made incrementally with aspirations every 5 mL.  Performed by: Personally  Anesthesiologist: Barnet Glasgow, MD  Additional Notes: Block assessed prior to procedure. Patient tolerated procedure well.

## 2022-03-28 NOTE — Transfer of Care (Signed)
Immediate Anesthesia Transfer of Care Note  Patient: Margaret Aguilar  Procedure(s) Performed: LEFT ARM CEPHALIC VEIN FISTULA SUPERFICIALIZATION (Left: Arm Upper)  Patient Location: PACU  Anesthesia Type:MAC and Regional  Level of Consciousness: drowsy and patient cooperative  Airway & Oxygen Therapy: Patient Spontanous Breathing and Patient connected to face mask oxygen  Post-op Assessment: Report given to RN and Post -op Vital signs reviewed and stable  Post vital signs: Reviewed and stable  Last Vitals:  Vitals Value Taken Time  BP    Temp    Pulse    Resp    SpO2      Last Pain:  Vitals:   03/28/22 0825  TempSrc:   PainSc: 0-No pain         Complications: No notable events documented.

## 2022-03-28 NOTE — Anesthesia Postprocedure Evaluation (Signed)
Anesthesia Post Note  Patient: Margaret Aguilar  Procedure(s) Performed: LEFT ARM CEPHALIC VEIN FISTULA SUPERFICIALIZATION (Left: Arm Upper)     Patient location during evaluation: PACU Anesthesia Type: Regional Level of consciousness: awake and alert Pain management: pain level controlled Vital Signs Assessment: post-procedure vital signs reviewed and stable Respiratory status: spontaneous breathing, nonlabored ventilation, respiratory function stable and patient connected to nasal cannula oxygen Cardiovascular status: stable and blood pressure returned to baseline Postop Assessment: no apparent nausea or vomiting Anesthetic complications: no  No notable events documented.  Last Vitals:  Vitals:   03/28/22 1130 03/28/22 1145  BP: (!) 141/95 122/85  Pulse: (!) 51 (!) 48  Resp: 19 16  Temp:  (!) 36.2 C  SpO2: 95% 95%    Last Pain:  Vitals:   03/28/22 1145  TempSrc:   PainSc: 0-No pain                 Barnet Glasgow

## 2022-03-29 ENCOUNTER — Encounter (HOSPITAL_COMMUNITY): Payer: Self-pay | Admitting: Vascular Surgery

## 2022-04-05 DIAGNOSIS — G4733 Obstructive sleep apnea (adult) (pediatric): Secondary | ICD-10-CM | POA: Diagnosis not present

## 2022-04-09 DIAGNOSIS — M1990 Unspecified osteoarthritis, unspecified site: Secondary | ICD-10-CM | POA: Diagnosis not present

## 2022-04-09 DIAGNOSIS — G4733 Obstructive sleep apnea (adult) (pediatric): Secondary | ICD-10-CM | POA: Diagnosis not present

## 2022-04-09 DIAGNOSIS — Z9989 Dependence on other enabling machines and devices: Secondary | ICD-10-CM | POA: Diagnosis not present

## 2022-04-09 DIAGNOSIS — E1122 Type 2 diabetes mellitus with diabetic chronic kidney disease: Secondary | ICD-10-CM | POA: Diagnosis not present

## 2022-04-09 DIAGNOSIS — I120 Hypertensive chronic kidney disease with stage 5 chronic kidney disease or end stage renal disease: Secondary | ICD-10-CM | POA: Diagnosis not present

## 2022-04-09 DIAGNOSIS — Z6841 Body Mass Index (BMI) 40.0 and over, adult: Secondary | ICD-10-CM | POA: Diagnosis not present

## 2022-04-09 DIAGNOSIS — I770 Arteriovenous fistula, acquired: Secondary | ICD-10-CM | POA: Diagnosis not present

## 2022-04-09 DIAGNOSIS — J45909 Unspecified asthma, uncomplicated: Secondary | ICD-10-CM | POA: Diagnosis not present

## 2022-04-09 DIAGNOSIS — Z79899 Other long term (current) drug therapy: Secondary | ICD-10-CM | POA: Diagnosis not present

## 2022-04-09 DIAGNOSIS — N185 Chronic kidney disease, stage 5: Secondary | ICD-10-CM | POA: Diagnosis not present

## 2022-04-09 DIAGNOSIS — Z7985 Long-term (current) use of injectable non-insulin antidiabetic drugs: Secondary | ICD-10-CM | POA: Diagnosis not present

## 2022-04-09 DIAGNOSIS — Z7682 Awaiting organ transplant status: Secondary | ICD-10-CM | POA: Diagnosis not present

## 2022-04-09 DIAGNOSIS — E785 Hyperlipidemia, unspecified: Secondary | ICD-10-CM | POA: Diagnosis not present

## 2022-04-09 DIAGNOSIS — K219 Gastro-esophageal reflux disease without esophagitis: Secondary | ICD-10-CM | POA: Diagnosis not present

## 2022-04-09 DIAGNOSIS — Z01818 Encounter for other preprocedural examination: Secondary | ICD-10-CM | POA: Diagnosis not present

## 2022-04-10 ENCOUNTER — Other Ambulatory Visit: Payer: Medicaid Other | Admitting: Pharmacist

## 2022-04-30 ENCOUNTER — Ambulatory Visit: Payer: Self-pay | Admitting: Nurse Practitioner

## 2022-05-05 ENCOUNTER — Ambulatory Visit: Payer: Medicaid Other | Admitting: Clinical

## 2022-05-06 ENCOUNTER — Ambulatory Visit: Payer: Medicaid Other | Admitting: Clinical

## 2022-05-06 DIAGNOSIS — F3289 Other specified depressive episodes: Secondary | ICD-10-CM | POA: Diagnosis not present

## 2022-05-06 DIAGNOSIS — G4733 Obstructive sleep apnea (adult) (pediatric): Secondary | ICD-10-CM | POA: Diagnosis not present

## 2022-05-06 NOTE — BH Specialist Note (Signed)
Integrated Behavioral Health via Telemedicine Visit  05/06/2022 Margaret Aguilar [id number]  Number of [place] Clinician visits: 4- Fourth Visit  Session Start time: 1000   Session End time: 1100  Total time in minutes: 60  Referring Provider: Lazaro Arms, NP  Patient/Family location: [address], [place] (home) [facility] Provider location: Patient [place] All persons participating in visit: patient, CSW Types of Service: Individual psychotherapy and Telephone visit  I connected with [name] via Telephone and verified that I am speaking with the correct person using two identifiers. Discussed confidentiality: Yes   I discussed the limitations of telemedicine and the availability of in person appointments.  Discussed there is a possibility of technology failure and discussed alternative modes of communication if that failure occurs.  I discussed that engaging in this telemedicine visit, they consent to the provision of behavioral healthcare and the services will be billed under their insurance.  Patient and/or legal guardian expressed understanding and consented to Telemedicine visit: Yes   Presenting Concerns: Patient and/or family reports the following symptoms/concerns: depression, history of trauma Duration of problem: several years; Severity of problem: severe  Patient and/or Family's Strengths/Protective Factors: Social connections, Social and Emotional competence, Concrete supports in place (healthy food, safe environments, etc.), and Sense of purpose   Goals Addressed: Patient will:  Reduce symptoms of: anxiety and depression Increase knowledge and/or ability of: coping skills and self-management skills  Demonstrate ability to: Increase healthy adjustment to current life circumstances  Progress towards Goals: Ongoing  Interventions: Interventions utilized:  Supportive Counseling Standardized Assessments completed: Not  Needed  Supportive counseling today. Patient is undergoing the process for a possible kidney transplant, but depending on her kidney function progression, may need to start dialysis before transplant is available. Patient feeling ready to proceed, just fatigues from her condition. Processed some thoughts and emotions related to the transplant and also related to relationship with her spouse. Reflected on patient's development of healthy boundaries and communication strategies.  Patient and/or Family Response: Patient engaged in session.   Assessment: Patient currently experiencing depression related to health conditions, relationship dynamics, and history of trauma.   Patient may benefit from CBT to explore thoughts and emotions and development of coping skills for living with chronic illnesses.  Plan: Follow up with behavioral health clinician on: 05/21/22  I discussed the assessment and treatment plan with the patient and/or parent/guardian. They were provided an opportunity to ask questions and all were answered. They agreed with the plan and demonstrated an understanding of the instructions.   They were advised to call back or seek an in-person evaluation if the symptoms worsen or if the condition fails to improve as anticipated.  Estanislado Emms, LCSW

## 2022-05-17 IMAGING — CR DG TOE 3RD 2+V*R*
3 series · 3 of 3 positions shown · non-contrast
Comparison: None.

CLINICAL DATA: Pain

EXAM:
RIGHT THIRD TOE

[toe ap]
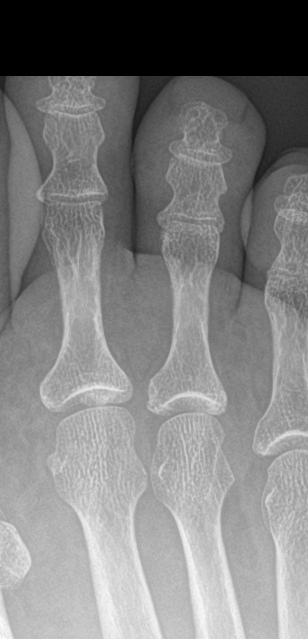

[toe obl]
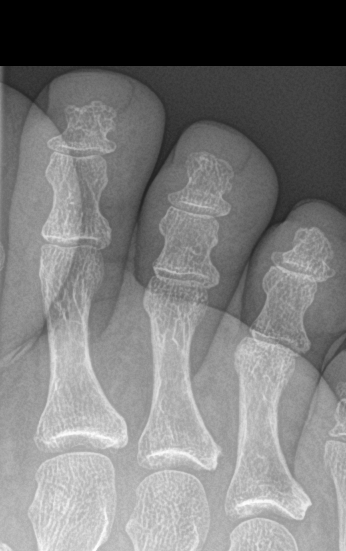

[toe lat]
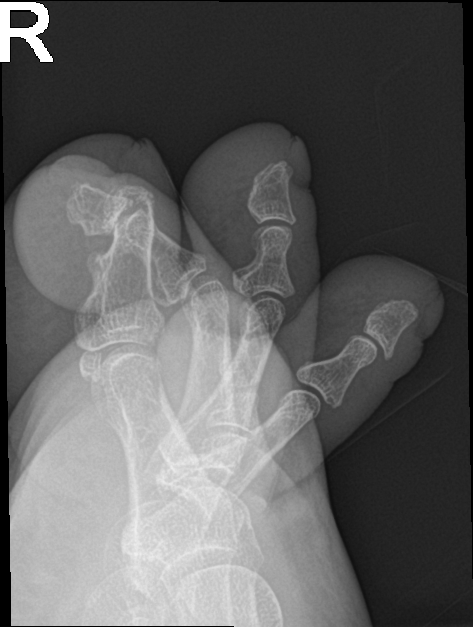

[3 of 3 positions shown; findings below may reference images not displayed]

FINDINGS: There is no evidence of fracture or dislocation. There is no
evidence of arthropathy or other focal bone abnormality. Soft
tissues are unremarkable.
IMPRESSION: Negative.

## 2022-05-19 DIAGNOSIS — Z723 Lack of physical exercise: Secondary | ICD-10-CM | POA: Diagnosis not present

## 2022-05-19 DIAGNOSIS — Z01810 Encounter for preprocedural cardiovascular examination: Secondary | ICD-10-CM | POA: Diagnosis not present

## 2022-05-19 DIAGNOSIS — I120 Hypertensive chronic kidney disease with stage 5 chronic kidney disease or end stage renal disease: Secondary | ICD-10-CM | POA: Diagnosis not present

## 2022-05-19 DIAGNOSIS — Z01818 Encounter for other preprocedural examination: Secondary | ICD-10-CM | POA: Diagnosis not present

## 2022-05-19 DIAGNOSIS — I517 Cardiomegaly: Secondary | ICD-10-CM | POA: Diagnosis not present

## 2022-05-19 DIAGNOSIS — E782 Mixed hyperlipidemia: Secondary | ICD-10-CM | POA: Diagnosis not present

## 2022-05-19 DIAGNOSIS — N185 Chronic kidney disease, stage 5: Secondary | ICD-10-CM | POA: Diagnosis not present

## 2022-05-19 DIAGNOSIS — G4733 Obstructive sleep apnea (adult) (pediatric): Secondary | ICD-10-CM | POA: Diagnosis not present

## 2022-05-19 DIAGNOSIS — E1122 Type 2 diabetes mellitus with diabetic chronic kidney disease: Secondary | ICD-10-CM | POA: Diagnosis not present

## 2022-05-19 DIAGNOSIS — Z7682 Awaiting organ transplant status: Secondary | ICD-10-CM | POA: Diagnosis not present

## 2022-05-21 ENCOUNTER — Ambulatory Visit (INDEPENDENT_AMBULATORY_CARE_PROVIDER_SITE_OTHER): Payer: Medicaid Other | Admitting: Clinical

## 2022-05-21 DIAGNOSIS — F3289 Other specified depressive episodes: Secondary | ICD-10-CM

## 2022-05-23 DIAGNOSIS — N189 Chronic kidney disease, unspecified: Secondary | ICD-10-CM | POA: Diagnosis not present

## 2022-05-23 DIAGNOSIS — I770 Arteriovenous fistula, acquired: Secondary | ICD-10-CM | POA: Diagnosis not present

## 2022-05-23 DIAGNOSIS — M109 Gout, unspecified: Secondary | ICD-10-CM | POA: Diagnosis not present

## 2022-05-23 DIAGNOSIS — N2581 Secondary hyperparathyroidism of renal origin: Secondary | ICD-10-CM | POA: Diagnosis not present

## 2022-05-23 DIAGNOSIS — D631 Anemia in chronic kidney disease: Secondary | ICD-10-CM | POA: Diagnosis not present

## 2022-05-23 DIAGNOSIS — N185 Chronic kidney disease, stage 5: Secondary | ICD-10-CM | POA: Diagnosis not present

## 2022-05-23 DIAGNOSIS — I120 Hypertensive chronic kidney disease with stage 5 chronic kidney disease or end stage renal disease: Secondary | ICD-10-CM | POA: Diagnosis not present

## 2022-05-23 DIAGNOSIS — E1129 Type 2 diabetes mellitus with other diabetic kidney complication: Secondary | ICD-10-CM | POA: Diagnosis not present

## 2022-05-23 NOTE — BH Specialist Note (Signed)
Integrated Behavioral Health via Telemedicine Visit  05/23/2022 Margaret Aguilar [id number]  Number of [place] Clinician visits: 5-Fifth Visit  Session Start time: 0905   Session End time: 1000  Total time in minutes: 55  Referring Provider: Lazaro Arms, NP  Patient/Family location: [address], [place] (home) [facility] Provider location: Patient [place] All persons participating in visit: patient, CSW Types of Service: Individual psychotherapy and Video visit  I connected with Margaret Aguilar a via Video Enabled Telemedicine Application (Video is Caregility application) and verified that I am speaking with the correct person using two identifiers. Discussed confidentiality: Yes   I discussed the limitations of telemedicine and the availability of in person appointments.  Discussed there is a possibility of technology failure and discussed alternative modes of communication if that failure occurs.  I discussed that engaging in this telemedicine visit, they consent to the provision of behavioral healthcare and the services will be billed under their insurance.  Patient and/or legal guardian expressed understanding and consented to Telemedicine visit: Yes   Presenting Concerns: Patient and/or family reports the following symptoms/concerns: depression, history of trauma Duration of problem: several years; Severity of problem: severe  Patient and/or Family's Strengths/Protective Factors: Social connections, Social and Emotional competence, Concrete supports in place (healthy food, safe environments, etc.), and Sense of purpose    Goals Addressed: Patient will:  Reduce symptoms of: anxiety and depression Increase knowledge and/or ability of: coping skills and self-management skills  Demonstrate ability to: Increase healthy adjustment to current life circumstances  Progress towards Goals: Ongoing  Interventions: Interventions utilized:  Supportive  Counseling Standardized Assessments completed: Not Needed  Supportive counseling around patient's relationship with spouse. She continues to experience challenging dynamics with him. Processed thoughts and emotions about some recent interactions. Emotional validation and reflective listening provided.   Patient and/or Family Response: Patient engaged in session.   Assessment: Patient currently experiencing depression related to health conditions, relationship dynamics, and history of trauma.   Patient may benefit from CBT to explore thoughts and emotions and development of coping skills for living with chronic illnesses.  Plan: Follow up with behavioral health clinician on: 06/04/22  I discussed the assessment and treatment plan with the patient and/or parent/guardian. They were provided an opportunity to ask questions and all were answered. They agreed with the plan and demonstrated an understanding of the instructions.   They were advised to call back or seek an in-person evaluation if the symptoms worsen or if the condition fails to improve as anticipated.  Estanislado Emms, LCSW

## 2022-05-30 ENCOUNTER — Ambulatory Visit: Payer: Medicaid Other | Admitting: Nurse Practitioner

## 2022-06-04 ENCOUNTER — Encounter: Payer: Medicaid Other | Admitting: Clinical

## 2022-06-06 ENCOUNTER — Encounter: Payer: Self-pay | Admitting: Nurse Practitioner

## 2022-06-06 ENCOUNTER — Ambulatory Visit: Payer: Medicaid Other | Admitting: Nurse Practitioner

## 2022-06-06 VITALS — BP 151/80 | HR 59 | Temp 97.1°F | Wt 221.4 lb

## 2022-06-06 DIAGNOSIS — Z13220 Encounter for screening for lipoid disorders: Secondary | ICD-10-CM | POA: Diagnosis not present

## 2022-06-06 DIAGNOSIS — Z131 Encounter for screening for diabetes mellitus: Secondary | ICD-10-CM

## 2022-06-06 DIAGNOSIS — I10 Essential (primary) hypertension: Secondary | ICD-10-CM | POA: Diagnosis not present

## 2022-06-06 DIAGNOSIS — G4733 Obstructive sleep apnea (adult) (pediatric): Secondary | ICD-10-CM | POA: Diagnosis not present

## 2022-06-06 MED ORDER — AMLODIPINE BESYLATE 10 MG PO TABS: 10.0000 mg | ORAL_TABLET | Freq: Every day | ORAL | 2 refills | Status: DC

## 2022-06-06 NOTE — Patient Instructions (Signed)
1. Essential hypertension  - CBC - Comprehensive metabolic panel - amLODipine (NORVASC) 10 MG tablet; Take 1 tablet (10 mg total) by mouth daily.  Dispense: 30 tablet; Refill: 2  2. Lipid screening  - Lipid Panel  3. Diabetes mellitus screening  - Hemoglobin A1c  Follow up:  Follow up in 6 months

## 2022-06-06 NOTE — Progress Notes (Signed)
$'@Patient'i$  ID: Margaret Aguilar, female    DOB: Jan 15, 1961, 62 y.o.   MRN: [id number]  Chief Complaint  Patient presents with   Follow-up    Referring provider: [name], NP   HPI  Margaret Aguilar presents for follow up. She has been lost to follow up. 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    Patient is taking medications as instructed, no medication side effects noted, no TIA's, no chest pain on exertion, no dyspnea on exertion, and no swelling of ankles. She has been diagnosed with Stage 5 CKD related to non adherence to treatment. 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 She has forms today for adoption. She reports having a fistula placed however no treatment has been started. She mentioned that her husband in going to donate her a kidney. The current prescribed treatment is furosemide 40 mg daily, metoprolol XL 50 mg  Bidil 20/37.5 mg BID and amlodipine 10 mg. Denies f/c/s, n/v/d, hemoptysis, PND, leg swelling Denies chest pain or edema     Allergies  Allergen Reactions   Nutritional Supplements     Walnut >> UNSPECIFIED REACTION  Severity from PMH   Latex Swelling and Rash    SWELLING REACTION UNSPECIFIED     Immunization History  Administered Date(s) Administered   Influenza,inj,Quad PF,6+ Mos 01/12/2018   Moderna Sars-Covid-2 Vaccination 12/12/2019   PPD Test 09/28/2013, 10/12/2013   Tdap 12/29/2019    Past Medical History:  Diagnosis Date   Anemia    Anxiety    Arthritis    Asthma    Back pain    Bradycardia     Depression    Diabetes ([place])    Diabetes mellitus without complication ([place])    x 5 yrs   Drug use    GERD (gastroesophageal reflux disease)    Gout    Herpes    High cholesterol    HLD (hyperlipidemia)    Hypertension    Joint pain    Kidney disease    Stage 4   Kidney disease, chronic, stage IV (severe, 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   albuterol (PROVENTIL) (2.5 MG/3ML) 0.083% nebulizer solution Take 3 mLs (2.5 mg total) by nebulization every 6 (six) hours as needed for wheezing or shortness of breath.   albuterol (VENTOLIN HFA) 108 (90 Base) MCG/ACT inhaler Inhale 2 puffs into the lungs every 6 (six) hours as needed for wheezing or shortness of breath.   allopurinol (ZYLOPRIM) 100 MG tablet Take 0.5 tablets (50 mg total) by mouth 2 (two) times a week.   calcitRIOL (ROCALTROL) 0.25 MCG capsule Take 0.25 mcg by mouth daily.  calcium carbonate (TUMS - DOSED IN MG ELEMENTAL CALCIUM) 500 MG chewable tablet Chew 2 tablets by mouth daily as needed for indigestion or heartburn.   carvedilol (COREG) 12.5 MG tablet Take 1 tablet (12.5 mg total) by mouth 2 (two) times daily with a meal. (Patient taking differently: Take 6.25 mg by mouth 2 (two) times daily with a meal.)   chlorhexidine (HIBICLENS) 4 % external liquid Apply topically daily as needed. (Patient taking differently: Apply 1 Application topically daily as needed (boil).)   colchicine 0.6 MG tablet Take 0.3 mg once, and then every 3 days until gout flare is resolved (Patient taking  differently: Take 0.3 mg by mouth See admin instructions. Take 0.3 mg once, and then every 3 days until gout flare is resolved as needed)   docusate sodium (COLACE) 100 MG capsule Take 1 capsule (100 mg total) by mouth 2 (two) times daily.   furosemide (LASIX) 40 MG tablet Take 1 tablet (40 mg total) by mouth daily.   montelukast (SINGULAIR) 10 MG tablet Take 1 tablet (10 mg total) by mouth at bedtime.   mupirocin ointment (BACTROBAN) 2 % Apply 1 Application topically 2 (two) times daily.   ondansetron (ZOFRAN) 4 MG tablet Take 1 tablet (4 mg total) by mouth every 8 (eight) hours as needed for nausea or vomiting.   oxyCODONE-acetaminophen (PERCOCET) 5-325 MG tablet Take 1 tablet by mouth every 6 (six) hours as needed for severe pain.   [DISCONTINUED] amLODipine (NORVASC) 10 MG tablet Take 1 tablet (10 mg total) by mouth daily.   amLODipine (NORVASC) 10 MG tablet Take 1 tablet (10 mg total) by mouth daily.   cephALEXin (KEFLEX) 500 MG capsule Take 1 capsule (500 mg total) by mouth 2 (two) times daily. (Patient not taking: Reported on 06/06/2022)   promethazine-dextromethorphan (PROMETHAZINE-DM) 6.25-15 MG/5ML syrup Take 5 mLs by mouth at bedtime as needed for cough. (Patient not taking: Reported on 06/06/2022)   Semaglutide,0.25 or 0.'5MG'$ /DOS, (OZEMPIC, 0.25 OR 0.5 MG/DOSE,) 2 MG/3ML SOPN Inject 0.25 mg weekly for 4 weeks, then increase to 0.5 mg weekly (Patient not taking: Reported on 06/06/2022)   No facility-administered encounter medications on file as of 06/06/2022.     Review of Systems  Review of Systems  Constitutional: Negative.   HENT: Negative.    Cardiovascular: Negative.   Gastrointestinal: Negative.   Allergic/Immunologic: Negative.   Neurological: Negative.   Psychiatric/Behavioral: Negative.         Physical Exam  BP (!) 151/80   Pulse (!) 59   Temp (!) 97.1 F (36.2 C)   Wt 221 lb 6.4 oz (100.4 kg)   LMP  (LMP Unknown)   SpO2 99%   BMI 41.83 kg/m   Wt Readings  from Last 5 Encounters:  06/06/22 221 lb 6.4 oz (100.4 kg)  03/28/22 216 lb (98 kg)  02/24/22 217 lb (98.4 kg)  02/18/22 219 lb (99.3 kg)  01/27/22 219 lb (99.3 kg)     Physical Exam Vitals and nursing note reviewed.  Constitutional:      General: She is not in acute distress.    Appearance: She is well-developed.  Cardiovascular:     Rate and Rhythm: Normal rate and regular rhythm.  Pulmonary:     Effort: Pulmonary effort is normal.     Breath sounds: Normal breath sounds.  Neurological:     Mental Status: She is alert and oriented to person, place, and time.      Lab Results:  CBC  Component Value Date/Time   WBC 5.6 01/22/2022 1100   RBC 4.40 01/22/2022 1100   HGB 12.2 03/28/2022 0820   HGB 11.9 11/15/2021 0921   HCT 36.0 03/28/2022 0820   HCT 36.3 11/15/2021 0921   PLT 201 01/22/2022 1100   PLT 246 11/15/2021 0921   MCV 84.3 01/22/2022 1100   MCV 80 11/15/2021 0921   MCH 25.7 (L) 01/22/2022 1100   MCHC 30.5 01/22/2022 1100   RDW 15.9 (H) 01/22/2022 1100   RDW 16.4 (H) 11/15/2021 0921   LYMPHSABS 1.4 01/22/2022 1100   LYMPHSABS 2.3 01/12/2018 1237   MONOABS 0.4 01/22/2022 1100   EOSABS 0.1 01/22/2022 1100   EOSABS 0.2 01/12/2018 1237   BASOSABS 0.0 01/22/2022 1100   BASOSABS 0.0 01/12/2018 1237    BMET    Component Value Date/Time   NA 141 03/28/2022 0820   NA 143 11/15/2021 0921   K 4.2 03/28/2022 0820   CL 111 03/28/2022 0820   CO2 19 (L) 01/22/2022 1100   GLUCOSE 108 (H) 03/28/2022 0820   BUN 72 (H) 03/28/2022 0820   BUN 67 (H) 11/15/2021 0921   CREATININE 6.60 (H) 03/28/2022 0820   CALCIUM 8.5 (L) 01/22/2022 1100   GFRNONAA 6 (L) 01/22/2022 1100   GFRAA 17 (L) 08/16/2018 1307      Assessment & Plan:   Essential hypertension - CBC - Comprehensive metabolic panel - amLODipine (NORVASC) 10 MG tablet; Take 1 tablet (10 mg total) by mouth daily.  Dispense: 30 tablet; Refill: 2  2. Lipid screening  - Lipid Panel  3. Diabetes  mellitus screening  - Hemoglobin A1c  Follow up:  Follow up in 6 months     [name], NP 06/06/2022

## 2022-06-06 NOTE — Assessment & Plan Note (Signed)
-   CBC - Comprehensive metabolic panel - amLODipine (NORVASC) 10 MG tablet; Take 1 tablet (10 mg total) by mouth daily.  Dispense: 30 tablet; Refill: 2  2. Lipid screening  - Lipid Panel  3. Diabetes mellitus screening  - Hemoglobin A1c  Follow up:  Follow up in 6 months

## 2022-06-07 LAB — COMPREHENSIVE METABOLIC PANEL
ALT: 5 IU/L (ref 0–32)
AST: 11 IU/L (ref 0–40)
Albumin/Globulin Ratio: 1.5 (ref 1.2–2.2)
Albumin: 4.2 g/dL (ref 3.8–4.9)
Alkaline Phosphatase: 93 IU/L (ref 44–121)
BUN/Creatinine Ratio: 11 (ref 9–23)
BUN: 68 mg/dL — ABNORMAL HIGH (ref 6–24)
Bilirubin Total: 0.3 mg/dL (ref 0.0–1.2)
CO2: 16 mmol/L — ABNORMAL LOW (ref 20–29)
Calcium: 8.6 mg/dL — ABNORMAL LOW (ref 8.7–10.2)
Chloride: 110 mmol/L — ABNORMAL HIGH (ref 96–106)
Creatinine, Ser: 6.42 mg/dL — ABNORMAL HIGH (ref 0.57–1.00)
Globulin, Total: 2.8 g/dL (ref 1.5–4.5)
Glucose: 84 mg/dL (ref 70–99)
Potassium: 4.3 mmol/L (ref 3.5–5.2)
Sodium: 145 mmol/L — ABNORMAL HIGH (ref 134–144)
Total Protein: 7 g/dL (ref 6.0–8.5)
eGFR: 7 mL/min/{1.73_m2} — ABNORMAL LOW (ref >59)

## 2022-06-07 LAB — CBC
Hematocrit: 35.7 % (ref 34.0–46.6)
Hemoglobin: 11.2 g/dL (ref 11.1–15.9)
MCH: 25.2 pg — ABNORMAL LOW (ref 26.6–33.0)
MCHC: 31.4 g/dL — ABNORMAL LOW (ref 31.5–35.7)
MCV: 80 fL (ref 79–97)
Platelets: 218 10*3/uL (ref 150–450)
RBC: 4.44 x10E6/uL (ref 3.77–5.28)
RDW: 15.5 % — ABNORMAL HIGH (ref 11.7–15.4)
WBC: 5.6 10*3/uL (ref 3.4–10.8)

## 2022-06-07 LAB — HEMOGLOBIN A1C
Est. average glucose Bld gHb Est-mCnc: 108 mg/dL
Hgb A1c MFr Bld: 5.4 % (ref 4.8–5.6)

## 2022-06-07 LAB — LIPID PANEL
Chol/HDL Ratio: 3.9 ratio (ref 0.0–4.4)
Cholesterol, Total: 185 mg/dL (ref 100–199)
HDL: 48 mg/dL (ref >39)
LDL Chol Calc (NIH): 121 mg/dL — ABNORMAL HIGH (ref 0–99)
Triglycerides: 86 mg/dL (ref 0–149)
VLDL Cholesterol Cal: 16 mg/dL (ref 5–40)

## 2022-06-23 ENCOUNTER — Other Ambulatory Visit: Payer: Self-pay | Admitting: Nephrology

## 2022-06-23 DIAGNOSIS — N281 Cyst of kidney, acquired: Secondary | ICD-10-CM

## 2022-06-24 ENCOUNTER — Other Ambulatory Visit: Payer: Self-pay | Admitting: Nurse Practitioner

## 2022-06-24 DIAGNOSIS — E1122 Type 2 diabetes mellitus with diabetic chronic kidney disease: Secondary | ICD-10-CM

## 2022-07-02 ENCOUNTER — Other Ambulatory Visit: Payer: Self-pay | Admitting: Nurse Practitioner

## 2022-07-02 DIAGNOSIS — Z1231 Encounter for screening mammogram for malignant neoplasm of breast: Secondary | ICD-10-CM

## 2022-07-04 ENCOUNTER — Telehealth: Payer: Self-pay

## 2022-07-04 NOTE — Telephone Encounter (Signed)
..   Medicaid Managed Care   Unsuccessful Outreach Note  07/04/2022 Name: Margaret Aguilar MRN: [id number] DOB: [birth year]  Referred by: [name], NP Reason for referral : Appointment (I called the patient today to get her scheduled with the MM Team. I left my name and number on her voicemail.)   An unsuccessful telephone outreach was attempted today. The patient was referred to the case management team for assistance with care management and care coordination.   Follow Up Plan: The care management team will reach out to the patient again over the next 14 days.   [place]  [phone]

## 2022-07-16 ENCOUNTER — Inpatient Hospital Stay: Admission: RE | Admit: 2022-07-16 | Payer: Medicaid Other | Source: Ambulatory Visit

## 2022-07-16 ENCOUNTER — Encounter: Payer: Self-pay | Admitting: Nephrology

## 2022-07-16 ENCOUNTER — Telehealth: Payer: Self-pay

## 2022-07-16 NOTE — Telephone Encounter (Signed)
-----   Message from Noralyn Pick, NP sent at 07/16/2022  1:49 PM EDT ----- Regarding: FW: cardiac clearance Hi Jan, since I ordered an official cardiac clearance at the time of her office visit 12/2021 I think we should still get an official cardiac clearance letter from the cardiologist who ended up doing her pre kidney transplant work up. She has multiple co-morbidities and CKD stage V, not yet on dialysis but still at risk for electrolyte abnormalities and arrhythmias therefore I recommended EGD or colonoscopies to be done at [facility]. I think it is best for Dr. Havery Moros to weigh in, as he previously supported cardiac clearance with procedures at [facility].   Jan, can you send her [place] cardiologist a request for cardiac clearance or shall I have Remo Lipps send it?   Her cardiac work up included: ECG STRESS No diagnostic ST segment changes were seen. Arrhythmia induced during stress: frequent PVC's. Couplets and triplets noted. Nondiagnostic stress ECG due to subtarget heart rate. ECG changes, blood pressure and heart rate responses during stress test are shown in the Cardiology Scan portion of this report in the EPIC. - REST ECHO Normal left ventricular function at rest. There were no segmental wall motion abnormalities at rest. The estimated LV ejection fraction is 55-60% . Left ventricular diastolic filling pattern:Indeterminate. Trivial pericardial effusion present. - STRESS ECHO Normal left ventricular function and global wall motion with stress. There were no segmental wall motion abnormalities post exercise. There was normal increase in global LV function post exercise. Nondiagnostic exercise echocardiography due to subtarget heart rate. -   ----- Message ----- From: Roetta Sessions, CMA Sent: 07/16/2022  11:14 AM EDT To: Noralyn Pick, NP Subject: cardiac clearance                              Hi there.  Hope you are doing well.  No rush - but I  have this patient, that you saw Sept 2023, on my Hospital list for Dr. Havery Moros.  We have been waiting on her to get cardiac clearance before we can schedule her.   I see that she had several cardiac tests 05-2022 in regards to her kidney transplant evaluation. When you have time (haha! right?) would you take a look at her chart?  Don't know if she is a candidate for ECL based on these recent evaluations or not. Please advise.  Thank you, Jan

## 2022-07-16 NOTE — Telephone Encounter (Signed)
[place] - Pre kidney transplant evaluation at [phone]. LM asking for advise regarding where to send request for Cardiac Clearance. Patient has had recent cardiac evaluation. Also sending letter to Dr. [name] (Cardiology) although his office indicates she hasn't been seen for a while.  Their fax is [phone].

## 2022-07-17 ENCOUNTER — Other Ambulatory Visit: Payer: Medicaid Other | Admitting: Obstetrics and Gynecology

## 2022-07-17 NOTE — Patient Outreach (Signed)
  Medicaid Managed Care   Unsuccessful Attempt Note   07/17/2022 Name: Margaret Aguilar MRN: [id number] DOB: 04-Aug-1960  Referred by: [name], NP Reason for referral : High Risk Managed Medicaid (Unsuccessful telephone outreach)  An unsuccessful telephone outreach was attempted today. The patient was referred to the case management team for assistance with care management and care coordination.    Follow Up Plan: The Managed Medicaid care management team will reach out to the patient again over the next 30 business  days. and The  Patient has been provided with contact information for the Managed Medicaid care management team and has been advised to call with any health related questions or concerns.   Aida Raider RN, BSN [place]  [facility] - Managed Medicaid High Risk [phone]

## 2022-07-17 NOTE — Patient Instructions (Signed)
Visit Information  Ms. Margaret Aguilar  - as a part of your Medicaid benefit, you are eligible for care management and care coordination services at no cost or copay. I was unable to reach you by phone today but would be happy to help you with your health related needs. Please feel free to call me at [phone].  A member of the Managed Medicaid care management team will reach out to you again over the next 30 business  days.   Aida Raider RN, BSN [place]  [facility] - Managed Medicaid High Risk [phone].

## 2022-07-21 ENCOUNTER — Encounter: Payer: Self-pay | Admitting: Nephrology

## 2022-07-22 NOTE — Telephone Encounter (Signed)
Called Dr. Annitta Jersey office. Patient has an appointment scheduled for Friday, April 12th. They will send [place] a response to the anti coag clearance request once she has been seen and evaluated.

## 2022-07-23 ENCOUNTER — Telehealth: Payer: Self-pay

## 2022-07-23 ENCOUNTER — Ambulatory Visit
Admission: RE | Admit: 2022-07-23 | Discharge: 2022-07-23 | Disposition: A | Discharge status: Home / Self Care | Payer: Medicaid Other | Source: Ambulatory Visit | Attending: Nephrology | Admitting: Nephrology

## 2022-07-23 DIAGNOSIS — K8020 Calculus of gallbladder without cholecystitis without obstruction: Secondary | ICD-10-CM | POA: Diagnosis not present

## 2022-07-23 DIAGNOSIS — N281 Cyst of kidney, acquired: Secondary | ICD-10-CM | POA: Diagnosis not present

## 2022-07-23 NOTE — Telephone Encounter (Signed)
..   Medicaid Managed Care   Unsuccessful Outreach Note  07/23/2022 Name: Margaret Aguilar MRN: [id number] DOB: June 02, 1960  Referred by: Ivonne Andrew, NP Reason for referral : Appointment   A second unsuccessful telephone outreach was attempted today. The patient was referred to the case management team for assistance with care management and care coordination.   Follow Up Plan: A HIPAA compliant phone message was left for the patient providing contact information and requesting a return call.  The care management team will reach out to the patient again over the next 7 days.   Weston Settle Care Guide  [facility] Managed  [facility]  [phone]

## 2022-07-28 NOTE — Telephone Encounter (Signed)
Called Dr. Annitta Jersey office - patient no showed her cardiology appointment again on Friday, 4-12.

## 2022-07-29 NOTE — Telephone Encounter (Signed)
Thanks Jan. She has not been seen in the office since September. Needs cardiac workup prior to proceeding. If she wishes to pursue care for her GI issues at this point she will need to follow up with me in the office for reassessment and to review everything. Can you see if she is willing to come back to the office for reassessment? Thanks

## 2022-07-29 NOTE — Telephone Encounter (Signed)
MyChart message sent to patient to call to schedule an appt

## 2022-08-01 ENCOUNTER — Other Ambulatory Visit: Payer: Self-pay | Admitting: Nurse Practitioner

## 2022-08-01 ENCOUNTER — Other Ambulatory Visit: Payer: Self-pay | Admitting: Family Medicine

## 2022-08-01 DIAGNOSIS — I10 Essential (primary) hypertension: Secondary | ICD-10-CM

## 2022-08-11 DIAGNOSIS — I493 Ventricular premature depolarization: Secondary | ICD-10-CM | POA: Diagnosis not present

## 2022-08-11 DIAGNOSIS — N186 End stage renal disease: Secondary | ICD-10-CM | POA: Diagnosis not present

## 2022-08-11 DIAGNOSIS — E782 Mixed hyperlipidemia: Secondary | ICD-10-CM | POA: Diagnosis not present

## 2022-08-11 DIAGNOSIS — G4733 Obstructive sleep apnea (adult) (pediatric): Secondary | ICD-10-CM | POA: Diagnosis not present

## 2022-08-11 DIAGNOSIS — Z01810 Encounter for preprocedural cardiovascular examination: Secondary | ICD-10-CM | POA: Diagnosis not present

## 2022-08-11 DIAGNOSIS — I249 Acute ischemic heart disease, unspecified: Secondary | ICD-10-CM | POA: Diagnosis not present

## 2022-08-11 DIAGNOSIS — E669 Obesity, unspecified: Secondary | ICD-10-CM | POA: Diagnosis not present

## 2022-08-11 DIAGNOSIS — Z992 Dependence on renal dialysis: Secondary | ICD-10-CM | POA: Diagnosis not present

## 2022-08-11 DIAGNOSIS — E1122 Type 2 diabetes mellitus with diabetic chronic kidney disease: Secondary | ICD-10-CM | POA: Diagnosis not present

## 2022-08-11 DIAGNOSIS — I517 Cardiomegaly: Secondary | ICD-10-CM | POA: Diagnosis not present

## 2022-08-11 DIAGNOSIS — Z01818 Encounter for other preprocedural examination: Secondary | ICD-10-CM | POA: Diagnosis not present

## 2022-08-11 DIAGNOSIS — E1169 Type 2 diabetes mellitus with other specified complication: Secondary | ICD-10-CM | POA: Diagnosis not present

## 2022-08-11 DIAGNOSIS — R001 Bradycardia, unspecified: Secondary | ICD-10-CM | POA: Diagnosis not present

## 2022-08-11 DIAGNOSIS — I120 Hypertensive chronic kidney disease with stage 5 chronic kidney disease or end stage renal disease: Secondary | ICD-10-CM | POA: Diagnosis not present

## 2022-08-12 ENCOUNTER — Other Ambulatory Visit: Payer: Medicaid Other | Admitting: *Deleted

## 2022-08-12 NOTE — Patient Outreach (Signed)
  Medicaid Managed Care   Unsuccessful Attempt Note   08/12/2022 Name: Margaret Aguilar MRN: [id number] DOB: 01-19-1961  Referred by: Ivonne Andrew, NP Reason for referral : High Risk Managed Medicaid (Unsuccessful RNCM initial outreach)   Third unsuccessful telephone outreach was attempted today. The patient was referred to the case management team for assistance with care management and care coordination. The patient's primary care provider has been notified of our unsuccessful attempts to make or maintain contact with the patient. The care management team is pleased to engage with this patient at any time in the future should he/she be interested in assistance from the care management team.    Follow Up Plan: The Managed Medicaid care management team is available to follow up with the patient after provider conversation with the patient regarding recommendation for care management engagement and subsequent re-referral to the care management team.     Estanislado Emms RN, BSN [place]  Managed [facility] RN Care Coordinator [phone]

## 2022-09-02 ENCOUNTER — Ambulatory Visit: Payer: Medicaid Other | Admitting: Clinical

## 2022-09-04 ENCOUNTER — Other Ambulatory Visit: Payer: Self-pay

## 2022-09-04 DIAGNOSIS — K5900 Constipation, unspecified: Secondary | ICD-10-CM

## 2022-09-04 DIAGNOSIS — R112 Nausea with vomiting, unspecified: Secondary | ICD-10-CM

## 2022-09-04 DIAGNOSIS — I10 Essential (primary) hypertension: Secondary | ICD-10-CM

## 2022-09-04 MED ORDER — ALBUTEROL SULFATE HFA 108 (90 BASE) MCG/ACT IN AERS: 2.0000 | INHALATION_SPRAY | Freq: Four times a day (QID) | RESPIRATORY_TRACT | 0 refills | Status: AC | PRN

## 2022-09-04 MED ORDER — CALCITRIOL 0.25 MCG PO CAPS: 0.2500 ug | ORAL_CAPSULE | Freq: Every day | ORAL | 2 refills | Status: AC

## 2022-09-04 MED ORDER — FUROSEMIDE 40 MG PO TABS
40.0000 mg | ORAL_TABLET | Freq: Every day | ORAL | 2 refills | Status: DC
Start: 2022-09-04 — End: 2023-03-31

## 2022-09-04 MED ORDER — ACETAMINOPHEN 500 MG PO TABS: 1000.0000 mg | ORAL_TABLET | Freq: Four times a day (QID) | ORAL | 2 refills | Status: AC | PRN

## 2022-09-04 MED ORDER — DOCUSATE SODIUM 100 MG PO CAPS: 100.0000 mg | ORAL_CAPSULE | Freq: Two times a day (BID) | ORAL | 0 refills | Status: DC

## 2022-09-04 MED ORDER — CALCIUM CARBONATE ANTACID 500 MG PO CHEW: 2.0000 | CHEWABLE_TABLET | Freq: Every day | ORAL | 2 refills | Status: AC | PRN

## 2022-09-04 MED ORDER — ONDANSETRON HCL 4 MG PO TABS
4.0000 mg | ORAL_TABLET | Freq: Three times a day (TID) | ORAL | 0 refills | Status: AC | PRN
Start: 2022-09-04 — End: ?

## 2022-09-04 NOTE — Telephone Encounter (Signed)
Please advise K[facility] 

## 2022-09-04 NOTE — Telephone Encounter (Signed)
From: [name] To: Office of Ivonne Andrew, NP Sent: 09/03/2022 10:10 PM EDT Subject: Medication Renewal Request  Refills have been requested for the following medications:   calcitRIOL (ROCALTROL) 0.25 MCG capsule   docusate sodium (COLACE) 100 MG capsule [Tonya S [place]]   ondansetron (ZOFRAN) 4 MG tablet [Tonya S [place]]   furosemide (LASIX) 40 MG tablet [Tonya S [place]]   albuterol (VENTOLIN HFA) 108 (90 Base) MCG/ACT inhaler Archie Patten S [place]]   acetaminophen (TYLENOL) 500 MG tablet   calcium carbonate (TUMS - DOSED IN MG ELEMENTAL CALCIUM) 500 MG chewable tablet  Preferred pharmacy: [id number] [place], [place] - [address] Delivery method: Daryll Drown

## 2022-10-07 ENCOUNTER — Ambulatory Visit
Admission: RE | Admit: 2022-10-07 | Discharge: 2022-10-07 | Disposition: A | Discharge status: Home / Self Care | Payer: Medicaid Other | Source: Ambulatory Visit | Attending: Nurse Practitioner | Admitting: Nurse Practitioner

## 2022-10-07 DIAGNOSIS — Z1231 Encounter for screening mammogram for malignant neoplasm of breast: Secondary | ICD-10-CM

## 2022-10-22 ENCOUNTER — Ambulatory Visit (INDEPENDENT_AMBULATORY_CARE_PROVIDER_SITE_OTHER): Payer: Medicaid Other | Admitting: Clinical

## 2022-10-22 DIAGNOSIS — F3289 Other specified depressive episodes: Secondary | ICD-10-CM | POA: Diagnosis not present
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
## 2022-10-27 ENCOUNTER — Other Ambulatory Visit: Payer: Self-pay | Admitting: Nurse Practitioner

## 2022-10-27 DIAGNOSIS — I10 Essential (primary) hypertension: Secondary | ICD-10-CM

## 2022-11-06 ENCOUNTER — Ambulatory Visit: Payer: Medicaid Other | Admitting: Clinical

## 2022-11-06 DIAGNOSIS — F32A Depression, unspecified: Secondary | ICD-10-CM | POA: Diagnosis not present

## 2022-11-06 DIAGNOSIS — F3289 Other specified depressive episodes: Secondary | ICD-10-CM
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
## 2022-11-20 ENCOUNTER — Ambulatory Visit: Payer: Medicaid Other | Admitting: Clinical

## 2022-11-20 DIAGNOSIS — F3289 Other specified depressive episodes: Secondary | ICD-10-CM

## 2022-11-20 NOTE — BH Specialist Note (Signed)
No billable visit today. Power at our office was out and patient's phone was also low on charge. Patient joined virtual visit so it couldn't be cancelled/rescheduled, but she will call to reschedule.  Abigail Butts, LCSW [facility] [phone]

## 2022-12-03 DIAGNOSIS — N281 Cyst of kidney, acquired: Secondary | ICD-10-CM | POA: Diagnosis not present

## 2022-12-03 DIAGNOSIS — E785 Hyperlipidemia, unspecified: Secondary | ICD-10-CM | POA: Diagnosis not present

## 2022-12-03 DIAGNOSIS — I120 Hypertensive chronic kidney disease with stage 5 chronic kidney disease or end stage renal disease: Secondary | ICD-10-CM | POA: Diagnosis not present

## 2022-12-03 DIAGNOSIS — N2581 Secondary hyperparathyroidism of renal origin: Secondary | ICD-10-CM | POA: Diagnosis not present

## 2022-12-03 DIAGNOSIS — I770 Arteriovenous fistula, acquired: Secondary | ICD-10-CM | POA: Diagnosis not present

## 2022-12-03 DIAGNOSIS — N185 Chronic kidney disease, stage 5: Secondary | ICD-10-CM | POA: Diagnosis not present

## 2022-12-03 DIAGNOSIS — M109 Gout, unspecified: Secondary | ICD-10-CM | POA: Diagnosis not present

## 2022-12-03 DIAGNOSIS — E1129 Type 2 diabetes mellitus with other diabetic kidney complication: Secondary | ICD-10-CM | POA: Diagnosis not present

## 2022-12-03 DIAGNOSIS — D631 Anemia in chronic kidney disease: Secondary | ICD-10-CM | POA: Diagnosis not present

## 2022-12-03 DIAGNOSIS — N189 Chronic kidney disease, unspecified: Secondary | ICD-10-CM | POA: Diagnosis not present

## 2022-12-03 DIAGNOSIS — Z6841 Body Mass Index (BMI) 40.0 and over, adult: Secondary | ICD-10-CM | POA: Diagnosis not present

## 2022-12-05 ENCOUNTER — Ambulatory Visit: Payer: Self-pay | Admitting: Nurse Practitioner

## 2022-12-22 ENCOUNTER — Ambulatory Visit: Payer: Self-pay | Admitting: Nurse Practitioner

## 2022-12-30 ENCOUNTER — Encounter: Payer: Self-pay | Admitting: Family Medicine

## 2022-12-30 ENCOUNTER — Ambulatory Visit: Payer: Medicaid Other | Admitting: Family Medicine

## 2022-12-30 VITALS — BP 166/100 | HR 64 | Temp 97.8°F | Ht 61.0 in | Wt 204.0 lb

## 2022-12-30 DIAGNOSIS — E1122 Type 2 diabetes mellitus with diabetic chronic kidney disease: Secondary | ICD-10-CM | POA: Diagnosis not present

## 2022-12-30 DIAGNOSIS — I499 Cardiac arrhythmia, unspecified: Secondary | ICD-10-CM | POA: Diagnosis not present

## 2022-12-30 DIAGNOSIS — I10 Essential (primary) hypertension: Secondary | ICD-10-CM | POA: Diagnosis not present

## 2022-12-30 DIAGNOSIS — E559 Vitamin D deficiency, unspecified: Secondary | ICD-10-CM | POA: Diagnosis not present

## 2022-12-30 DIAGNOSIS — Z7985 Long-term (current) use of injectable non-insulin antidiabetic drugs: Secondary | ICD-10-CM | POA: Diagnosis not present

## 2022-12-30 DIAGNOSIS — Z1159 Encounter for screening for other viral diseases: Secondary | ICD-10-CM

## 2022-12-30 DIAGNOSIS — Z23 Encounter for immunization: Secondary | ICD-10-CM | POA: Diagnosis not present

## 2022-12-30 DIAGNOSIS — N184 Chronic kidney disease, stage 4 (severe): Secondary | ICD-10-CM | POA: Diagnosis not present

## 2022-12-30 NOTE — Assessment & Plan Note (Addendum)
Chronic uncontrolled. Has not taken her medication today. Counseled on importance of medication compliance and risks of uncontrolled HTN, especially in the setting of her CKD. She is currently taking Amlodipine 10mg  daily and Isosorbide-hydralazine 20-37.5 BID. Was previously taking Coreg 6.25/12.5mg  (unclear which) BID. She is monitoring her BP at home every other day and readings are 200/100s.  Recommend heart healthy diet such as Mediterranean diet with whole grains, fruits, vegetable, fish, lean meats, nuts, and olive oil. Limit salt. Encouraged moderate walking, 3-5 times/week for 30-50 minutes each session. Aim for at least 150 minutes.week. Goal should be pace of 3 miles/hours, or walking 1.5 miles in 30 minutes. Avoid tobacco products. Avoid excess alcohol. Take medications as prescribed and bring medications and blood pressure log with cuff to each office visit. Seek medical care for chest pain, palpitations, shortness of breath with exertion, dizziness/lightheadedness, vision changes, recurrent headaches, or swelling of extremities. Return to  my office in 1 week with BP readings and your home cuff and take medications as prescribed. Encouraged to follow up with current cardiologist as well

## 2022-12-30 NOTE — Assessment & Plan Note (Signed)
Chronic controlled. Unmedicated. Last A1c 5.6%. Foot exam done today. A1c ordered. Will obtain fasting labs.

## 2022-12-30 NOTE — Assessment & Plan Note (Signed)
Followed by Nephrology, awaiting transplant.

## 2022-12-30 NOTE — Progress Notes (Signed)
New Patient Office Visit  Subjective    Patient ID: Margaret Aguilar, female    DOB: 07-May-1960  Age: 62 y.o. MRN: [id number]  CC: No chief complaint on file.   HPI Margaret Aguilar presents to establish care. Oriented to practice routines and expectations. PMH as below. She has been seeing another PCP. She does see [facility], Behavioral [facility], [facility] Cardiology with [facility], and [facility] (is due for colonoscopy but awaiting eval for a mass surrounding her kidney?). She is on a list for Kidney Transplant.  Breast CA screening: Mammogram status: Completed 10/07/22. Repeat every year Cervical CA screening: patient does not recall when last pap was, normal Colon CA screening: colonoscopy 9 years ago with abnormalities. Repeat q5y. Tobacco: marijuana every other day for pain Vaccines:  shingles today, declines flu  HYPERTENSION with Chronic Kidney Disease Hypertension status: uncontrolled  Satisfied with current treatment? yes Duration of hypertension: chronic BP monitoring frequency:  a few times a week BP range: 200/101 BP medication side effects:  no Medication compliance: poor compliance, forget 1-2x weekly Previous BP meds:amlodipine and HCTZ, isosorbide Aspirin: no Recurrent headaches: no Visual changes: no Palpitations: no Dyspnea: no Chest pain: no Lower extremity edema: no Dizzy/lightheaded: no  DIABETES- last A1c 5.6% Hypoglycemic episodes:no Polydipsia/polyuria: no Visual disturbance: no Chest pain: no Paresthesias: no Glucose Monitoring: no  Accucheck frequency: Not Checking  Fasting glucose:  Post prandial:  Evening:  Before meals: Taking Insulin?: no  Long acting insulin:  Short acting insulin: Blood Pressure Monitoring: a few times a week Retinal Examination: Up to Date Foot Exam:  today Diabetic Education: Completed Pneumovax: Up to Date Influenza:  declines Aspirin: no   Outpatient Encounter Medications as of  12/30/2022  Medication Sig   acetaminophen (TYLENOL) 500 MG tablet Take 2 tablets (1,000 mg total) by mouth every 6 (six) hours as needed for moderate pain.   albuterol (PROVENTIL) (2.5 MG/3ML) 0.083% nebulizer solution Take 3 mLs (2.5 mg total) by nebulization every 6 (six) hours as needed for wheezing or shortness of breath.   albuterol (VENTOLIN HFA) 108 (90 Base) MCG/ACT inhaler Inhale 2 puffs into the lungs every 6 (six) hours as needed for wheezing or shortness of breath.   allopurinol (ZYLOPRIM) 100 MG tablet Take 0.5 tablets (50 mg total) by mouth 2 (two) times a week.   calcitRIOL (ROCALTROL) 0.25 MCG capsule Take 1 capsule (0.25 mcg total) by mouth daily.   calcium carbonate (TUMS - DOSED IN MG ELEMENTAL CALCIUM) 500 MG chewable tablet Chew 2 tablets (400 mg of elemental calcium total) by mouth daily as needed for indigestion or heartburn.   carvedilol (COREG) 12.5 MG tablet Take 1 tablet (12.5 mg total) by mouth 2 (two) times daily with a meal. (Patient taking differently: Take 6.25 mg by mouth 2 (two) times daily with a meal.)   chlorhexidine (HIBICLENS) 4 % external liquid Apply topically daily as needed. (Patient taking differently: Apply 1 Application topically daily as needed (boil).)   colchicine 0.6 MG tablet Take 0.3 mg once, and then every 3 days until gout flare is resolved (Patient taking differently: Take 0.3 mg by mouth See admin instructions. Take 0.3 mg once, and then every 3 days until gout flare is resolved as needed)   docusate sodium (COLACE) 100 MG capsule Take 1 capsule (100 mg total) by mouth 2 (two) times daily.   mupirocin ointment (BACTROBAN) 2 % Apply 1 Application topically 2 (two) times daily.   ondansetron (ZOFRAN) 4 MG tablet  Take 1 tablet (4 mg total) by mouth every 8 (eight) hours as needed for nausea or vomiting.   oxyCODONE-acetaminophen (PERCOCET) 5-325 MG tablet Take 1 tablet by mouth every 6 (six) hours as needed for severe pain.    promethazine-dextromethorphan (PROMETHAZINE-DM) 6.25-15 MG/5ML syrup Take 5 mLs by mouth at bedtime as needed for cough.   Semaglutide,0.25 or 0.5MG /DOS, (OZEMPIC, 0.25 OR 0.5 MG/DOSE,) 2 MG/3ML SOPN INJECT 0.25 MG WEEKLY FOR 4 WEEKS, THEN INCREASE TO 0.5 MG WEEKLY   amLODipine (NORVASC) 10 MG tablet Take 1 tablet (10 mg total) by mouth daily.   furosemide (LASIX) 40 MG tablet Take 1 tablet (40 mg total) by mouth daily.   montelukast (SINGULAIR) 10 MG tablet Take 1 tablet (10 mg total) by mouth at bedtime.   [DISCONTINUED] cephALEXin (KEFLEX) 500 MG capsule Take 1 capsule (500 mg total) by mouth 2 (two) times daily. (Patient not taking: Reported on 12/30/2022)   No facility-administered encounter medications on file as of 12/30/2022.    Past Medical History:  Diagnosis Date   Anemia    Anxiety    Arthritis    Asthma    Back pain    Bradycardia    Depression    Diabetes (HCC)    Diabetes mellitus without complication (HCC)    Drug use    GERD (gastroesophageal reflux disease)    Gout    Herpes    High cholesterol    HLD (hyperlipidemia)    Hypertension    Joint pain    Kidney disease    Stage 4   Kidney disease, chronic, stage IV (severe, EGFR 15-29 ml/min) (HCC)    Palpitations    Pneumonia    several   Sleep apnea    does not tolerate CPAP   SOB (shortness of breath)    Vitamin D-dependent rickets     Past Surgical History:  Procedure Laterality Date   A/V FISTULAGRAM Left 02/24/2022   Procedure: A/V Fistulagram;  Surgeon: Maeola Harman, MD;  Location: [facility] INVASIVE CV LAB;  Service: Cardiovascular;  Laterality: Left;   AV FISTULA PLACEMENT Left 10/19/2018   Procedure: LEFT ARTERIOVENOUS (AV) FISTULA CREATION;  Surgeon: Maeola Harman, MD;  Location: [facility] OR;  Service: Vascular;  Laterality: Left;   BASCILIC VEIN TRANSPOSITION Left 06/30/2019   Procedure: Bascilic Vein Transposition;  Surgeon: Maeola Harman, MD;  Location: [facility] OR;  Service:  Vascular;  Laterality: Left;   CESAREAN SECTION     x 1   COLONOSCOPY N/A 01/12/2014   Procedure: COLONOSCOPY;  Surgeon: Malissa Hippo, MD;  [facility];  Service: Endoscopy;  Laterality: N/A;  225   D&C     FISTULA SUPERFICIALIZATION Left 03/28/2022   Procedure: LEFT ARM CEPHALIC VEIN FISTULA SUPERFICIALIZATION;  Surgeon: Maeola Harman, MD;  Location: [facility] OR;  Service: Vascular;  Laterality: Left;   TUBAL LIGATION      Family History  Problem Relation Age of Onset   Diabetes Mother    Hyperlipidemia Mother    Depression Mother    Anxiety disorder Mother    Sleep apnea Mother    Eating disorder Mother    Obesity Mother    Hyperlipidemia Father    Hypertension Father    Heart disease Father    Sleep apnea Father    Alcoholism Father    Drug abuse Father    Sleep apnea Sister    Stroke Brother    Diabetes Brother    Breast cancer Neg  Hx     Social History   Socioeconomic History   Marital status: Married    Spouse name: Dimas Aguas   Number of children: Not on file   Years of education: Not on file   Highest education level: Not on file  Occupational History   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  Tobacco Use   Smoking status: Never   Smokeless tobacco: Never  Vaping Use   Vaping status: Never Used  Substance and Sexual Activity   Alcohol use: No   Drug use: Yes    Types: Marijuana   Sexual activity: Not Currently    Birth control/protection: Post-menopausal  Other Topics Concern   Not on file  Social History Narrative   Not on file   Social Determinants of Health   Financial Resource Strain: Medium Risk (02/28/2020)   Overall Financial Resource Strain (CARDIA)    Difficulty of Paying Living Expenses: Somewhat hard  Food Insecurity: No Food Insecurity (12/12/2019)   Hunger Vital Sign    Worried About Running Out of Food in the Last Year: Never true    Ran Out of Food in the Last Year: Never true  Transportation Needs: No  Transportation Needs (12/15/2019)   PRAPARE - Administrator, Civil Service (Medical): No    Lack of Transportation (Non-Medical): No  Physical Activity: Sufficiently Active (01/12/2020)   Exercise Vital Sign    Days of Exercise per Week: 4 days    Minutes of Exercise per Session: 60 min  Stress: No Stress Concern Present (02/28/2020)   [employer] of Occupational Health - Occupational Stress Questionnaire    Feeling of Stress : Not at all  Recent Concern: Stress - Stress Concern Present (01/12/2020)   [employer] of Occupational Health - Occupational Stress Questionnaire    Feeling of Stress : Rather much  Social Connections: Moderately Integrated (01/12/2020)   Social Connection and Isolation Panel [NHANES]    Frequency of Communication with Friends and Family: Three times a week    Frequency of Social Gatherings with Friends and Family: Three times a week    Attends Religious Services: More than 4 times per year    Active Member of Clubs or Organizations: No    Attends Banker Meetings: Never    Marital Status: Married  Catering manager Violence: Not At Risk (01/12/2020)   Humiliation, Afraid, Rape, and Kick questionnaire    Fear of Current or Ex-Partner: No    Emotionally Abused: No    Physically Abused: No    Sexually Abused: No    Review of Systems  All other systems reviewed and are negative.       Objective    BP (!) 166/100   Pulse 64   Temp 97.8 F (36.6 C) (Oral)   Ht 5\' 1"  (1.549 m)   Wt 204 lb (92.5 kg)   LMP  (LMP Unknown)   SpO2 99%   BMI 38.55 kg/m     12/30/2022    3:06 PM 12/30/2022    2:59 PM 06/06/2022   11:45 AM  Vitals with BMI  Height  5\' 1"    Weight  204 lbs   BMI  38.57   Systolic 166 180 401  Diastolic 100 100 80  Pulse  64       Physical Exam Vitals and nursing note reviewed.  Constitutional:      Appearance: Normal appearance. She is normal weight.  HENT:     Head:  Normocephalic and  atraumatic.  Cardiovascular:     Rate and Rhythm: Normal rate and regular rhythm.     Pulses: Normal pulses.     Heart sounds: Normal heart sounds.  Pulmonary:     Effort: Pulmonary effort is normal.     Breath sounds: Normal breath sounds.  Skin:    General: Skin is warm and dry.  Neurological:     General: No focal deficit present.     Mental Status: She is alert and oriented to person, place, and time. Mental status is at baseline.  Psychiatric:        Mood and Affect: Mood normal.        Behavior: Behavior normal.        Thought Content: Thought content normal.        Judgment: Judgment normal.         Assessment & Plan:   Problem List Items Addressed This Visit     Hypertension - Primary    Chronic uncontrolled. Has not taken her medication today. Counseled on importance of medication compliance and risks of uncontrolled HTN, especially in the setting of her CKD. She is currently taking Amlodipine 10mg  daily and Isosorbide-hydralazine 20-37.5 BID. Was previously taking Coreg 6.25/12.5mg  (unclear which) BID. She is monitoring her BP at home every other day and readings are 200/100s.  Recommend heart healthy diet such as Mediterranean diet with whole grains, fruits, vegetable, fish, lean meats, nuts, and olive oil. Limit salt. Encouraged moderate walking, 3-5 times/week for 30-50 minutes each session. Aim for at least 150 minutes.week. Goal should be pace of 3 miles/hours, or walking 1.5 miles in 30 minutes. Avoid tobacco products. Avoid excess alcohol. Take medications as prescribed and bring medications and blood pressure log with cuff to each office visit. Seek medical care for chest pain, palpitations, shortness of breath with exertion, dizziness/lightheadedness, vision changes, recurrent headaches, or swelling of extremities. Return to  my office in 1 week with BP readings and your home cuff and take medications as prescribed. Encouraged to follow up with current cardiologist  as well       Relevant Orders   CBC with Differential/Platelet   COMPLETE METABOLIC PANEL WITH GFR   Lipid panel   Type 2 diabetes mellitus, without long-term current use of insulin (HCC)    Chronic controlled. Unmedicated. Last A1c 5.6%. Foot exam done today. A1c ordered. Will obtain fasting labs.      Relevant Orders   Hemoglobin A1c   CKD (chronic kidney disease) stage 4, GFR 15-29 ml/min (HCC)    Followed by Nephrology, awaiting transplant.      Vitamin D deficiency   Relevant Orders   VITAMIN D 25 Hydroxy (Vit-D Deficiency, Fractures)   Irregular heart rate    EKG NSR      Relevant Orders   EKG 12-Lead (Completed)   Other Visit Diagnoses     Need for hepatitis C screening test       Relevant Orders   Hepatitis C antibody       Return in about 1 week (around 01/06/2023) for hypertension.   Park Meo, FNP

## 2022-12-30 NOTE — Assessment & Plan Note (Signed)
EKG NSR

## 2022-12-31 ENCOUNTER — Other Ambulatory Visit: Payer: Medicaid Other

## 2022-12-31 ENCOUNTER — Ambulatory Visit: Payer: Self-pay | Admitting: Nurse Practitioner

## 2022-12-31 DIAGNOSIS — E559 Vitamin D deficiency, unspecified: Secondary | ICD-10-CM | POA: Diagnosis not present

## 2022-12-31 DIAGNOSIS — N184 Chronic kidney disease, stage 4 (severe): Secondary | ICD-10-CM | POA: Diagnosis not present

## 2022-12-31 DIAGNOSIS — E1122 Type 2 diabetes mellitus with diabetic chronic kidney disease: Secondary | ICD-10-CM | POA: Diagnosis not present

## 2022-12-31 DIAGNOSIS — I10 Essential (primary) hypertension: Secondary | ICD-10-CM | POA: Diagnosis not present

## 2022-12-31 DIAGNOSIS — Z1159 Encounter for screening for other viral diseases: Secondary | ICD-10-CM | POA: Diagnosis not present

## 2023-01-01 LAB — COMPLETE METABOLIC PANEL WITH GFR
AG Ratio: 1.6 (calc) (ref 1.0–2.5)
ALT: 4 U/L — ABNORMAL LOW (ref 6–29)
AST: 12 U/L (ref 10–35)
Albumin: 4 g/dL (ref 3.6–5.1)
Alkaline phosphatase (APISO): 88 U/L (ref 37–153)
BUN/Creatinine Ratio: 9 (calc) (ref 6–22)
BUN: 64 mg/dL — ABNORMAL HIGH (ref 7–25)
CO2: 19 mmol/L — ABNORMAL LOW (ref 20–32)
Calcium: 8.9 mg/dL (ref 8.6–10.4)
Chloride: 107 mmol/L (ref 98–110)
Creat: 7.06 mg/dL — ABNORMAL HIGH (ref 0.50–1.05)
Globulin: 2.5 g/dL (calc) (ref 1.9–3.7)
Glucose, Bld: 81 mg/dL (ref 65–99)
Potassium: 4.3 mmol/L (ref 3.5–5.3)
Sodium: 140 mmol/L (ref 135–146)
Total Bilirubin: 0.3 mg/dL (ref 0.2–1.2)
Total Protein: 6.5 g/dL (ref 6.1–8.1)
eGFR: 6 mL/min/{1.73_m2} — ABNORMAL LOW (ref >=60)

## 2023-01-01 LAB — CBC WITH DIFFERENTIAL/PLATELET
Absolute Monocytes: 621 cells/uL (ref 200–950)
Basophils Absolute: 27 cells/uL (ref 0–200)
Basophils Relative: 0.3 %
Eosinophils Absolute: 144 cells/uL (ref 15–500)
Eosinophils Relative: 1.6 %
HCT: 37.5 % (ref 35.0–45.0)
Hemoglobin: 11.4 g/dL — ABNORMAL LOW (ref 11.7–15.5)
Lymphs Abs: 1944 cells/uL (ref 850–3900)
MCH: 25.2 pg — ABNORMAL LOW (ref 27.0–33.0)
MCHC: 30.4 g/dL — ABNORMAL LOW (ref 32.0–36.0)
MCV: 83 fL (ref 80.0–100.0)
MPV: 8.7 fL (ref 7.5–12.5)
Monocytes Relative: 6.9 %
Neutro Abs: 6264 cells/uL (ref 1500–7800)
Neutrophils Relative %: 69.6 %
Platelets: 237 10*3/uL (ref 140–400)
RBC: 4.52 10*6/uL (ref 3.80–5.10)
RDW: 16.4 % — ABNORMAL HIGH (ref 11.0–15.0)
Total Lymphocyte: 21.6 %
WBC: 9 10*3/uL (ref 3.8–10.8)

## 2023-01-01 LAB — HEMOGLOBIN A1C
Hgb A1c MFr Bld: 5.3 % of total Hgb (ref <5.7)
Mean Plasma Glucose: 105 mg/dL
eAG (mmol/L): 5.8 mmol/L

## 2023-01-01 LAB — HEPATITIS C ANTIBODY: Hepatitis C Ab: NONREACTIVE

## 2023-01-01 LAB — LIPID PANEL
Cholesterol: 227 mg/dL — ABNORMAL HIGH (ref <200)
HDL: 46 mg/dL — ABNORMAL LOW (ref >=50)
LDL Cholesterol (Calc): 145 mg/dL (calc) — ABNORMAL HIGH
Non-HDL Cholesterol (Calc): 181 mg/dL (calc) — ABNORMAL HIGH (ref <130)
Total CHOL/HDL Ratio: 4.9 (calc) (ref <5.0)
Triglycerides: 221 mg/dL — ABNORMAL HIGH (ref <150)

## 2023-01-01 LAB — VITAMIN D 25 HYDROXY (VIT D DEFICIENCY, FRACTURES): Vit D, 25-Hydroxy: 23 ng/mL — ABNORMAL LOW (ref 30–100)

## 2023-01-02 ENCOUNTER — Telehealth: Payer: Self-pay

## 2023-01-02 NOTE — Telephone Encounter (Signed)
Medicaid Managed Care   Unsuccessful Outreach Note  01/02/2023 Name: Devina Keckler MRN: [id number] DOB: 1960/10/01  Referred by: Park Meo, FNP Reason for referral : No chief complaint on file.   An unsuccessful telephone outreach was attempted today. The patient was referred to the case management team for assistance with care management and care coordination.   Follow Up Plan: If patient returns call to provider office, please advise to call Embedded Care Management Care Guide Nicholes Rough* at [phone]*  Nicholes Rough, CMA Care Guide [employer]

## 2023-01-06 ENCOUNTER — Ambulatory Visit: Payer: Medicaid Other | Admitting: Family Medicine

## 2023-01-06 VITALS — BP 130/82 | HR 77 | Temp 98.7°F | Ht 61.0 in | Wt 206.0 lb

## 2023-01-06 DIAGNOSIS — E782 Mixed hyperlipidemia: Secondary | ICD-10-CM

## 2023-01-06 DIAGNOSIS — Z124 Encounter for screening for malignant neoplasm of cervix: Secondary | ICD-10-CM

## 2023-01-06 DIAGNOSIS — I10 Essential (primary) hypertension: Secondary | ICD-10-CM | POA: Diagnosis not present

## 2023-01-06 MED ORDER — ROSUVASTATIN CALCIUM 10 MG PO TABS: 10.0000 mg | ORAL_TABLET | Freq: Every day | ORAL | 0 refills | Status: DC

## 2023-01-06 NOTE — Assessment & Plan Note (Signed)
Chronic. Well controlled when compliant with medications. Emphasized importance of medication compliance. Recommend heart healthy diet such as Mediterranean diet with whole grains, fruits, vegetable, fish, lean meats, nuts, and olive oil. Limit salt. Encouraged moderate walking, 3-5 times/week for 30-50 minutes each session. Aim for at least 150 minutes.week. Goal should be pace of 3 miles/hours, or walking 1.5 miles in 30 minutes. Avoid tobacco products. Avoid excess alcohol. Take medications as prescribed and bring medications and blood pressure log with cuff to each office visit. Seek medical care for chest pain, palpitations, shortness of breath with exertion, dizziness/lightheadedness, vision changes, recurrent headaches, or swelling of extremities.

## 2023-01-06 NOTE — Assessment & Plan Note (Signed)
Start Rosuvastatin 10mg  daily.  Counseled on importance of a heart healthy diet such as Mediterranean diet or DASH diet with whole grains, fruits, vegetable, fish, lean meats, nuts, and olive oil. Limit sweets and processed foods. I also encourage moderate intensity exercise 150 minutes weekly. This is 3-5 times weekly for 30-50 minutes each session. Goal should be pace of 3 miles/hours, or walking 1.5 miles in 30 minutes. The 10-year ASCVD risk score (Arnett DK, et al., 2019) is: 19.9%

## 2023-01-06 NOTE — Progress Notes (Signed)
Subjective:  HPI: Margaret Aguilar is a 62 y.o. female presenting on 01/06/2023 for Follow-up (F/u ) and Hypertension   HPI Patient is in today for blood pressure follow up. She has been monitoring at home daily and reports readings have been 130-140/80-90. We discussed her labs today with an emphasis on getting better control of her cholesterol and reducing her ASCVD risk. She has resumed her Vitamin D. CKD at baseline.  HYPERTENSION with Chronic Kidney Disease Hypertension status: better  Satisfied with current treatment? yes Duration of hypertension: chronic BP monitoring frequency:  daily BP range: 130-140/80-90 BP medication side effects:  no Medication compliance: excellent compliance Previous BP meds:amlodipine and carvedilol, Bidil Aspirin: no Recurrent headaches: no Visual changes: no Palpitations: no Dyspnea: no Chest pain: no Lower extremity edema: no Dizzy/lightheaded: no  The 10-year ASCVD risk score (Arnett DK, et al., 2019) is: 19.9%    Review of Systems  All other systems reviewed and are negative.   Relevant past medical history reviewed and updated as indicated.   Past Medical History:  Diagnosis Date   Anemia    Anxiety    Arthritis    Asthma    Back pain    Bradycardia    Depression    Diabetes (HCC)    Diabetes mellitus without complication (HCC)    Drug use    GERD (gastroesophageal reflux disease)    Gout    Herpes    High cholesterol    HLD (hyperlipidemia)    Hypertension    Joint pain    Kidney disease    Stage 4   Kidney disease, chronic, stage IV (severe, EGFR 15-29 ml/min) (HCC)    Palpitations    Pneumonia    several   Sleep apnea    does not tolerate CPAP   SOB (shortness of breath)    Vitamin D-dependent rickets      Past Surgical History:  Procedure Laterality Date   A/V FISTULAGRAM Left 02/24/2022   Procedure: A/V Fistulagram;  Surgeon: Maeola Harman, MD;  Location: [facility] INVASIVE CV LAB;  Service:  Cardiovascular;  Laterality: Left;   AV FISTULA PLACEMENT Left 10/19/2018   Procedure: LEFT ARTERIOVENOUS (AV) FISTULA CREATION;  Surgeon: Maeola Harman, MD;  Location: [facility] OR;  Service: Vascular;  Laterality: Left;   BASCILIC VEIN TRANSPOSITION Left 06/30/2019   Procedure: Bascilic Vein Transposition;  Surgeon: Maeola Harman, MD;  Location: [facility] OR;  Service: Vascular;  Laterality: Left;   CESAREAN SECTION     x 1   COLONOSCOPY N/A 01/12/2014   Procedure: COLONOSCOPY;  Surgeon: Malissa Hippo, MD;  [facility];  Service: Endoscopy;  Laterality: N/A;  225   D&C     FISTULA SUPERFICIALIZATION Left 03/28/2022   Procedure: LEFT ARM CEPHALIC VEIN FISTULA SUPERFICIALIZATION;  Surgeon: Maeola Harman, MD;  Location: [facility] OR;  Service: Vascular;  Laterality: Left;   TUBAL LIGATION      Allergies and medications reviewed and updated.   Current Outpatient Medications:    acetaminophen (TYLENOL) 500 MG tablet, Take 2 tablets (1,000 mg total) by mouth every 6 (six) hours as needed for moderate pain., Disp: 30 tablet, Rfl: 2   albuterol (PROVENTIL) (2.5 MG/3ML) 0.083% nebulizer solution, Take 3 mLs (2.5 mg total) by nebulization every 6 (six) hours as needed for wheezing or shortness of breath., Disp: 150 mL, Rfl: 11   albuterol (VENTOLIN HFA) 108 (90 Base) MCG/ACT inhaler, Inhale 2 puffs into the lungs every 6 (six)  hours as needed for wheezing or shortness of breath., Disp: 8 g, Rfl: 0   allopurinol (ZYLOPRIM) 100 MG tablet, Take 0.5 tablets (50 mg total) by mouth 2 (two) times a week., Disp: 15 tablet, Rfl: 2   calcitRIOL (ROCALTROL) 0.25 MCG capsule, Take 1 capsule (0.25 mcg total) by mouth daily., Disp: 30 capsule, Rfl: 2   calcium carbonate (TUMS - DOSED IN MG ELEMENTAL CALCIUM) 500 MG chewable tablet, Chew 2 tablets (400 mg of elemental calcium total) by mouth daily as needed for indigestion or heartburn., Disp: 60 tablet, Rfl: 2   carvedilol (COREG) 12.5 MG  tablet, Take 1 tablet (12.5 mg total) by mouth 2 (two) times daily with a meal. (Patient taking differently: Take 6.25 mg by mouth 2 (two) times daily with a meal.), Disp: 60 tablet, Rfl: 3   chlorhexidine (HIBICLENS) 4 % external liquid, Apply topically daily as needed. (Patient taking differently: Apply 1 Application topically daily as needed (boil).), Disp: 120 mL, Rfl: 0   colchicine 0.6 MG tablet, Take 0.3 mg once, and then every 3 days until gout flare is resolved (Patient taking differently: Take 0.3 mg by mouth See admin instructions. Take 0.3 mg once, and then every 3 days until gout flare is resolved as needed), Disp: 30 tablet, Rfl: 1   docusate sodium (COLACE) 100 MG capsule, Take 1 capsule (100 mg total) by mouth 2 (two) times daily., Disp: 10 capsule, Rfl: 0   isosorbide-hydrALAZINE (BIDIL) 20-37.5 MG tablet, Take 1 tablet by mouth in the morning and at bedtime., Disp: , Rfl:    mupirocin ointment (BACTROBAN) 2 %, Apply 1 Application topically 2 (two) times daily., Disp: 22 g, Rfl: 0   ondansetron (ZOFRAN) 4 MG tablet, Take 1 tablet (4 mg total) by mouth every 8 (eight) hours as needed for nausea or vomiting., Disp: 20 tablet, Rfl: 0   oxyCODONE-acetaminophen (PERCOCET) 5-325 MG tablet, Take 1 tablet by mouth every 6 (six) hours as needed for severe pain., Disp: 20 tablet, Rfl: 0   promethazine-dextromethorphan (PROMETHAZINE-DM) 6.25-15 MG/5ML syrup, Take 5 mLs by mouth at bedtime as needed for cough., Disp: 100 mL, Rfl: 0   rosuvastatin (CRESTOR) 10 MG tablet, Take 1 tablet (10 mg total) by mouth daily., Disp: 90 tablet, Rfl: 0   Semaglutide,0.25 or 0.5MG /DOS, (OZEMPIC, 0.25 OR 0.5 MG/DOSE,) 2 MG/3ML SOPN, INJECT 0.25 MG WEEKLY FOR 4 WEEKS, THEN INCREASE TO 0.5 MG WEEKLY, Disp: 3 mL, Rfl: 2   amLODipine (NORVASC) 10 MG tablet, Take 1 tablet (10 mg total) by mouth daily., Disp: 30 tablet, Rfl: 2   furosemide (LASIX) 40 MG tablet, Take 1 tablet (40 mg total) by mouth daily., Disp: 30 tablet,  Rfl: 2   montelukast (SINGULAIR) 10 MG tablet, Take 1 tablet (10 mg total) by mouth at bedtime., Disp: 90 tablet, Rfl: 3  Allergies  Allergen Reactions   Nutritional Supplements     Walnut >> UNSPECIFIED REACTION  Severity from PMH   Latex Swelling and Rash    SWELLING REACTION UNSPECIFIED     Objective:   BP 130/82   Pulse 77   Temp 98.7 F (37.1 C) (Oral)   Ht 5\' 1"  (1.549 m)   Wt 206 lb (93.4 kg)   LMP  (LMP Unknown)   SpO2 99%   BMI 38.92 kg/m      01/06/2023    8:21 AM 12/30/2022    3:06 PM 12/30/2022    2:59 PM  Vitals with BMI  Height 5\' 1"   5\' 1"   Weight 206 lbs  204 lbs  BMI 38.94  38.57  Systolic 130 166 578  Diastolic 82 100 100  Pulse 77  64     Physical Exam Vitals and nursing note reviewed.  Constitutional:      Appearance: Normal appearance. She is obese.  HENT:     Head: Normocephalic and atraumatic.  Cardiovascular:     Rate and Rhythm: Normal rate and regular rhythm.     Pulses: Normal pulses.     Heart sounds: Normal heart sounds.  Pulmonary:     Effort: Pulmonary effort is normal.     Breath sounds: Normal breath sounds.  Skin:    General: Skin is warm and dry.  Neurological:     General: No focal deficit present.     Mental Status: She is alert and oriented to person, place, and time. Mental status is at baseline.  Psychiatric:        Mood and Affect: Mood normal.        Behavior: Behavior normal.        Thought Content: Thought content normal.        Judgment: Judgment normal.     Assessment & Plan:  Mixed hyperlipidemia Assessment & Plan: Start Rosuvastatin 10mg  daily.  Counseled on importance of a heart healthy diet such as Mediterranean diet or DASH diet with whole grains, fruits, vegetable, fish, lean meats, nuts, and olive oil. Limit sweets and processed foods. I also encourage moderate intensity exercise 150 minutes weekly. This is 3-5 times weekly for 30-50 minutes each session. Goal should be pace of 3 miles/hours, or  walking 1.5 miles in 30 minutes. The 10-year ASCVD risk score (Arnett DK, et al., 2019) is: 19.9%   Orders: -     COMPLETE METABOLIC PANEL WITH GFR; Future -     Lipid panel; Future  Essential hypertension Assessment & Plan: Chronic. Well controlled when compliant with medications. Emphasized importance of medication compliance. Recommend heart healthy diet such as Mediterranean diet with whole grains, fruits, vegetable, fish, lean meats, nuts, and olive oil. Limit salt. Encouraged moderate walking, 3-5 times/week for 30-50 minutes each session. Aim for at least 150 minutes.week. Goal should be pace of 3 miles/hours, or walking 1.5 miles in 30 minutes. Avoid tobacco products. Avoid excess alcohol. Take medications as prescribed and bring medications and blood pressure log with cuff to each office visit. Seek medical care for chest pain, palpitations, shortness of breath with exertion, dizziness/lightheadedness, vision changes, recurrent headaches, or swelling of extremities.   Orders: -     COMPLETE METABOLIC PANEL WITH GFR; Future -     Lipid panel; Future  Cervical cancer screening  Other orders -     Rosuvastatin Calcium; Take 1 tablet (10 mg total) by mouth daily.  Dispense: 90 tablet; Refill: 0     Follow up plan: Return in about 6 weeks (around 02/17/2023) for chronic follow-up with labs 1 week prior.  Park Meo, FNP

## 2023-01-14 ENCOUNTER — Ambulatory Visit (INDEPENDENT_AMBULATORY_CARE_PROVIDER_SITE_OTHER): Payer: Medicaid Other

## 2023-01-14 DIAGNOSIS — E119 Type 2 diabetes mellitus without complications: Secondary | ICD-10-CM

## 2023-01-14 DIAGNOSIS — Z7984 Long term (current) use of oral hypoglycemic drugs: Secondary | ICD-10-CM

## 2023-01-14 DIAGNOSIS — E1122 Type 2 diabetes mellitus with diabetic chronic kidney disease: Secondary | ICD-10-CM

## 2023-01-14 DIAGNOSIS — Z794 Long term (current) use of insulin: Secondary | ICD-10-CM

## 2023-01-14 LAB — HM DIABETES EYE EXAM

## 2023-01-14 NOTE — Progress Notes (Signed)
Margaret Aguilar arrived 01/14/2023 and has given verbal consent to obtain images and complete their overdue diabetic retinal screening.  The images have been sent to an ophthalmologist or optometrist for review and interpretation.  Results will be sent back to Park Meo, FNP for review.  Patient has been informed they will be contacted when we receive the results via telephone or MyChart

## 2023-01-15 DIAGNOSIS — N281 Cyst of kidney, acquired: Secondary | ICD-10-CM | POA: Diagnosis not present

## 2023-01-20 ENCOUNTER — Other Ambulatory Visit: Payer: Self-pay | Admitting: Family Medicine

## 2023-01-20 DIAGNOSIS — K5900 Constipation, unspecified: Secondary | ICD-10-CM

## 2023-01-20 MED ORDER — DOCUSATE SODIUM 100 MG PO CAPS: 100.0000 mg | ORAL_CAPSULE | Freq: Two times a day (BID) | ORAL | 0 refills | Status: DC

## 2023-01-22 ENCOUNTER — Encounter: Payer: Self-pay | Admitting: Family Medicine

## 2023-01-22 NOTE — Progress Notes (Signed)
DEE results rec'01/14/23 no diabetic retinopathy

## 2023-02-09 ENCOUNTER — Ambulatory Visit: Payer: Medicaid Other | Admitting: Family Medicine

## 2023-02-13 ENCOUNTER — Other Ambulatory Visit: Payer: Medicaid Other

## 2023-02-13 DIAGNOSIS — E782 Mixed hyperlipidemia: Secondary | ICD-10-CM | POA: Diagnosis not present

## 2023-02-13 LAB — LIPID PANEL
Cholesterol: 166 mg/dL (ref <200)
HDL: 42 mg/dL — ABNORMAL LOW (ref >=50)
LDL Cholesterol (Calc): 104 mg/dL — ABNORMAL HIGH
Non-HDL Cholesterol (Calc): 124 mg/dL (ref <130)
Total CHOL/HDL Ratio: 4 (calc) (ref <5.0)
Triglycerides: 105 mg/dL (ref <150)

## 2023-02-18 ENCOUNTER — Ambulatory Visit: Payer: Medicaid Other | Admitting: Family Medicine

## 2023-03-09 ENCOUNTER — Ambulatory Visit: Payer: Medicaid Other

## 2023-03-23 ENCOUNTER — Other Ambulatory Visit (INDEPENDENT_AMBULATORY_CARE_PROVIDER_SITE_OTHER): Payer: Medicaid Other

## 2023-03-23 ENCOUNTER — Other Ambulatory Visit: Payer: Self-pay | Admitting: Family Medicine

## 2023-03-23 ENCOUNTER — Encounter: Payer: Self-pay | Admitting: Family Medicine

## 2023-03-23 ENCOUNTER — Ambulatory Visit: Payer: Medicaid Other | Admitting: Family Medicine

## 2023-03-23 ENCOUNTER — Ambulatory Visit: Payer: Medicaid Other | Attending: Family Medicine

## 2023-03-23 VITALS — BP 130/70 | HR 74 | Temp 97.9°F | Ht 61.0 in | Wt 208.0 lb

## 2023-03-23 DIAGNOSIS — F339 Major depressive disorder, recurrent, unspecified: Secondary | ICD-10-CM | POA: Diagnosis not present

## 2023-03-23 DIAGNOSIS — E782 Mixed hyperlipidemia: Secondary | ICD-10-CM

## 2023-03-23 DIAGNOSIS — I499 Cardiac arrhythmia, unspecified: Secondary | ICD-10-CM

## 2023-03-23 DIAGNOSIS — Z23 Encounter for immunization: Secondary | ICD-10-CM

## 2023-03-23 DIAGNOSIS — I10 Essential (primary) hypertension: Secondary | ICD-10-CM | POA: Diagnosis not present

## 2023-03-23 DIAGNOSIS — I493 Ventricular premature depolarization: Secondary | ICD-10-CM

## 2023-03-23 NOTE — Assessment & Plan Note (Signed)
EKG showed NSR with PVCs. CMP done today.

## 2023-03-23 NOTE — Assessment & Plan Note (Signed)
Continue Rosuvastatin 10mg  daily. Fasting labs today. I recommend consuming a heart healthy diet such as Mediterranean diet or DASH diet with whole grains, fruits, vegetable, fish, lean meats, nuts, and olive oil. Limit sweets and processed foods. I also encourage moderate intensity exercise 150 minutes weekly. This is 3-5 times weekly for 30-50 minutes each session. Goal should be pace of 3 miles/hours, or walking 1.5 miles in 30 minutes. The 10-year ASCVD risk score (Arnett DK, et al., 2019) is: 16%

## 2023-03-23 NOTE — Assessment & Plan Note (Signed)
Chronic well controlled with counseling. Her recent counselor has stopped practicing and she would like referral to new therapist.

## 2023-03-23 NOTE — Progress Notes (Signed)
Subjective:  HPI: Margaret Aguilar is a 62 y.o. female presenting on 03/23/2023 for Follow-up (6 wk f/u)   HPI Patient is in today for follow-up for HTN/HLD. She does see Cardiology Dr. Sharyn Lull with [facility], last seen 6 months ago, is due for check up and working on scheduling this. She is not taking Carvedilol and has not been for 3 months. Reports she does take Vitamin D supplement.  HYPERTENSION / HYPERLIPIDEMIA Satisfied with current treatment? yes Duration of hypertension: chronic BP monitoring frequency: a few times a week BP range: 118/80-140/90 BP medication side effects: no Past BP meds: carvedilol, isosorbide-hydralazine, amlodipine Duration of hyperlipidemia: chronic Cholesterol medication side effects: no Cholesterol supplements: none Past cholesterol medications: rosuvastatin (crestor) Medication compliance: excellent compliance Aspirin: no Recent stressors: no Recurrent headaches: no Visual changes: no Palpitations: no Dyspnea: no Chest pain: no Lower extremity edema: no Dizzy/lightheaded: no     03/23/2023    8:28 AM 12/30/2022    4:27 PM 06/06/2022   11:39 AM 11/15/2021    8:31 AM 08/15/2021    8:20 AM  Depression screen PHQ 2/9  Decreased Interest 2 2 0 0 0  Down, Depressed, Hopeless 2 3 0 2 0  PHQ - 2 Score 4 5 0 2 0  Altered sleeping 2 2  3    Tired, decreased energy 3 3  3    Change in appetite 1 2  2    Feeling bad or failure about yourself  1 2  2    Trouble concentrating 2 3  1    Moving slowly or fidgety/restless 0 2  0   Suicidal thoughts 0 0  0   PHQ-9 Score 13 19  13    Difficult doing work/chores Somewhat difficult Somewhat difficult        Review of Systems  All other systems reviewed and are negative.   Relevant past medical history reviewed and updated as indicated.   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)    Palpitations    Pneumonia    several   Sleep apnea    does not tolerate CPAP   SOB (shortness of breath)    Vitamin D-dependent rickets      Past Surgical History:  Procedure Laterality Date   A/V FISTULAGRAM Left 02/24/2022   Procedure: A/V Fistulagram;  Surgeon: Maeola Harman, MD;  Location: [facility] INVASIVE CV LAB;  Service: Cardiovascular;  Laterality: Left;   AV FISTULA PLACEMENT Left 10/19/2018   Procedure: LEFT ARTERIOVENOUS (AV) FISTULA CREATION;  Surgeon: Maeola Harman, MD;  Location: [facility] OR;  Service: Vascular;  Laterality: Left;   BASCILIC VEIN TRANSPOSITION Left 06/30/2019   Procedure: Bascilic Vein Transposition;  Surgeon: Maeola Harman, MD;  Location: [facility] OR;  Service: Vascular;  Laterality: Left;   CESAREAN SECTION     x 1   COLONOSCOPY N/A 01/12/2014   Procedure: COLONOSCOPY;  Surgeon: Malissa Hippo, MD;  Location: [facility];  Service: Endoscopy;  Laterality: N/A;  225   D&C     FISTULA SUPERFICIALIZATION Left 03/28/2022   Procedure: LEFT ARM CEPHALIC VEIN FISTULA SUPERFICIALIZATION;  Surgeon: Maeola Harman, MD;  Location: [facility] OR;  Service: Vascular;  Laterality: Left;   TUBAL LIGATION      Allergies and medications reviewed and updated.   Current Outpatient Medications:    acetaminophen (TYLENOL) 500 MG tablet, Take 2 tablets (1,000 mg total) by mouth every 6 (six) hours as needed for moderate pain., Disp: 30 tablet, Rfl: 2   albuterol (PROVENTIL) (2.5 MG/3ML) 0.083% nebulizer solution, Take 3 mLs (2.5 mg total) by nebulization every 6 (six) hours as needed for wheezing or shortness of breath., Disp: 150 mL, Rfl: 11   albuterol  (VENTOLIN HFA) 108 (90 Base) MCG/ACT inhaler, Inhale 2 puffs into the lungs every 6 (six) hours as needed for wheezing or shortness of breath., Disp: 8 g, Rfl: 0   allopurinol (ZYLOPRIM) 100 MG tablet, Take 0.5 tablets (50 mg total) by mouth 2 (two) times a week., Disp: 15 tablet, Rfl: 2   calcitRIOL (ROCALTROL) 0.25 MCG capsule, Take 1 capsule (0.25 mcg total) by mouth daily., Disp: 30 capsule, Rfl: 2   calcium carbonate (TUMS - DOSED IN MG ELEMENTAL CALCIUM) 500 MG chewable tablet, Chew 2 tablets (400 mg of elemental calcium total) by mouth daily as needed for indigestion or heartburn., Disp: 60 tablet, Rfl: 2   carvedilol (COREG) 12.5 MG tablet, Take 1 tablet (12.5 mg total) by mouth 2 (two) times daily with a meal. (Patient taking differently: Take 6.25 mg by mouth 2 (two) times daily with a meal.), Disp: 60 tablet, Rfl: 3   chlorhexidine (HIBICLENS) 4 % external liquid, Apply topically daily as needed. (Patient taking differently: Apply 1 Application topically daily as needed (boil).), Disp: 120 mL, Rfl: 0   colchicine 0.6 MG tablet, Take 0.3 mg once, and then every 3 days until gout flare is resolved (Patient taking differently: Take 0.3 mg by mouth See admin instructions. Take 0.3 mg once, and then every 3 days until gout flare is resolved as needed), Disp: 30 tablet, Rfl: 1   docusate sodium (COLACE) 100 MG capsule, Take 1 capsule (100 mg total) by mouth 2 (two) times daily., Disp: 10 capsule, Rfl: 0   isosorbide-hydrALAZINE (BIDIL) 20-37.5 MG tablet, Take 1 tablet by mouth in the morning and at bedtime., Disp: , Rfl:    mupirocin ointment (BACTROBAN) 2 %, Apply 1 Application topically 2 (two) times daily., Disp: 22 g, Rfl: 0   ondansetron (ZOFRAN) 4 MG tablet, Take 1 tablet (4 mg total) by mouth every 8 (eight) hours as needed for nausea or vomiting., Disp: 20 tablet, Rfl: 0   promethazine-dextromethorphan (PROMETHAZINE-DM) 6.25-15 MG/5ML syrup, Take 5 mLs by mouth at bedtime as needed for  cough., Disp: 100 mL, Rfl: 0   rosuvastatin (CRESTOR) 10 MG tablet, Take 1 tablet (10 mg total) by mouth daily., Disp: 90 tablet, Rfl: 0   Semaglutide,0.25 or 0.5MG /DOS, (OZEMPIC, 0.25 OR 0.5 MG/DOSE,) 2 MG/3ML SOPN, INJECT 0.25 MG WEEKLY FOR 4 WEEKS, THEN INCREASE TO 0.5 MG WEEKLY, Disp: 3 mL, Rfl: 2   amLODipine (NORVASC) 10 MG tablet, Take 1 tablet (10 mg total) by mouth daily., Disp: 30 tablet, Rfl: 2   furosemide (LASIX) 40 MG tablet, Take 1 tablet (40 mg total) by mouth daily., Disp: 30 tablet, Rfl: 2   montelukast (SINGULAIR) 10 MG tablet, Take 1 tablet (10 mg  total) by mouth at bedtime., Disp: 90 tablet, Rfl: 3  Allergies  Allergen Reactions   Nutritional Supplements     Walnut >> UNSPECIFIED REACTION  Severity from PMH   Latex Swelling and Rash    SWELLING REACTION UNSPECIFIED     Objective:   BP 130/70   Pulse 74   Temp 97.9 F (36.6 C) (Oral)   Ht 5\' 1"  (1.549 m)   Wt 208 lb (94.3 kg)   LMP  (LMP Unknown)   SpO2 96%   BMI 39.30 kg/m      03/23/2023    8:12 AM 01/06/2023    8:21 AM 12/30/2022    3:06 PM  Vitals with BMI  Height 5\' 1"  5\' 1"    Weight 208 lbs 206 lbs   BMI 39.32 38.94   Systolic 130 130 161  Diastolic 70 82 100  Pulse 74 77      Physical Exam Vitals and nursing note reviewed.  Constitutional:      Appearance: Normal appearance. She is obese.  HENT:     Head: Normocephalic and atraumatic.  Cardiovascular:     Rate and Rhythm: Normal rate. Rhythm irregular.     Pulses: Normal pulses.     Heart sounds: Murmur heard.  Pulmonary:     Effort: Pulmonary effort is normal.     Breath sounds: Normal breath sounds.  Skin:    General: Skin is warm and dry.  Neurological:     General: No focal deficit present.     Mental Status: She is alert and oriented to person, place, and time. Mental status is at baseline.  Psychiatric:        Mood and Affect: Mood normal.        Behavior: Behavior normal.        Thought Content: Thought content normal.         Judgment: Judgment normal.     Assessment & Plan:  Primary hypertension Assessment & Plan: Chronic well controlled. Continue Amlodipine 10mg  daily and Isosorbide-hydralazine 20-37.5 BID. Recommend heart healthy diet such as Mediterranean diet with whole grains, fruits, vegetable, fish, lean meats, nuts, and olive oil. Limit salt. Encouraged moderate walking, 3-5 times/week for 30-50 minutes each session. Aim for at least 150 minutes.week. Goal should be pace of 3 miles/hours, or walking 1.5 miles in 30 minutes. Avoid tobacco products. Avoid excess alcohol. Take medications as prescribed and bring medications and blood pressure log with cuff to each office visit. Seek medical care for chest pain, palpitations, shortness of breath with exertion, dizziness/lightheadedness, vision changes, recurrent headaches, or swelling of extremities. Keep appointment with Cardiology and follow-up with me in 3 months or sooner if needed.  Orders: -     CBC with Differential/Platelet -     COMPLETE METABOLIC PANEL WITH GFR -     Lipid panel  Irregular heart rate Assessment & Plan: EKG showed NSR with PVCs. CMP done today.    Orders: -     EKG 12-Lead -     CBC with Differential/Platelet -     COMPLETE METABOLIC PANEL WITH GFR -     Lipid panel -     LONG TERM MONITOR (3-14 DAYS); Future  Depression, recurrent (HCC) Assessment & Plan: Chronic well controlled with counseling. Her recent counselor has stopped practicing and she would like referral to new therapist.   Orders: -     Ambulatory referral to Psychology  Mixed hyperlipidemia Assessment & Plan: Continue Rosuvastatin 10mg  daily. Fasting labs  today. I recommend consuming a heart healthy diet such as Mediterranean diet or DASH diet with whole grains, fruits, vegetable, fish, lean meats, nuts, and olive oil. Limit sweets and processed foods. I also encourage moderate intensity exercise 150 minutes weekly. This is 3-5 times weekly for 30-50  minutes each session. Goal should be pace of 3 miles/hours, or walking 1.5 miles in 30 minutes. The 10-year ASCVD risk score (Arnett DK, et al., 2019) is: 16%   Orders: -     CBC with Differential/Platelet -     COMPLETE METABOLIC PANEL WITH GFR -     Lipid panel     Follow up plan: Return in about 3 months (around 06/21/2023) for PAP and annual physical with labs 1 week prior.  Park Meo, FNP

## 2023-03-23 NOTE — Assessment & Plan Note (Signed)
Chronic well controlled. Continue Amlodipine 10mg  daily and Isosorbide-hydralazine 20-37.5 BID. Recommend heart healthy diet such as Mediterranean diet with whole grains, fruits, vegetable, fish, lean meats, nuts, and olive oil. Limit salt. Encouraged moderate walking, 3-5 times/week for 30-50 minutes each session. Aim for at least 150 minutes.week. Goal should be pace of 3 miles/hours, or walking 1.5 miles in 30 minutes. Avoid tobacco products. Avoid excess alcohol. Take medications as prescribed and bring medications and blood pressure log with cuff to each office visit. Seek medical care for chest pain, palpitations, shortness of breath with exertion, dizziness/lightheadedness, vision changes, recurrent headaches, or swelling of extremities. Keep appointment with Cardiology and follow-up with me in 3 months or sooner if needed.

## 2023-03-23 NOTE — Progress Notes (Unsigned)
EP to read

## 2023-03-23 NOTE — Addendum Note (Signed)
Addended by: Arta Silence on: 03/23/2023 11:23 AM   Modules accepted: Orders

## 2023-03-24 LAB — LIPID PANEL
Cholesterol: 137 mg/dL (ref <200)
HDL: 47 mg/dL — ABNORMAL LOW (ref >=50)
LDL Cholesterol (Calc): 74 mg/dL
Non-HDL Cholesterol (Calc): 90 mg/dL (ref <130)
Total CHOL/HDL Ratio: 2.9 (calc) (ref <5.0)
Triglycerides: 78 mg/dL (ref <150)

## 2023-03-24 LAB — COMPLETE METABOLIC PANEL WITH GFR
AG Ratio: 1.4 (calc) (ref 1.0–2.5)
ALT: 5 U/L — ABNORMAL LOW (ref 6–29)
AST: 13 U/L (ref 10–35)
Albumin: 3.9 g/dL (ref 3.6–5.1)
Alkaline phosphatase (APISO): 88 U/L (ref 37–153)
BUN/Creatinine Ratio: 9 (calc) (ref 6–22)
BUN: 58 mg/dL — ABNORMAL HIGH (ref 7–25)
CO2: 21 mmol/L (ref 20–32)
Calcium: 8.8 mg/dL (ref 8.6–10.4)
Chloride: 108 mmol/L (ref 98–110)
Creat: 6.57 mg/dL — ABNORMAL HIGH (ref 0.50–1.05)
Globulin: 2.8 g/dL (ref 1.9–3.7)
Glucose, Bld: 131 mg/dL — ABNORMAL HIGH (ref 65–99)
Potassium: 3.9 mmol/L (ref 3.5–5.3)
Sodium: 141 mmol/L (ref 135–146)
Total Bilirubin: 0.3 mg/dL (ref 0.2–1.2)
Total Protein: 6.7 g/dL (ref 6.1–8.1)
eGFR: 7 mL/min/{1.73_m2} — ABNORMAL LOW (ref >=60)

## 2023-03-24 LAB — CBC WITH DIFFERENTIAL/PLATELET
Absolute Lymphocytes: 1907 {cells}/uL (ref 850–3900)
Absolute Monocytes: 493 {cells}/uL (ref 200–950)
Basophils Absolute: 19 {cells}/uL (ref 0–200)
Basophils Relative: 0.2 %
Eosinophils Absolute: 167 {cells}/uL (ref 15–500)
Eosinophils Relative: 1.8 %
HCT: 33.8 % — ABNORMAL LOW (ref 35.0–45.0)
Hemoglobin: 10.5 g/dL — ABNORMAL LOW (ref 11.7–15.5)
MCH: 25.1 pg — ABNORMAL LOW (ref 27.0–33.0)
MCHC: 31.1 g/dL — ABNORMAL LOW (ref 32.0–36.0)
MCV: 80.9 fL (ref 80.0–100.0)
MPV: 9 fL (ref 7.5–12.5)
Monocytes Relative: 5.3 %
Neutro Abs: 6715 {cells}/uL (ref 1500–7800)
Neutrophils Relative %: 72.2 %
Platelets: 246 10*3/uL (ref 140–400)
RBC: 4.18 10*6/uL (ref 3.80–5.10)
RDW: 16.5 % — ABNORMAL HIGH (ref 11.0–15.0)
Total Lymphocyte: 20.5 %
WBC: 9.3 10*3/uL (ref 3.8–10.8)

## 2023-03-30 ENCOUNTER — Other Ambulatory Visit: Payer: Self-pay | Admitting: Family Medicine

## 2023-03-30 DIAGNOSIS — K5900 Constipation, unspecified: Secondary | ICD-10-CM

## 2023-03-31 ENCOUNTER — Other Ambulatory Visit: Payer: Self-pay

## 2023-03-31 ENCOUNTER — Other Ambulatory Visit: Payer: Self-pay | Admitting: Family Medicine

## 2023-03-31 DIAGNOSIS — I10 Essential (primary) hypertension: Secondary | ICD-10-CM

## 2023-03-31 DIAGNOSIS — N185 Chronic kidney disease, stage 5: Secondary | ICD-10-CM

## 2023-03-31 DIAGNOSIS — K5900 Constipation, unspecified: Secondary | ICD-10-CM

## 2023-03-31 DIAGNOSIS — D509 Iron deficiency anemia, unspecified: Secondary | ICD-10-CM

## 2023-03-31 MED ORDER — AMLODIPINE BESYLATE 10 MG PO TABS: 10.0000 mg | ORAL_TABLET | Freq: Every day | ORAL | 2 refills | Status: DC

## 2023-03-31 MED ORDER — IRON (FERROUS SULFATE) 325 (65 FE) MG PO TABS: 325.0000 mg | ORAL_TABLET | ORAL | 0 refills | Status: DC

## 2023-03-31 MED ORDER — FUROSEMIDE 40 MG PO TABS: 40.0000 mg | ORAL_TABLET | Freq: Every day | ORAL | 2 refills | Status: AC

## 2023-03-31 MED ORDER — DOCUSATE SODIUM 100 MG PO CAPS: 100.0000 mg | ORAL_CAPSULE | Freq: Two times a day (BID) | ORAL | 1 refills | Status: DC

## 2023-04-01 ENCOUNTER — Telehealth: Payer: Self-pay

## 2023-04-01 NOTE — Telephone Encounter (Signed)
Copied from CRM [id number]. Topic: General - Other >> Apr 01, 2023 12:31 PM Colletta Maryland S wrote: Reason for CRM: Bjorn Loser from [facility] is calling in with questions in regards to pt, would like a callback [phone]

## 2023-04-01 NOTE — Telephone Encounter (Signed)
Spoke w/Rhonda from [facility] re pt's referral. Bjorn Loser stated that due to the pt's high level of creatinine, recommends that pt be seen somewhere closer. Pt may end up needing dialysis, or may not.   Send ref back to referrals coordinator to send referral STAT to a different nephrology facility that is closer to pt.

## 2023-04-03 ENCOUNTER — Other Ambulatory Visit: Payer: Self-pay | Admitting: Family Medicine

## 2023-04-27 ENCOUNTER — Other Ambulatory Visit: Payer: Self-pay

## 2023-04-27 DIAGNOSIS — E1122 Type 2 diabetes mellitus with diabetic chronic kidney disease: Secondary | ICD-10-CM

## 2023-04-27 MED ORDER — OZEMPIC (0.25 OR 0.5 MG/DOSE) 2 MG/3ML ~~LOC~~ SOPN: PEN_INJECTOR | SUBCUTANEOUS | 2 refills | Status: DC

## 2023-04-29 DIAGNOSIS — N2581 Secondary hyperparathyroidism of renal origin: Secondary | ICD-10-CM | POA: Diagnosis not present

## 2023-04-29 DIAGNOSIS — M109 Gout, unspecified: Secondary | ICD-10-CM | POA: Diagnosis not present

## 2023-04-29 DIAGNOSIS — I770 Arteriovenous fistula, acquired: Secondary | ICD-10-CM | POA: Diagnosis not present

## 2023-04-29 DIAGNOSIS — N185 Chronic kidney disease, stage 5: Secondary | ICD-10-CM | POA: Diagnosis not present

## 2023-04-29 DIAGNOSIS — N189 Chronic kidney disease, unspecified: Secondary | ICD-10-CM | POA: Diagnosis not present

## 2023-04-29 DIAGNOSIS — I120 Hypertensive chronic kidney disease with stage 5 chronic kidney disease or end stage renal disease: Secondary | ICD-10-CM | POA: Diagnosis not present

## 2023-04-29 DIAGNOSIS — Z6841 Body Mass Index (BMI) 40.0 and over, adult: Secondary | ICD-10-CM | POA: Diagnosis not present

## 2023-04-29 DIAGNOSIS — E1129 Type 2 diabetes mellitus with other diabetic kidney complication: Secondary | ICD-10-CM | POA: Diagnosis not present

## 2023-04-29 DIAGNOSIS — D631 Anemia in chronic kidney disease: Secondary | ICD-10-CM | POA: Diagnosis not present

## 2023-04-29 DIAGNOSIS — E785 Hyperlipidemia, unspecified: Secondary | ICD-10-CM | POA: Diagnosis not present

## 2023-04-29 DIAGNOSIS — N281 Cyst of kidney, acquired: Secondary | ICD-10-CM | POA: Diagnosis not present

## 2023-05-25 DIAGNOSIS — E1129 Type 2 diabetes mellitus with other diabetic kidney complication: Secondary | ICD-10-CM | POA: Diagnosis not present

## 2023-05-25 DIAGNOSIS — I770 Arteriovenous fistula, acquired: Secondary | ICD-10-CM | POA: Diagnosis not present

## 2023-05-25 DIAGNOSIS — I120 Hypertensive chronic kidney disease with stage 5 chronic kidney disease or end stage renal disease: Secondary | ICD-10-CM | POA: Diagnosis not present

## 2023-05-25 DIAGNOSIS — D631 Anemia in chronic kidney disease: Secondary | ICD-10-CM | POA: Diagnosis not present

## 2023-05-25 DIAGNOSIS — N2581 Secondary hyperparathyroidism of renal origin: Secondary | ICD-10-CM | POA: Diagnosis not present

## 2023-05-25 DIAGNOSIS — N189 Chronic kidney disease, unspecified: Secondary | ICD-10-CM | POA: Diagnosis not present

## 2023-05-25 DIAGNOSIS — N281 Cyst of kidney, acquired: Secondary | ICD-10-CM | POA: Diagnosis not present

## 2023-05-25 DIAGNOSIS — E785 Hyperlipidemia, unspecified: Secondary | ICD-10-CM | POA: Diagnosis not present

## 2023-05-25 DIAGNOSIS — N185 Chronic kidney disease, stage 5: Secondary | ICD-10-CM | POA: Diagnosis not present

## 2023-05-25 DIAGNOSIS — M109 Gout, unspecified: Secondary | ICD-10-CM | POA: Diagnosis not present

## 2023-05-25 DIAGNOSIS — Z6841 Body Mass Index (BMI) 40.0 and over, adult: Secondary | ICD-10-CM | POA: Diagnosis not present

## 2023-05-26 LAB — LAB REPORT - SCANNED: EGFR: 5

## 2023-06-02 ENCOUNTER — Other Ambulatory Visit: Payer: Self-pay | Admitting: Urology

## 2023-06-02 DIAGNOSIS — N281 Cyst of kidney, acquired: Secondary | ICD-10-CM

## 2023-06-07 ENCOUNTER — Other Ambulatory Visit: Payer: Self-pay | Admitting: Family Medicine

## 2023-06-07 DIAGNOSIS — I10 Essential (primary) hypertension: Secondary | ICD-10-CM

## 2023-06-08 DIAGNOSIS — I493 Ventricular premature depolarization: Secondary | ICD-10-CM

## 2023-06-08 DIAGNOSIS — I499 Cardiac arrhythmia, unspecified: Secondary | ICD-10-CM | POA: Diagnosis not present

## 2023-06-17 ENCOUNTER — Other Ambulatory Visit

## 2023-06-17 DIAGNOSIS — I10 Essential (primary) hypertension: Secondary | ICD-10-CM

## 2023-06-17 DIAGNOSIS — N184 Chronic kidney disease, stage 4 (severe): Secondary | ICD-10-CM

## 2023-06-17 DIAGNOSIS — N289 Disorder of kidney and ureter, unspecified: Secondary | ICD-10-CM

## 2023-06-17 DIAGNOSIS — E1122 Type 2 diabetes mellitus with diabetic chronic kidney disease: Secondary | ICD-10-CM | POA: Diagnosis not present

## 2023-06-18 LAB — COMPLETE METABOLIC PANEL WITH GFR
AG Ratio: 1.7 (calc) (ref 1.0–2.5)
ALT: 13 U/L (ref 6–29)
AST: 13 U/L (ref 10–35)
Albumin: 4.2 g/dL (ref 3.6–5.1)
Alkaline phosphatase (APISO): 79 U/L (ref 37–153)
BUN/Creatinine Ratio: 8 (calc) (ref 6–22)
BUN: 59 mg/dL — ABNORMAL HIGH (ref 7–25)
CO2: 20 mmol/L (ref 20–32)
Calcium: 8.7 mg/dL (ref 8.6–10.4)
Chloride: 113 mmol/L — ABNORMAL HIGH (ref 98–110)
Creat: 7.68 mg/dL — ABNORMAL HIGH (ref 0.50–1.05)
Globulin: 2.5 g/dL (ref 1.9–3.7)
Glucose, Bld: 91 mg/dL (ref 65–99)
Potassium: 3.7 mmol/L (ref 3.5–5.3)
Sodium: 144 mmol/L (ref 135–146)
Total Bilirubin: 0.4 mg/dL (ref 0.2–1.2)
Total Protein: 6.7 g/dL (ref 6.1–8.1)
eGFR: 6 mL/min/{1.73_m2} — ABNORMAL LOW (ref >=60)

## 2023-06-18 LAB — HEMOGLOBIN A1C
Hgb A1c MFr Bld: 5.3 %{Hb} (ref <5.7)
Mean Plasma Glucose: 105 mg/dL
eAG (mmol/L): 5.8 mmol/L

## 2023-06-18 LAB — PROTEIN / CREATININE RATIO, URINE
Creatinine, Urine: 160 mg/dL (ref 20–275)
Protein/Creat Ratio: 1113 mg/g{creat} — ABNORMAL HIGH (ref 24–184)
Protein/Creatinine Ratio: 1.113 mg/mg{creat} — ABNORMAL HIGH (ref 0.024–0.184)
Total Protein, Urine: 178 mg/dL — ABNORMAL HIGH (ref 5–24)

## 2023-06-18 LAB — CBC WITH DIFFERENTIAL/PLATELET
Absolute Lymphocytes: 1596 {cells}/uL (ref 850–3900)
Absolute Monocytes: 348 {cells}/uL (ref 200–950)
Basophils Absolute: 23 {cells}/uL (ref 0–200)
Basophils Relative: 0.4 %
Eosinophils Absolute: 131 {cells}/uL (ref 15–500)
Eosinophils Relative: 2.3 %
HCT: 34.1 % — ABNORMAL LOW (ref 35.0–45.0)
Hemoglobin: 10.4 g/dL — ABNORMAL LOW (ref 11.7–15.5)
MCH: 24.5 pg — ABNORMAL LOW (ref 27.0–33.0)
MCHC: 30.5 g/dL — ABNORMAL LOW (ref 32.0–36.0)
MCV: 80.4 fL (ref 80.0–100.0)
MPV: 10 fL (ref 7.5–12.5)
Monocytes Relative: 6.1 %
Neutro Abs: 3602 {cells}/uL (ref 1500–7800)
Neutrophils Relative %: 63.2 %
Platelets: 253 10*3/uL (ref 140–400)
RBC: 4.24 10*6/uL (ref 3.80–5.10)
RDW: 16.3 % — ABNORMAL HIGH (ref 11.0–15.0)
Total Lymphocyte: 28 %
WBC: 5.7 10*3/uL (ref 3.8–10.8)

## 2023-06-18 LAB — LIPID PANEL
Cholesterol: 122 mg/dL (ref <200)
HDL: 43 mg/dL — ABNORMAL LOW (ref >=50)
LDL Cholesterol (Calc): 60 mg/dL
Non-HDL Cholesterol (Calc): 79 mg/dL (ref <130)
Total CHOL/HDL Ratio: 2.8 (calc) (ref <5.0)
Triglycerides: 105 mg/dL (ref <150)

## 2023-06-18 LAB — VITAMIN D 25 HYDROXY (VIT D DEFICIENCY, FRACTURES): Vit D, 25-Hydroxy: 15 ng/mL — ABNORMAL LOW (ref 30–100)

## 2023-06-18 LAB — VITAMIN B12: Vitamin B-12: 812 pg/mL (ref 200–1100)

## 2023-06-19 ENCOUNTER — Other Ambulatory Visit: Payer: Medicaid Other

## 2023-06-22 ENCOUNTER — Encounter: Payer: Self-pay | Admitting: Family Medicine

## 2023-06-22 ENCOUNTER — Ambulatory Visit: Payer: Medicaid Other | Admitting: Family Medicine

## 2023-06-22 VITALS — BP 126/82 | HR 65 | Temp 97.8°F | Ht 61.0 in | Wt 208.2 lb

## 2023-06-22 DIAGNOSIS — Z0001 Encounter for general adult medical examination with abnormal findings: Secondary | ICD-10-CM | POA: Diagnosis not present

## 2023-06-22 DIAGNOSIS — M10379 Gout due to renal impairment, unspecified ankle and foot: Secondary | ICD-10-CM | POA: Diagnosis not present

## 2023-06-22 DIAGNOSIS — K5900 Constipation, unspecified: Secondary | ICD-10-CM | POA: Diagnosis not present

## 2023-06-22 DIAGNOSIS — N185 Chronic kidney disease, stage 5: Secondary | ICD-10-CM | POA: Diagnosis not present

## 2023-06-22 DIAGNOSIS — I10 Essential (primary) hypertension: Secondary | ICD-10-CM | POA: Diagnosis not present

## 2023-06-22 DIAGNOSIS — Z124 Encounter for screening for malignant neoplasm of cervix: Secondary | ICD-10-CM | POA: Diagnosis not present

## 2023-06-22 DIAGNOSIS — Z7985 Long-term (current) use of injectable non-insulin antidiabetic drugs: Secondary | ICD-10-CM | POA: Diagnosis not present

## 2023-06-22 DIAGNOSIS — E1122 Type 2 diabetes mellitus with diabetic chronic kidney disease: Secondary | ICD-10-CM

## 2023-06-22 DIAGNOSIS — Z0000 Encounter for general adult medical examination without abnormal findings: Secondary | ICD-10-CM | POA: Insufficient documentation

## 2023-06-22 MED ORDER — VITAMIN D3 25 MCG (1000 UT) PO CAPS: 1000.0000 [IU] | ORAL_CAPSULE | Freq: Every day | ORAL | 1 refills | Status: AC

## 2023-06-22 MED ORDER — DOCUSATE SODIUM 100 MG PO CAPS: 100.0000 mg | ORAL_CAPSULE | Freq: Two times a day (BID) | ORAL | 1 refills | Status: AC

## 2023-06-22 MED ORDER — IRON (FERROUS SULFATE) 325 (65 FE) MG PO TABS: 325.0000 mg | ORAL_TABLET | ORAL | 0 refills | Status: DC

## 2023-06-22 MED ORDER — ALLOPURINOL 100 MG PO TABS: 50.0000 mg | ORAL_TABLET | ORAL | 2 refills | Status: AC

## 2023-06-22 NOTE — Assessment & Plan Note (Signed)
 Chronic controlled. A1c 5.3%. Continue Semaglutide 0.5mg  weekly. A1c and uACR UTD. Foot exam UTD. Vaccines declind PNA. Retinal eye exam UTD. Recommend heart healthy diet such as Mediterranean diet with whole grains, fruits, vegetable, fish, lean meats, nuts, and olive oil. Limit salt. Encouraged moderate walking, 3-5 times/week for 30-50 minutes each session. Aim for at least 150 minutes.week. Goal should be pace of 3 miles/hours, or walking 1.5 miles in 30 minutes. Seek medical care for urinary frequency, extreme thirst, vision changes, lightheadedness, dizziness.  Follow up in 6 months or sooner if needed.

## 2023-06-22 NOTE — Assessment & Plan Note (Signed)
 Continue allopurinol. Renally dosed.

## 2023-06-22 NOTE — Assessment & Plan Note (Signed)
 Normal PAP, follow up as indicated by lab results.

## 2023-06-22 NOTE — Assessment & Plan Note (Signed)
 Chronic well controlled. Continue Amlodipine 10mg  daily, Coreg12.5mg  BID and Isosorbide-hydralazine 20-37.5 BID. Recommend heart healthy diet such as Mediterranean diet with whole grains, fruits, vegetable, fish, lean meats, nuts, and olive oil. Limit salt. Encouraged moderate walking, 3-5 times/week for 30-50 minutes each session. Aim for at least 150 minutes.week. Goal should be pace of 3 miles/hours, or walking 1.5 miles in 30 minutes. Avoid tobacco products. Avoid excess alcohol. Take medications as prescribed and bring medications and blood pressure log with cuff to each office visit. Seek medical care for chest pain, palpitations, shortness of breath with exertion, dizziness/lightheadedness, vision changes, recurrent headaches, or swelling of extremities. Keep appointment with Cardiology and follow-up with me in 6 months or sooner if needed.

## 2023-06-22 NOTE — Assessment & Plan Note (Signed)
 Followed by Nephrology. Plans for PD when indicated. GFR 6, creatinine 7.28.

## 2023-06-22 NOTE — Progress Notes (Signed)
 Subjective:   Margaret Aguilar is a 63 y.o. female for annual routine Pap and checkup. Current Outpatient Medications  Medication Sig Dispense Refill   acetaminophen (TYLENOL) 500 MG tablet Take 2 tablets (1,000 mg total) by mouth every 6 (six) hours as needed for moderate pain. 30 tablet 2   albuterol (PROVENTIL) (2.5 MG/3ML) 0.083% nebulizer solution Take 3 mLs (2.5 mg total) by nebulization every 6 (six) hours as needed for wheezing or shortness of breath. 150 mL 11   albuterol (VENTOLIN HFA) 108 (90 Base) MCG/ACT inhaler Inhale 2 puffs into the lungs every 6 (six) hours as needed for wheezing or shortness of breath. 8 g 0   amLODipine (NORVASC) 10 MG tablet TAKE 1 TABLET BY MOUTH EVERY DAY 90 tablet 0   calcitRIOL (ROCALTROL) 0.25 MCG capsule Take 1 capsule (0.25 mcg total) by mouth daily. 30 capsule 2   calcium carbonate (TUMS - DOSED IN MG ELEMENTAL CALCIUM) 500 MG chewable tablet Chew 2 tablets (400 mg of elemental calcium total) by mouth daily as needed for indigestion or heartburn. 60 tablet 2   carvedilol (COREG) 12.5 MG tablet Take 1 tablet (12.5 mg total) by mouth 2 (two) times daily with a meal. (Patient taking differently: Take 6.25 mg by mouth 2 (two) times daily with a meal.) 60 tablet 3   Cholecalciferol (VITAMIN D3) 25 MCG (1000 UT) CAPS Take 1 capsule (1,000 Units total) by mouth daily. 90 capsule 1   colchicine 0.6 MG tablet Take 0.3 mg once, and then every 3 days until gout flare is resolved (Patient taking differently: Take 0.3 mg by mouth See admin instructions. Take 0.3 mg once, and then every 3 days until gout flare is resolved as needed) 30 tablet 1   furosemide (LASIX) 40 MG tablet Take 1 tablet (40 mg total) by mouth daily. 30 tablet 2   isosorbide-hydrALAZINE (BIDIL) 20-37.5 MG tablet Take 1 tablet by mouth in the morning and at bedtime.     ondansetron (ZOFRAN) 4 MG tablet Take 1 tablet (4 mg total) by mouth every 8 (eight) hours as needed for nausea or vomiting. 20  tablet 0   rosuvastatin (CRESTOR) 10 MG tablet TAKE 1 TABLET BY MOUTH EVERY DAY 90 tablet 0   Semaglutide,0.25 or 0.5MG /DOS, (OZEMPIC, 0.25 OR 0.5 MG/DOSE,) 2 MG/3ML SOPN Inject 0.25 mg weekly for 4 weeks, then increase to 0.5 mg weekly 3 mL 2   allopurinol (ZYLOPRIM) 100 MG tablet Take 0.5 tablets (50 mg total) by mouth 2 (two) times a week. 15 tablet 2   docusate sodium (COLACE) 100 MG capsule Take 1 capsule (100 mg total) by mouth 2 (two) times daily. 10 capsule 1   Iron, Ferrous Sulfate, 325 (65 Fe) MG TABS Take 325 mg by mouth every other day. 45 tablet 0   montelukast (SINGULAIR) 10 MG tablet Take 1 tablet (10 mg total) by mouth at bedtime. 90 tablet 3   No current facility-administered medications for this visit.   Allergies: Nutritional supplements and Latex  No LMP recorded (lmp unknown). Patient is postmenopausal. 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) (HCC)    Palpitations    Pneumonia    several   Sleep apnea    does not tolerate CPAP   SOB (shortness of breath)    Vitamin D-dependent rickets    Past Surgical History:  Procedure Laterality Date   A/V FISTULAGRAM Left 02/24/2022   Procedure: A/V Fistulagram;  Surgeon: Maeola Harman, MD;  Location: [facility] INVASIVE CV LAB;  Service: Cardiovascular;  Laterality: Left;   AV FISTULA PLACEMENT Left 10/19/2018   Procedure: LEFT ARTERIOVENOUS (AV) FISTULA CREATION;  Surgeon: Maeola Harman, MD;  Location: [facility] OR;  Service: Vascular;  Laterality: Left;   BASCILIC VEIN TRANSPOSITION Left 06/30/2019   Procedure: Bascilic  Vein Transposition;  Surgeon: Maeola Harman, MD;  Location: [facility] OR;  Service: Vascular;  Laterality: Left;   CESAREAN SECTION     x 1   COLONOSCOPY N/A 01/12/2014   Procedure: COLONOSCOPY;  Surgeon: Malissa Hippo, MD;  Location: [facility] ENDO SUITE;  Service: Endoscopy;  Laterality: N/A;  225   D&C     FISTULA SUPERFICIALIZATION Left 03/28/2022   Procedure: LEFT ARM CEPHALIC VEIN FISTULA SUPERFICIALIZATION;  Surgeon: Maeola Harman, MD;  Location: [facility] OR;  Service: Vascular;  Laterality: Left;   TUBAL LIGATION     Health Maintenance  Topic Date Due   COVID-19 Vaccine (2 - Moderna risk series) 07/08/2023 (Originally 01/09/2020)   INFLUENZA VACCINE  07/13/2023 (Originally 11/13/2022)   Cervical Cancer Screening (HPV/Pap Cotest)  01/06/2024 (Originally 09/02/1992)   Pneumococcal Vaccine 64-56 Years old (1 of 2 - PCV) 06/21/2024 (Originally 09/02/1966)   HEMOGLOBIN A1C  12/18/2023   FOOT EXAM  12/30/2023   Colonoscopy  01/13/2024   OPHTHALMOLOGY EXAM  01/14/2024   Diabetic kidney evaluation - eGFR measurement  06/16/2024   Diabetic kidney evaluation - Urine ACR  06/16/2024   MAMMOGRAM  10/06/2024   DTaP/Tdap/Td (2 - Td or Tdap) 12/28/2029   Hepatitis C Screening  Completed   HIV Screening  Completed   Zoster Vaccines- Shingrix  Completed   HPV VACCINES  Aged Out     ROS:  Feeling well. No dyspnea or chest pain on exertion.  No abdominal pain, change in bowel habits, black or bloody stools.  No urinary tract symptoms. GYN ROS: normal menses, no abnormal bleeding, pelvic pain or discharge, no breast pain or new or enlarging lumps on self exam. No neurological complaints.  Objective:   The patient appears well, alert, oriented x 3, in no distress. BP 126/82   Pulse 65   Temp 97.8 F (36.6 C)   Ht 5\' 1"  (1.549 m)   Wt 208 lb 3.2 oz (94.4 kg)   LMP  (LMP Unknown)   SpO2 99%   BMI 39.34 kg/m  ENT normal.  Neck supple. No adenopathy or thyromegaly. PERLA. Lungs are  clear, good air entry, no wheezes, rhonchi or rales. S1 and S2 normal, no murmurs, irregular rate and rhythm. Abdomen soft without tenderness, guarding, mass or organomegaly. Extremities show no edema, normal peripheral pulses. Neurological is normal, no focal findings.  BREAST EXAM: breasts appear normal, no suspicious masses, no skin or nipple changes or axillary nodes  PELVIC EXAM: normal external genitalia, vulva, vagina, cervix, uterus and adnexa, PAP: Pap smear done today, exam chaperoned by Venia Carbon  Assessment & Plan:  well woman  PLAN:  mammogram pap smear return annually or prn    Physical exam, annual Assessment & Plan: Today your medical history was reviewed and routine physical exam with labs was performed. Recommend 150 minutes of moderate intensity exercise weekly and consuming a well-balanced diet. Advised to stop smoking if a smoker, avoid smoking if a non-smoker, limit alcohol consumption to 1 drink per day for women and 2 drinks per day for men, and avoid illicit drug use. Counseled on safe sex practices and offered STI testing today. Counseled in mental health awareness and when to seek medical care. Vaccine maintenance discussed. Appropriate health maintenance items reviewed. Return to office in 1 year for annual physical exam.    Acute gout due to renal impairment involving foot, unspecified laterality Assessment & Plan: Continue allopurinol. Renally dosed.   Orders: -     Allopurinol; Take 0.5 tablets (50 mg total) by mouth 2 (two) times a week.  Dispense: 15 tablet; Refill: 2  Constipation, unspecified constipation type -     Docusate Sodium; Take 1 capsule (100 mg total) by mouth 2 (two) times daily.  Dispense: 10 capsule; Refill: 1  Cervical cancer screening Assessment & Plan: Normal PAP, follow up as indicated by lab results.  Orders: -     Pap, TP Imaging w/ CT/GC and w/ HPV RNA, rflx HPV Type 16/18  Primary hypertension Assessment &  Plan: Chronic well controlled. Continue Amlodipine 10mg  daily, Coreg12.5mg  BID and Isosorbide-hydralazine 20-37.5 BID. Recommend heart healthy diet such as Mediterranean diet with whole grains, fruits, vegetable, fish, lean meats, nuts, and olive oil. Limit salt. Encouraged moderate walking, 3-5 times/week for 30-50 minutes each session. Aim for at least 150 minutes.week. Goal should be pace of 3 miles/hours, or walking 1.5 miles in 30 minutes. Avoid tobacco products. Avoid excess alcohol. Take medications as prescribed and bring medications and blood pressure log with cuff to each office visit. Seek medical care for chest pain, palpitations, shortness of breath with exertion, dizziness/lightheadedness, vision changes, recurrent headaches, or swelling of extremities. Keep appointment with Cardiology and follow-up with me in 6 months or sooner if needed.   Stage 5 chronic kidney disease not on chronic dialysis [facility]) Assessment & Plan: Followed by Nephrology. Plans for PD when indicated. GFR 6, creatinine 7.28.    Type 2 diabetes mellitus with stage 5 chronic kidney disease not on chronic dialysis, without long-term current use of insulin (HCC) Assessment & Plan: Chronic controlled. A1c 5.3%. Continue Semaglutide 0.5mg  weekly. A1c and uACR UTD. Foot exam UTD. Vaccines declind PNA. Retinal eye exam UTD. Recommend heart healthy diet such as Mediterranean diet with whole grains, fruits, vegetable, fish, lean meats, nuts, and olive oil. Limit salt. Encouraged moderate walking, 3-5 times/week for 30-50 minutes each session. Aim for at least 150 minutes.week. Goal should be pace of 3 miles/hours, or walking 1.5 miles in 30 minutes. Seek medical care for urinary frequency, extreme thirst, vision changes, lightheadedness, dizziness.  Follow up in 6 months or sooner if needed.    Other orders -     Vitamin D3; Take 1 capsule (1,000 Units total) by mouth daily.  Dispense: 90 capsule; Refill: 1 -     Iron  (Ferrous Sulfate); Take 325 mg by mouth every other day.  Dispense: 45 tablet; Refill: 0     Follow up plan: Return in about 6 months (around 12/23/2023) for chronic follow-up with labs 1 week prior.  Park Meo, FNP

## 2023-06-22 NOTE — Assessment & Plan Note (Signed)
 Today your medical history was reviewed and routine physical exam with labs was performed. Recommend 150 minutes of moderate intensity exercise weekly and consuming a well-balanced diet. Advised to stop smoking if a smoker, avoid smoking if a non-smoker, limit alcohol consumption to 1 drink per day for women and 2 drinks per day for men, and avoid illicit drug use. Counseled on safe sex practices and offered STI testing today. Counseled in mental health awareness and when to seek medical care. Vaccine maintenance discussed. Appropriate health maintenance items reviewed. Return to office in 1 year for annual physical exam.

## 2023-06-25 ENCOUNTER — Other Ambulatory Visit: Payer: Self-pay | Admitting: Family Medicine

## 2023-06-25 ENCOUNTER — Encounter: Payer: Self-pay | Admitting: Family Medicine

## 2023-06-25 DIAGNOSIS — I493 Ventricular premature depolarization: Secondary | ICD-10-CM

## 2023-06-25 LAB — C. TRACHOMATIS/N. GONORRHOEAE RNA
C. trachomatis RNA, TMA: NOT DETECTED
N. gonorrhoeae RNA, TMA: NOT DETECTED

## 2023-06-25 LAB — PAP, TP IMAGING W/ HPV RNA, RFLX HPV TYPE 16,18/45: HPV DNA High Risk: NOT DETECTED

## 2023-06-25 LAB — PAP, TP IMAGING W/ CT/GC AND W/ HPV RNA, RFLX HPV TYPE 16/18

## 2023-07-28 IMAGING — DX DG HAND COMPLETE 3+V*R*
3 series · 3 of 3 positions shown · non-contrast
Comparison: None.

CLINICAL DATA: Pain. Jammed RIGHT pinky last night while closing
her refrigerator.

EXAM:
RIGHT HAND - COMPLETE 3+ VIEW

[hand pa]
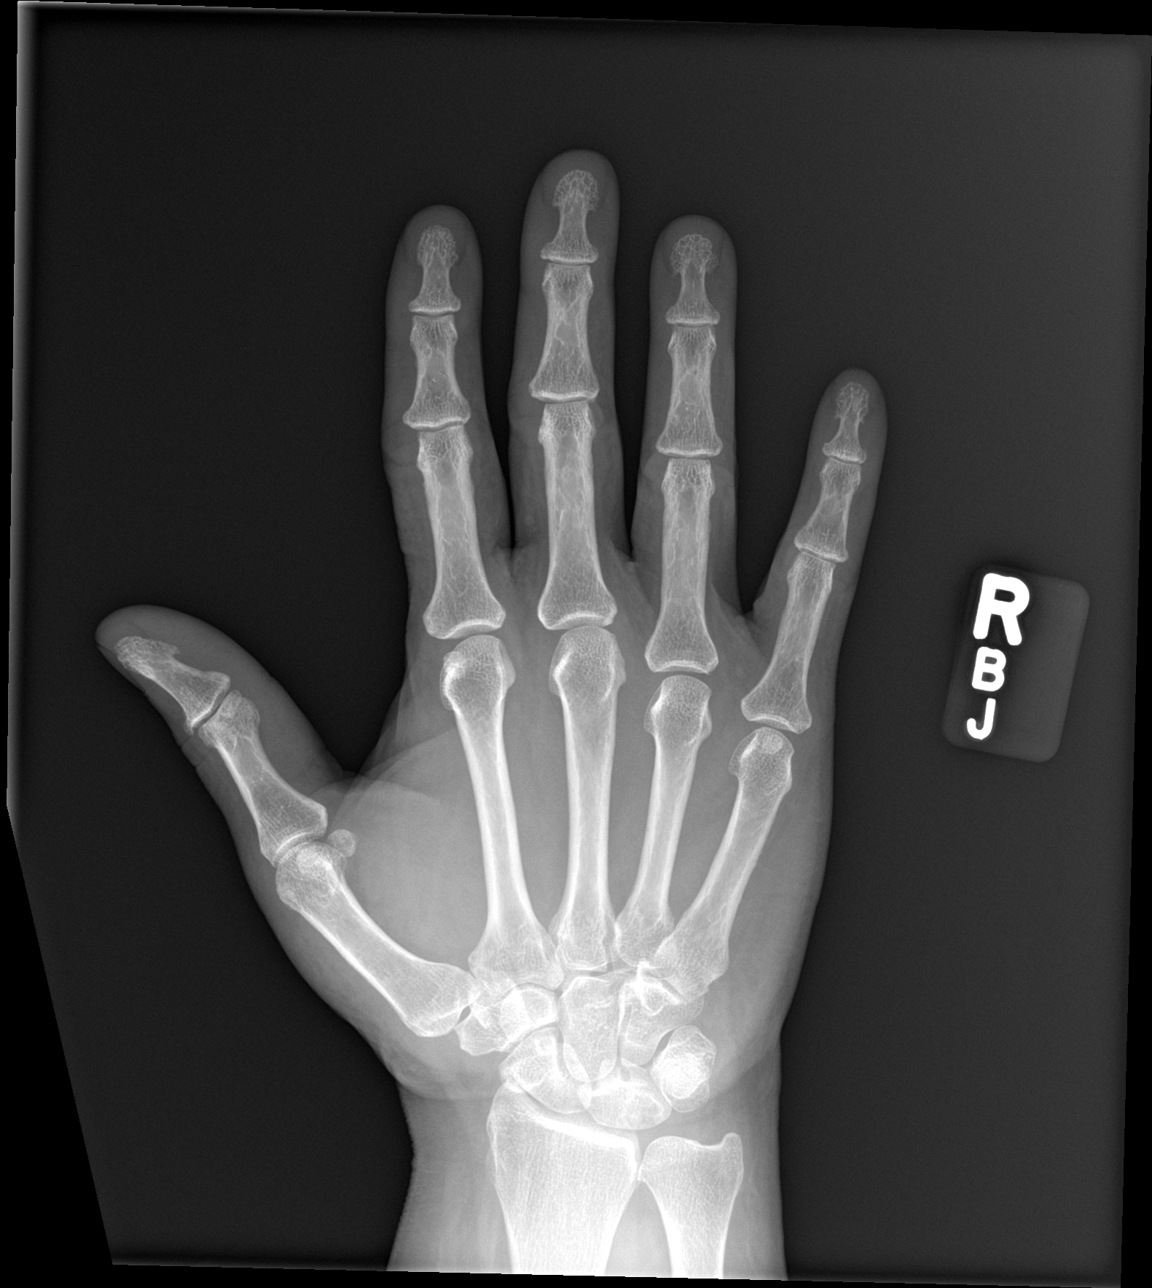

[hand obl]
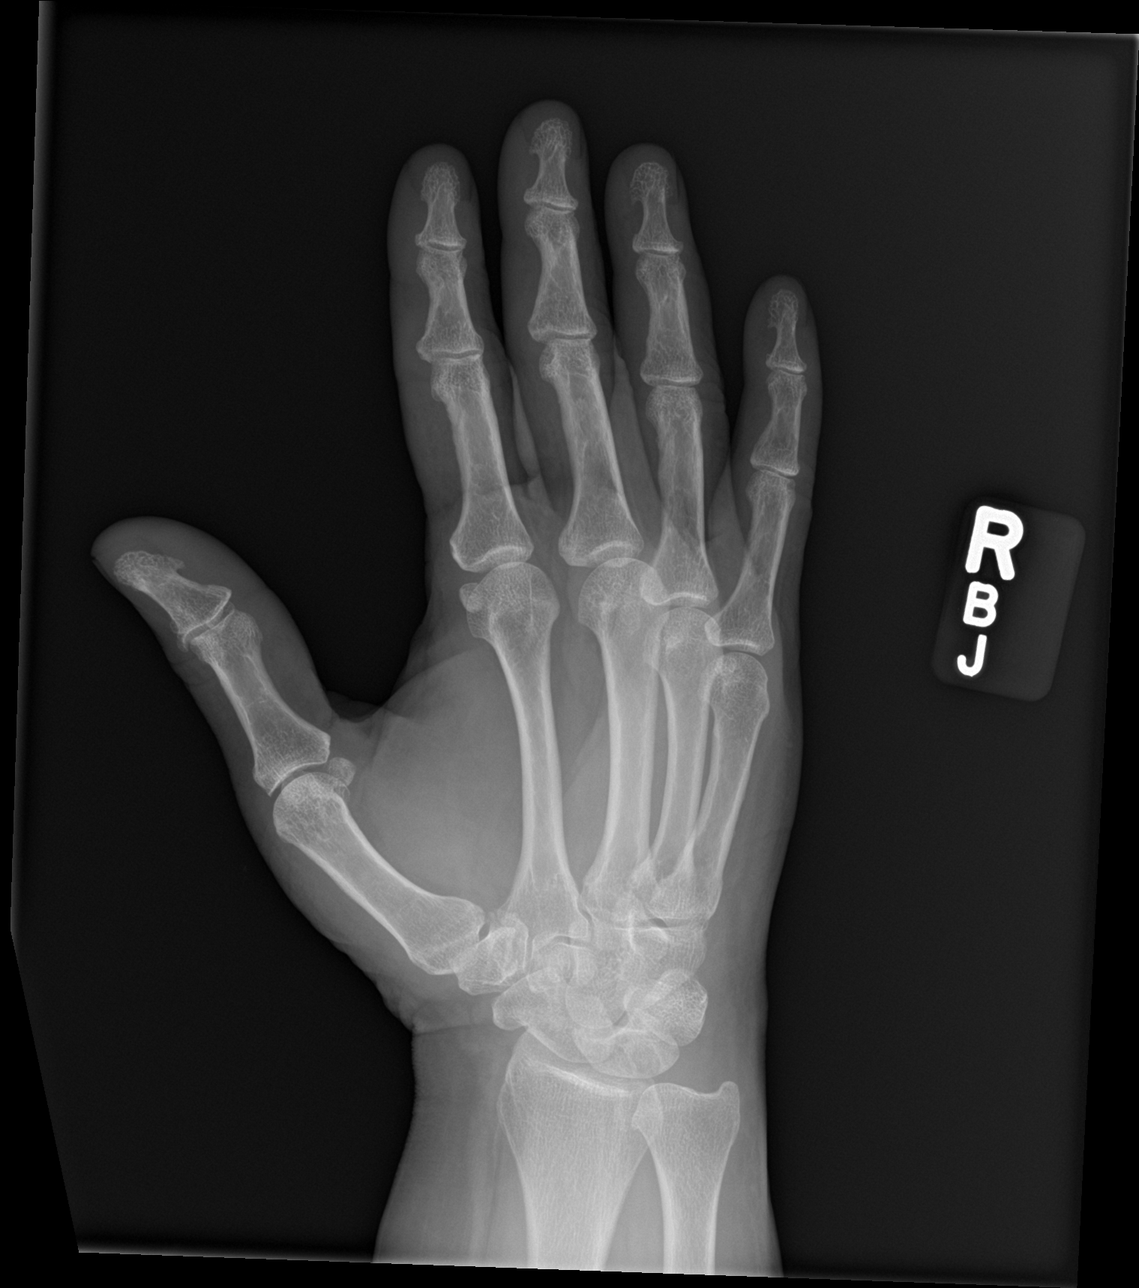

[hand lat]
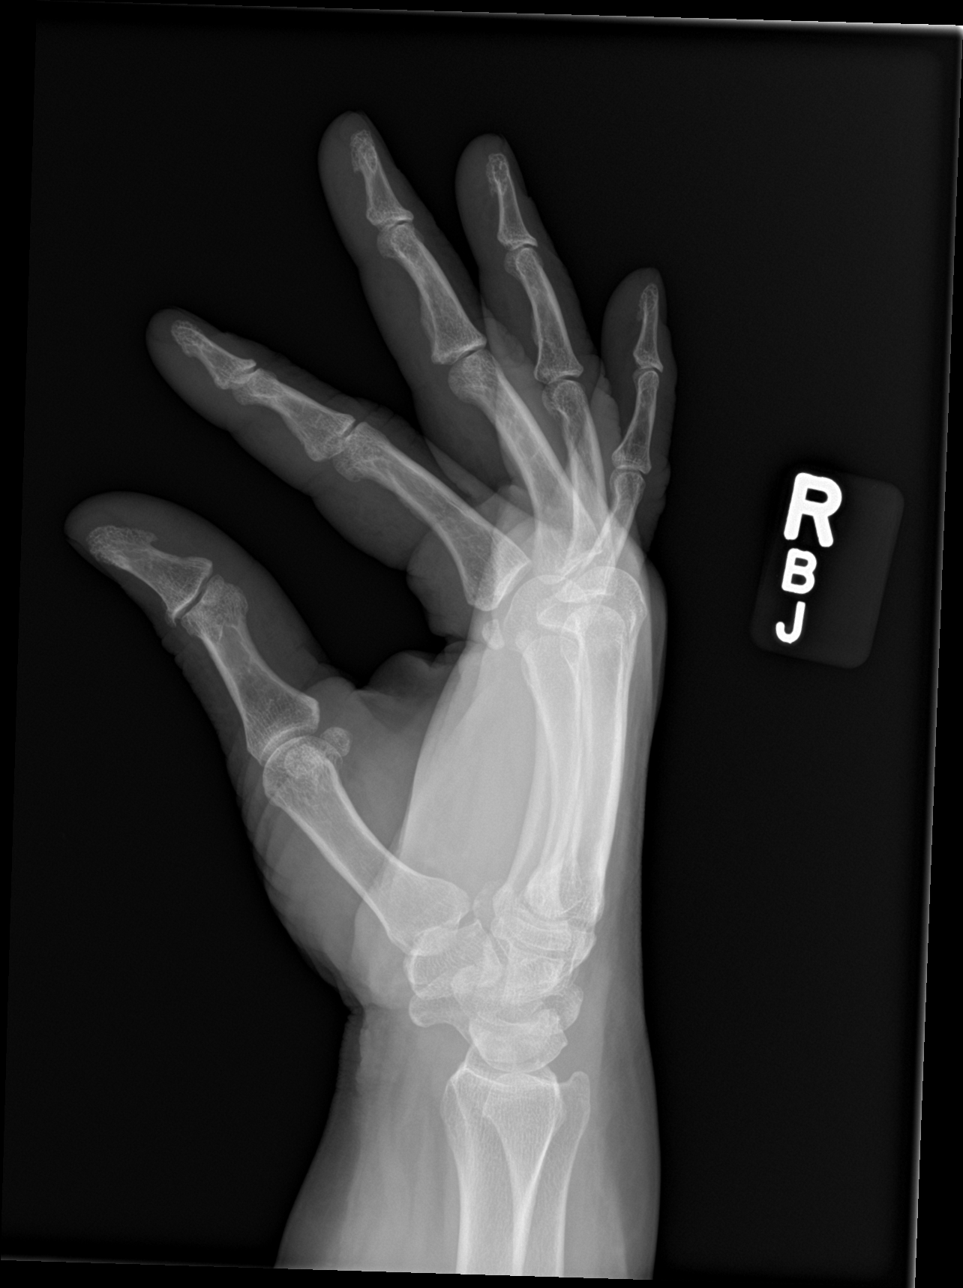

[3 of 3 positions shown; findings below may reference images not displayed]

FINDINGS: There is no evidence of fracture or dislocation. There is no
evidence of arthropathy or other focal bone abnormality. Soft
tissues are unremarkable.
IMPRESSION: Negative.

## 2023-08-19 ENCOUNTER — Other Ambulatory Visit: Payer: Self-pay | Admitting: Family Medicine

## 2023-08-19 DIAGNOSIS — I10 Essential (primary) hypertension: Secondary | ICD-10-CM

## 2023-09-14 DIAGNOSIS — E1129 Type 2 diabetes mellitus with other diabetic kidney complication: Secondary | ICD-10-CM | POA: Diagnosis not present

## 2023-09-14 DIAGNOSIS — M109 Gout, unspecified: Secondary | ICD-10-CM | POA: Diagnosis not present

## 2023-09-14 DIAGNOSIS — E785 Hyperlipidemia, unspecified: Secondary | ICD-10-CM | POA: Diagnosis not present

## 2023-09-14 DIAGNOSIS — E66811 Obesity, class 1: Secondary | ICD-10-CM | POA: Diagnosis not present

## 2023-09-14 DIAGNOSIS — I120 Hypertensive chronic kidney disease with stage 5 chronic kidney disease or end stage renal disease: Secondary | ICD-10-CM | POA: Diagnosis not present

## 2023-09-14 DIAGNOSIS — N2581 Secondary hyperparathyroidism of renal origin: Secondary | ICD-10-CM | POA: Diagnosis not present

## 2023-09-14 DIAGNOSIS — N189 Chronic kidney disease, unspecified: Secondary | ICD-10-CM | POA: Diagnosis not present

## 2023-09-14 DIAGNOSIS — I770 Arteriovenous fistula, acquired: Secondary | ICD-10-CM | POA: Diagnosis not present

## 2023-09-14 DIAGNOSIS — D631 Anemia in chronic kidney disease: Secondary | ICD-10-CM | POA: Diagnosis not present

## 2023-09-14 DIAGNOSIS — N185 Chronic kidney disease, stage 5: Secondary | ICD-10-CM | POA: Diagnosis not present

## 2023-09-14 DIAGNOSIS — N281 Cyst of kidney, acquired: Secondary | ICD-10-CM | POA: Diagnosis not present

## 2023-09-15 ENCOUNTER — Telehealth: Payer: Self-pay

## 2023-09-15 NOTE — Telephone Encounter (Signed)
 Copied from CRM [id number]. Topic: General - Other >> Sep 15, 2023  8:41 AM Marissa P wrote: Reason for CRM: Jah from [facility], needing the last office notes (starting from October of last year)  faxed over to [phone] please

## 2023-10-18 ENCOUNTER — Other Ambulatory Visit: Payer: Self-pay | Admitting: Family Medicine

## 2023-11-16 ENCOUNTER — Other Ambulatory Visit: Payer: Self-pay | Admitting: Family Medicine

## 2023-11-16 DIAGNOSIS — E1122 Type 2 diabetes mellitus with diabetic chronic kidney disease: Secondary | ICD-10-CM

## 2023-11-17 NOTE — Telephone Encounter (Signed)
 Called to inquire. No answer. Lvm for call back to update med list.

## 2023-11-19 ENCOUNTER — Other Ambulatory Visit: Payer: Self-pay

## 2023-11-19 ENCOUNTER — Telehealth: Payer: Self-pay

## 2023-11-19 DIAGNOSIS — E1122 Type 2 diabetes mellitus with diabetic chronic kidney disease: Secondary | ICD-10-CM

## 2023-11-19 MED ORDER — OZEMPIC (0.25 OR 0.5 MG/DOSE) 2 MG/3ML ~~LOC~~ SOPN: PEN_INJECTOR | SUBCUTANEOUS | 2 refills | Status: AC

## 2023-11-19 NOTE — Telephone Encounter (Signed)
 Sent in medication per provider.

## 2023-11-19 NOTE — Telephone Encounter (Signed)
 Copied from CRM [id number]. Topic: Clinical - Medication Question >> Nov 18, 2023  4:38 PM Tobias L wrote: Reason for CRM: Patient received text message inquiring if she is still taking the ozempic . Patient calling to advise she is still taking the ozempic . Patient took her last dose yesterday, 11/17/23.

## 2024-01-17 ENCOUNTER — Other Ambulatory Visit: Payer: Self-pay | Admitting: Family Medicine

## 2024-01-18 ENCOUNTER — Other Ambulatory Visit: Payer: Self-pay | Admitting: Family Medicine

## 2024-01-18 MED ORDER — ROSUVASTATIN CALCIUM 10 MG PO TABS: 10.0000 mg | ORAL_TABLET | Freq: Every day | ORAL | 0 refills | Status: AC

## 2024-02-01 DIAGNOSIS — D631 Anemia in chronic kidney disease: Secondary | ICD-10-CM | POA: Diagnosis not present

## 2024-02-01 DIAGNOSIS — E1129 Type 2 diabetes mellitus with other diabetic kidney complication: Secondary | ICD-10-CM | POA: Diagnosis not present

## 2024-02-01 DIAGNOSIS — N189 Chronic kidney disease, unspecified: Secondary | ICD-10-CM | POA: Diagnosis not present

## 2024-02-01 DIAGNOSIS — E66811 Obesity, class 1: Secondary | ICD-10-CM | POA: Diagnosis not present

## 2024-02-01 DIAGNOSIS — I770 Arteriovenous fistula, acquired: Secondary | ICD-10-CM | POA: Diagnosis not present

## 2024-02-01 DIAGNOSIS — E785 Hyperlipidemia, unspecified: Secondary | ICD-10-CM | POA: Diagnosis not present

## 2024-02-01 DIAGNOSIS — N2581 Secondary hyperparathyroidism of renal origin: Secondary | ICD-10-CM | POA: Diagnosis not present

## 2024-02-01 DIAGNOSIS — N281 Cyst of kidney, acquired: Secondary | ICD-10-CM | POA: Diagnosis not present

## 2024-02-01 DIAGNOSIS — M109 Gout, unspecified: Secondary | ICD-10-CM | POA: Diagnosis not present

## 2024-02-01 DIAGNOSIS — N185 Chronic kidney disease, stage 5: Secondary | ICD-10-CM | POA: Diagnosis not present

## 2024-02-01 DIAGNOSIS — I120 Hypertensive chronic kidney disease with stage 5 chronic kidney disease or end stage renal disease: Secondary | ICD-10-CM | POA: Diagnosis not present

## 2024-02-01 LAB — LAB REPORT - SCANNED: EGFR: 6

## 2024-02-16 ENCOUNTER — Ambulatory Visit: Payer: Self-pay

## 2024-02-16 NOTE — Telephone Encounter (Signed)
 FYI Only or Action Required?: FYI only for provider: appointment scheduled on 02/17/24.  Patient was last seen in primary care on 06/22/2023 by Kayla Jeoffrey RAMAN, FNP.  Called Nurse Triage reporting Breast Mass.  Symptoms began several days ago.  Interventions attempted: OTC medications: Tylenol .  Symptoms are: gradually worsening.  Triage Disposition: See Physician Within 24 Hours  Patient/caregiver understands and will follow disposition?: Yes  Copied from CRM [id number]. Topic: Clinical - Red Word Triage >> Feb 16, 2024 11:59 AM Margaret Aguilar wrote: Red Word that prompted transfer to Nurse Triage: knot on left breast that is red, swollen, and painful. Pain level 6 Reason for Disposition  [1] Breast looks infected (spreading redness, feels hot or painful to touch) AND [2] no fever  Answer Assessment - Initial Assessment Questions Reports onset on cyst on left breast 1 year ago. Was seen by provider and diagnosed as cyst, no drainage or intervention at that time per pt. Reports onset of pain, redness and swelling in same area on left breast since Thursday of last week, no fever or drainage. Appt scheduled with PCP for tomorrow. Advised UC or ED for worsening symptoms.  1. SYMPTOM: What's the main symptom you're concerned about?  (e.g., lump, nipple discharge, pain, rash)     Lump in left breast, painful and red and swollen. Size of a nickel.  2. LOCATION: Where is the Lump located?     Left breast  3. ONSET: When did left Breast lump  start?     Thursday last week  4. PRIOR HISTORY: Do you have any history of prior problems with your breasts? (e.g., breast cancer, breast implant, fibrocystic breast disease)     History of breast cyst 1 year ago on left breast that never went away. Was evaluated by a provider and it was diagnosed as cyst. Was never drained.  5. CAUSE: What do you think is causing this symptom?     Possibly r/t cyst  6. OTHER SYMPTOMS: Do you have any other  symptoms? (e.g., breast pain, fever, nipple discharge, redness or rash)     Breast pain 6/10, redness , swelling.  Protocols used: Breast Symptoms-A-AH

## 2024-02-17 ENCOUNTER — Ambulatory Visit: Admitting: Family Medicine
